# Patient Record
Sex: Female | Born: 1980 | Hispanic: Yes | Marital: Single | Smoking: Never smoker
Health system: Southern US, Community
[De-identification: ages and names within clinical notes are randomized; demographics above are authoritative.]

## PROBLEM LIST (undated history)

## (undated) ENCOUNTER — Inpatient Hospital Stay (HOSPITAL_COMMUNITY): Payer: Self-pay

## (undated) DIAGNOSIS — R079 Chest pain, unspecified: Secondary | ICD-10-CM

## (undated) DIAGNOSIS — E079 Disorder of thyroid, unspecified: Secondary | ICD-10-CM

## (undated) DIAGNOSIS — I8390 Asymptomatic varicose veins of unspecified lower extremity: Secondary | ICD-10-CM

## (undated) DIAGNOSIS — N390 Urinary tract infection, site not specified: Secondary | ICD-10-CM

## (undated) DIAGNOSIS — E039 Hypothyroidism, unspecified: Secondary | ICD-10-CM

## (undated) DIAGNOSIS — IMO0002 Reserved for concepts with insufficient information to code with codable children: Secondary | ICD-10-CM

## (undated) DIAGNOSIS — M329 Systemic lupus erythematosus, unspecified: Secondary | ICD-10-CM

## (undated) DIAGNOSIS — D649 Anemia, unspecified: Secondary | ICD-10-CM

## (undated) DIAGNOSIS — G43909 Migraine, unspecified, not intractable, without status migrainosus: Secondary | ICD-10-CM

## (undated) HISTORY — DX: Asymptomatic varicose veins of unspecified lower extremity: I83.90

## (undated) HISTORY — DX: Systemic lupus erythematosus, unspecified: M32.9

## (undated) HISTORY — PX: TONSILLECTOMY: SUR1361

## (undated) HISTORY — DX: Chest pain, unspecified: R07.9

## (undated) HISTORY — DX: Anemia, unspecified: D64.9

## (undated) HISTORY — DX: Disorder of thyroid, unspecified: E07.9

## (undated) HISTORY — PX: APPENDECTOMY: SHX54

## (undated) HISTORY — PX: TONSILLECTOMY AND ADENOIDECTOMY: SHX28

## (undated) HISTORY — DX: Urinary tract infection, site not specified: N39.0

## (undated) HISTORY — DX: Migraine, unspecified, not intractable, without status migrainosus: G43.909

---
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
## 2006-11-11 ENCOUNTER — Ambulatory Visit: Payer: Self-pay | Admitting: Family Medicine

## 2006-11-11 LAB — CONVERTED CEMR LAB
T3, Free: 3.1 pg/mL (ref 2.3–4.2)
TSH: 9.52 microintl units/mL — ABNORMAL HIGH (ref 0.35–5.50)

## 2007-01-19 ENCOUNTER — Ambulatory Visit: Payer: Self-pay | Admitting: Family Medicine

## 2007-02-17 ENCOUNTER — Ambulatory Visit: Payer: Self-pay | Admitting: Family Medicine

## 2007-02-17 LAB — CONVERTED CEMR LAB: TSH: 2.05 microintl units/mL (ref 0.35–5.50)

## 2007-03-04 ENCOUNTER — Inpatient Hospital Stay (HOSPITAL_COMMUNITY): Admission: AD | Admit: 2007-03-04 | Discharge: 2007-03-05 | Payer: Self-pay | Admitting: Obstetrics and Gynecology

## 2007-03-11 ENCOUNTER — Inpatient Hospital Stay (HOSPITAL_COMMUNITY): Admission: AD | Admit: 2007-03-11 | Discharge: 2007-03-11 | Payer: Self-pay | Admitting: Obstetrics and Gynecology

## 2011-01-02 ENCOUNTER — Emergency Department (HOSPITAL_COMMUNITY)
Admission: EM | Admit: 2011-01-02 | Discharge: 2011-01-02 | Disposition: A | Discharge status: Home / Self Care | Payer: Self-pay | Attending: Emergency Medicine | Admitting: Emergency Medicine

## 2011-01-02 DIAGNOSIS — E039 Hypothyroidism, unspecified: Secondary | ICD-10-CM | POA: Insufficient documentation

## 2011-01-02 DIAGNOSIS — G43909 Migraine, unspecified, not intractable, without status migrainosus: Secondary | ICD-10-CM | POA: Insufficient documentation

## 2011-02-15 NOTE — Assessment & Plan Note (Signed)
Debbie [name] OFFICE NOTE   NAME:Debbie [name], OTILLIA                          MRN:          [id number]  DATE:11/11/2006                            DOB:          03/09/1981    This is a 30 year old woman here to establish with our practice.  She is  also asking for me to evaluate her thyroid gland.  Over the past 6  months or so she has noticed an increasing frequency in her headaches,  some generalized fatigue, and some mild weight gain.  No other specific  symptoms have been noted.  She just moved to [place] from [place],  [place] earlier this year.  About 2 weeks ago she had some screening  laboratories done by her physician in [place] and was told that her TSH  was very high and that she would need to be evaluated by her new  doctor once she arrived here.  Apparently the remainder of her blood  work was within normal limits.   OTHER PAST MEDICAL HISTORY:  She has been treated for depression in the  past but says this is now well controlled.  She had asthma as a child  but grew out of it.  She has a history of migraine headaches and was  given Imitrex to take by her previous physician.  She has a history of  anemia which apparently is stable.  She has had an appendectomy and a  tonsillectomy.  Four years ago she fell and injured her left knee,  apparently some significant cartilage damage was done at that time.  She  has been followed by Dr. Darlina Rumpf who has actually recommended  arthroscopic surgery to the knee, however, since it does not bother her  that much she is trying to put this off as long as possible.  She is a  G3 P1, last Pap smear was about a year ago.  All of her Pap smears have  been normal.   ALLERGIES:  SULFA.   CURRENT MEDICATIONS:  Imitrex tablets as needed.   HABITS:  She drinks some alcohol, she does not use tobacco.   SOCIAL HISTORY:  She is single, she works in Audiological scientist.   FAMILY  HISTORY:  Remarkable for thyroid problems in an aunt, also  hypertension and diabetes.   OBJECTIVE:  Height 5 foot 2 inches, weight 170, blood pressure 112/68,  pulse 72 and regular.  GENERAL:  She is fairly overweight.  NECK:  Supple without masses or thyromegaly.   ASSESSMENT/PLAN:  1. Introductory visit for this patient who probably has hypothyroidism      from her description.      Will get a thyroid panel including a TSH, a Free T3, and a Free T4,      and will evaluate      further.  2. Migraine headaches, apparently stable.     Tera Mater. Clent Ridges, MD  Electronically Signed    SAF/MedQ  DD: 11/12/2006  DT: 11/12/2006  Job #: [id number]

## 2011-02-18 ENCOUNTER — Encounter: Payer: Self-pay | Admitting: Family Medicine

## 2011-02-18 ENCOUNTER — Ambulatory Visit (INDEPENDENT_AMBULATORY_CARE_PROVIDER_SITE_OTHER): Payer: BC Managed Care – PPO | Admitting: Family Medicine

## 2011-02-18 DIAGNOSIS — J309 Allergic rhinitis, unspecified: Secondary | ICD-10-CM

## 2011-02-18 DIAGNOSIS — E039 Hypothyroidism, unspecified: Secondary | ICD-10-CM

## 2011-02-18 DIAGNOSIS — G43909 Migraine, unspecified, not intractable, without status migrainosus: Secondary | ICD-10-CM | POA: Insufficient documentation

## 2011-02-18 DIAGNOSIS — J3089 Other allergic rhinitis: Secondary | ICD-10-CM | POA: Insufficient documentation

## 2011-02-18 MED ORDER — LEVOTHYROXINE SODIUM 125 MCG PO TABS: 125.0000 ug | ORAL_TABLET | Freq: Every day | ORAL | Status: DC

## 2011-02-18 MED ORDER — AZELASTINE HCL 0.1 % NA SOLN: 1.0000 | Freq: Two times a day (BID) | NASAL | Status: DC

## 2011-02-18 MED ORDER — LEVOTHYROXINE SODIUM 125 MCG PO CAPS: 1.0000 | ORAL_CAPSULE | ORAL | Status: DC

## 2011-02-18 NOTE — Patient Instructions (Signed)
Relpax one at onset of migraine and may repeat once in 2 hours but no more than 2 in 24 hours.

## 2011-02-18 NOTE — Progress Notes (Signed)
  Subjective:    Patient ID: Debbie [name], female    DOB: 1981-09-23, 30 y.o.   MRN: [id number]  HPI Patient is seen to establish care. She has remote history of anemia and depression. She has history of hypothyroidism. Had been on Synthroid 125 mcg daily and had run out of insurance and has not been taking for 3 months now. Compliant prior to that time. Thyroid reportedly controlled at that level.  Also has history of migraine headaches. No clear trigger. Usually has one or 2 per month. The pain is throbbing and sometimes associated with nausea and vomiting. Has tried Motrin without relief.  Patient has seasonal and perennial allergies. Has used Claritin, Zyrtec, and Flonase without much improvement. Frequent sneezing and clear rhinorrhea. Occasional eye symptoms. No recent fever or chills.  Some recent weight gain possibly related to hypothyroidism. No dietary changes. No consistent exercise.  Family history significant for father having prostate cancer. Mother and father with hypertension. Father type 2 diabetes.  Patient is single. 2 children ages 78 and 2. Nonsmoker. Occasional alcohol use.   Review of Systems  Constitutional: Positive for fatigue and unexpected weight change. Negative for fever, chills, activity change and appetite change.  HENT: Positive for congestion, sneezing, postnasal drip and sinus pressure. Negative for sore throat and voice change.   Respiratory: Negative for cough, shortness of breath and wheezing.   Cardiovascular: Negative for chest pain, palpitations and leg swelling.  Gastrointestinal: Negative for abdominal pain.  Genitourinary: Negative for dysuria.  Neurological: Negative for dizziness, syncope and headaches.  Hematological: Negative for adenopathy. Does not bruise/bleed easily.  Psychiatric/Behavioral: Negative for dysphoric mood.       Objective:   Physical Exam  Constitutional: She appears well-developed and well-nourished.  HENT:  Right Ear:  External ear normal.  Left Ear: External ear normal.  Mouth/Throat: Oropharynx is clear and moist. No oropharyngeal exudate.  Neck: Neck supple. No thyromegaly present.  Cardiovascular: Normal rate, regular rhythm and normal heart sounds.   No murmur heard. Pulmonary/Chest: Effort normal and breath sounds normal. No respiratory distress. She has no wheezes.  Lymphadenopathy:    She has no cervical adenopathy.  Psychiatric: She has a normal mood and affect.          Assessment & Plan:  #1 hypothyroidism. Get back on thyroid replacement and recheck levels in 3 months #2 migraine headaches. Samples of Relpax 40 mg one at onset of migraine a repeat one in 2 hours as needed maximum 2 in 24 hours. #3 seasonal and perennial allergies.  Astelin nasal one spray per nostril twice daily #4 Weight gain- schedule CPE.  Discussed diet and exercise.

## 2011-03-25 ENCOUNTER — Telehealth: Payer: Self-pay | Admitting: Family Medicine

## 2011-03-25 NOTE — Telephone Encounter (Signed)
Pt had some labs drawn at work for [employer] and some of results were abnormal. Pt req call back from pcp.

## 2011-03-25 NOTE — Telephone Encounter (Signed)
Message left on pt personally identified VM that she may fax lab results to [phone] and attn: Dr Caryl Never and he can give his opinion.

## 2011-04-10 ENCOUNTER — Emergency Department (HOSPITAL_BASED_OUTPATIENT_CLINIC_OR_DEPARTMENT_OTHER)
Admission: EM | Admit: 2011-04-10 | Discharge: 2011-04-11 | Disposition: A | Discharge status: Home / Self Care | Payer: BC Managed Care – PPO | Attending: Emergency Medicine | Admitting: Emergency Medicine

## 2011-04-10 ENCOUNTER — Encounter (HOSPITAL_BASED_OUTPATIENT_CLINIC_OR_DEPARTMENT_OTHER): Payer: Self-pay | Admitting: *Deleted

## 2011-04-10 DIAGNOSIS — R1110 Vomiting, unspecified: Secondary | ICD-10-CM | POA: Insufficient documentation

## 2011-04-10 DIAGNOSIS — R109 Unspecified abdominal pain: Secondary | ICD-10-CM | POA: Insufficient documentation

## 2011-04-10 DIAGNOSIS — K5289 Other specified noninfective gastroenteritis and colitis: Secondary | ICD-10-CM

## 2011-04-10 DIAGNOSIS — R197 Diarrhea, unspecified: Secondary | ICD-10-CM | POA: Insufficient documentation

## 2011-04-10 LAB — URINALYSIS, ROUTINE W REFLEX MICROSCOPIC
Bilirubin Urine: NEGATIVE
Glucose, UA: NEGATIVE mg/dL
Ketones, ur: NEGATIVE mg/dL
Protein, ur: NEGATIVE mg/dL
pH: 6 (ref 5.0–8.0)

## 2011-04-10 LAB — URINE MICROSCOPIC-ADD ON

## 2011-04-10 LAB — PREGNANCY, URINE: Preg Test, Ur: NEGATIVE

## 2011-04-10 MED ORDER — SODIUM CHLORIDE 0.9 % IV BOLUS (SEPSIS)
1000.0000 mL | Freq: Once | INTRAVENOUS | Status: AC
  Administered 2011-04-10: 1000 mL via INTRAVENOUS

## 2011-04-10 NOTE — ED Notes (Signed)
Pt c/o generalized abd pain with loose stools x 2 days

## 2011-04-11 LAB — COMPREHENSIVE METABOLIC PANEL
ALT: 6 U/L (ref 0–35)
BUN: 14 mg/dL (ref 6–23)
CO2: 25 mEq/L (ref 19–32)
Calcium: 9 mg/dL (ref 8.4–10.5)
Creatinine, Ser: 0.7 mg/dL (ref 0.50–1.10)
GFR calc Af Amer: >60 mL/min (ref >60)
GFR calc non Af Amer: >60 mL/min (ref >60)
Glucose, Bld: 121 mg/dL — ABNORMAL HIGH (ref 70–99)
Sodium: 137 mEq/L (ref 135–145)

## 2011-04-11 LAB — CBC
HCT: 31.1 % — ABNORMAL LOW (ref 36.0–46.0)
Hemoglobin: 10.9 g/dL — ABNORMAL LOW (ref 12.0–15.0)
MCH: 21.9 pg — ABNORMAL LOW (ref 26.0–34.0)
MCHC: 35 g/dL (ref 30.0–36.0)

## 2011-04-11 LAB — DIFFERENTIAL
Basophils Relative: 0 % (ref 0–1)
Eosinophils Absolute: 0.3 10*3/uL (ref 0.0–0.7)
Lymphs Abs: 2.7 10*3/uL (ref 0.7–4.0)
Monocytes Absolute: 1 10*3/uL (ref 0.1–1.0)
Neutro Abs: 5.1 10*3/uL (ref 1.7–7.7)

## 2011-04-11 MED ORDER — MORPHINE SULFATE 4 MG/ML IJ SOLN
4.0000 mg | Freq: Once | INTRAMUSCULAR | Status: AC
  Administered 2011-04-11: 4 mg via INTRAVENOUS

## 2011-04-11 MED ORDER — ONDANSETRON HCL 4 MG/2ML IJ SOLN
4.0000 mg | Freq: Once | INTRAMUSCULAR | Status: AC
  Administered 2011-04-11: 4 mg via INTRAVENOUS

## 2011-04-11 MED ORDER — MORPHINE SULFATE 4 MG/ML IJ SOLN
INTRAMUSCULAR | Status: AC
  Administered 2011-04-11: 4 mg via INTRAVENOUS
  Filled 2011-04-11: qty 1

## 2011-04-11 MED ORDER — ONDANSETRON HCL 4 MG PO TABS: 4.0000 mg | ORAL_TABLET | Freq: Four times a day (QID) | ORAL | Status: DC

## 2011-04-11 MED ORDER — ONDANSETRON HCL 4 MG/2ML IJ SOLN
INTRAMUSCULAR | Status: AC
  Administered 2011-04-11: 4 mg
  Filled 2011-04-11: qty 2

## 2011-04-11 MED ORDER — SODIUM CHLORIDE 0.9 % IV BOLUS (SEPSIS)
1000.0000 mL | Freq: Once | INTRAVENOUS | Status: AC
  Administered 2011-04-11: 1000 mL via INTRAVENOUS

## 2011-04-11 MED ORDER — MORPHINE SULFATE 4 MG/ML IJ SOLN
INTRAMUSCULAR | Status: AC
  Administered 2011-04-11: 4 mg
  Filled 2011-04-11: qty 1

## 2011-04-11 MED ORDER — ONDANSETRON HCL 4 MG PO TABS: 4.0000 mg | ORAL_TABLET | Freq: Four times a day (QID) | ORAL | Status: AC

## 2011-04-11 MED ORDER — MORPHINE SULFATE 4 MG/ML IJ SOLN
INTRAMUSCULAR | Status: AC
  Filled 2011-04-11: qty 1

## 2011-04-11 MED ORDER — ONDANSETRON HCL 4 MG/2ML IJ SOLN
INTRAMUSCULAR | Status: AC
  Administered 2011-04-11: 4 mg via INTRAVENOUS
  Filled 2011-04-11: qty 2

## 2011-04-11 NOTE — ED Provider Notes (Signed)
History     Chief Complaint  Patient presents with  . Abdominal Pain   HPI History of Present Illness   Patient Identification Debbie [name] is a 30 y.o. female.  Patient information was obtained from patient. History/Exam limitations: none. Patient presented to the Emergency Department ambulatory.  Chief Complaint  Abdominal Pain   Patient presents for evaluation of abdominal pain, cramping, vomiting and diarrhea. Onset of symptoms was abrupt starting several hours ago, and has been stable since that time. Other family members affected - patient and none. There is no prior history of gastrointestinal disease. No known risk factors for parasitic or bacterial infection. Pt has had multiple episodes of emesis- nonbloody/nonbilious, loose stools- no blood or mucous.  Pt has cramping abdominal pain that is temporarily relieved by vomiting or passing stool.  No fever.  Did try pepto bismol which did not help with symptoms  Past Medical History  Diagnosis Date  . Anemia   . Depression   . Migraines   . Thyroid disease   . UTI (urinary tract infection)   . Migraine    Family History  Problem Relation Age of Onset  . Arthritis Neg Hx     family  . Hypertension Mother   . Hyperlipidemia Father   . Diabetes Father   . Hypertension Father    Scheduled Meds:   Continuous Infusions:   PRN Meds:    Allergies  Allergen Reactions  . Aspirin Hives   History   Social History  . Marital Status: Single    Spouse Name: N/A    Number of Children: N/A  . Years of Education: N/A   Occupational History  . Not on file.   Social History Main Topics  . Smoking status: Never Smoker   . Smokeless tobacco: Not on file  . Alcohol Use: No  . Drug Use: No  . Sexually Active:    Other Topics Concern  . Not on file   Social History Narrative  . No narrative on file   Review of Systems Pertinent items are noted in HPI.  10 Systems reviewed and are negative for acute change  except as noted in the HPI.  Physical Exam   BP 98/54  Pulse 75  Temp(Src) 98.9 F (37.2 C) (Oral)  Resp 18  Wt 175 lb (79.379 kg)  SpO2 100%  LMP 03/19/2011 Vital signs reviewed BP 98/54  Pulse 75  Temp(Src) 98.9 F (37.2 C) (Oral)  Resp 18  Wt 175 lb (79.379 kg)  SpO2 100%  LMP 03/19/2011 General appearance: alert, appears stated age, no distress and tired appearing Eyes: negative findings: conjunctivae and sclerae normal, pupils equal, round, reactive to light and accomodation and no scleral icterus Throat: normal findings: palate normal, tongue midline and normal and soft palate, uvula, and tonsils normal, mucous membranes moist Neck: supple, from Lungs: clear to auscultation bilaterally Heart: regular rate and rhythm, S1, S2 normal, no murmur, click, rub or gallop Abdomen: soft, non-tender; bowel sounds normal; no masses,  no organomegaly Extremities: extremities normal, atraumatic, no cyanosis or edema Pulses: 2+ and symmetric Skin: Skin color, texture, turgor normal. No rashes or lesions Neurologic: Alert and oriented X 3, normal strength and tone. Normal symmetric reflexes. Normal coordination and gait  ED Course     Records Reviewed: Nursing notes.  Treatments: Antiemetics given. Intravenous 0.9NS bolus of 2000 ml given. Oral fluids given and tolerated.  Disposition: Home  Past Medical History  Diagnosis Date  . Anemia   .  Depression   . Migraines   . Thyroid disease   . UTI (urinary tract infection)   . Migraine     Past Surgical History  Procedure Date  . Appendectomy   . Tonsillectomy and adenoidectomy   . Tonsillectomy 2001  . Tonsillectomy     Family History  Problem Relation Age of Onset  . Arthritis Neg Hx     family  . Hypertension Mother   . Hyperlipidemia Father   . Diabetes Father   . Hypertension Father     History  Substance Use Topics  . Smoking status: Never Smoker   . Smokeless tobacco: Not on file  . Alcohol Use: No     OB History    Grav Para Term Preterm Abortions TAB SAB Ect Mult Living                  Review of Systems  Physical Exam  BP 117/69  Pulse 80  Temp(Src) 98.9 F (37.2 C) (Oral)  Resp 16  Wt 175 lb (79.379 kg)  SpO2 100%  LMP 03/19/2011  Physical Exam  ED Course  Procedures Urine sample appears contaminated, mild hyperglycemia, mild anemia- no prior values to compare, otherwise labs reassuring Pt has received 2 L IV hydration.  Will attempt po challenge Pt had vomiting after drinking small amount of sprite- will continue hydration and another dose of antiemetics Pt now feeling improved, has tolerated fluid trial.  MDM Acute onset of nausea/vomiting/diarrhea, cramping abdominal pain.  Abdomen nontender to palpation.  Labs reassuring, suspect gastroenteritis, doubt SBO or other acute/serious process. Pt discharged with strict return precautions.  Pt agreeable with plan.

## 2011-05-02 ENCOUNTER — Encounter (HOSPITAL_BASED_OUTPATIENT_CLINIC_OR_DEPARTMENT_OTHER): Payer: Self-pay | Admitting: *Deleted

## 2011-05-02 ENCOUNTER — Emergency Department (HOSPITAL_BASED_OUTPATIENT_CLINIC_OR_DEPARTMENT_OTHER)
Admission: EM | Admit: 2011-05-02 | Discharge: 2011-05-02 | Disposition: A | Discharge status: Home / Self Care | Payer: BC Managed Care – PPO | Attending: Emergency Medicine | Admitting: Emergency Medicine

## 2011-05-02 DIAGNOSIS — N390 Urinary tract infection, site not specified: Secondary | ICD-10-CM

## 2011-05-02 DIAGNOSIS — R300 Dysuria: Secondary | ICD-10-CM | POA: Insufficient documentation

## 2011-05-02 LAB — URINE MICROSCOPIC-ADD ON

## 2011-05-02 LAB — URINALYSIS, ROUTINE W REFLEX MICROSCOPIC
Glucose, UA: NEGATIVE mg/dL
Protein, ur: NEGATIVE mg/dL

## 2011-05-02 LAB — PREGNANCY, URINE: Preg Test, Ur: NEGATIVE

## 2011-05-02 MED ORDER — IBUPROFEN 800 MG PO TABS
800.0000 mg | ORAL_TABLET | Freq: Once | ORAL | Status: AC
  Administered 2011-05-02: 800 mg via ORAL
  Filled 2011-05-02: qty 1

## 2011-05-02 MED ORDER — NITROFURANTOIN MONOHYD MACRO 100 MG PO CAPS: 100.0000 mg | ORAL_CAPSULE | Freq: Two times a day (BID) | ORAL | Status: DC

## 2011-05-02 MED ORDER — NITROFURANTOIN MONOHYD MACRO 100 MG PO CAPS: 100.0000 mg | ORAL_CAPSULE | Freq: Two times a day (BID) | ORAL | Status: AC

## 2011-05-02 NOTE — ED Provider Notes (Signed)
Medical screening examination/treatment/procedure(s) were performed by non-physician practitioner and as supervising physician I was immediately available for consultation/collaboration.   Charles B. Sheldon, MD 05/02/11 [id number] 

## 2011-05-02 NOTE — ED Notes (Signed)
Dysuria x 3 days 

## 2011-05-02 NOTE — ED Provider Notes (Signed)
History     CSN: [id number] Arrival date & time: 05/02/2011  6:17 PM  Chief Complaint  Patient presents with  . Dysuria   Patient is a 30 y.o. female presenting with dysuria. The history is provided by the patient.  Dysuria  This is a new problem. The current episode started yesterday. The problem occurs every urination. The problem has been gradually worsening. The quality of the pain is described as burning and stabbing. The pain is at a severity of 5/10. The pain is moderate. There has been no fever. She is sexually active. There is no history of pyelonephritis. Associated symptoms include nausea, frequency and urgency. Pertinent negatives include no chills, no vomiting, no hematuria and no possible pregnancy. She has tried nothing for the symptoms. Her past medical history is significant for recurrent UTIs.    Past Medical History  Diagnosis Date  . Anemia   . Depression   . Migraines   . Thyroid disease   . UTI (urinary tract infection)   . Migraine     Past Surgical History  Procedure Date  . Appendectomy   . Tonsillectomy and adenoidectomy   . Tonsillectomy 2001  . Tonsillectomy     Family History  Problem Relation Age of Onset  . Arthritis Neg Hx     family  . Hypertension Mother   . Hyperlipidemia Father   . Diabetes Father   . Hypertension Father     History  Substance Use Topics  . Smoking status: Never Smoker   . Smokeless tobacco: Not on file  . Alcohol Use: No    OB History    Grav Para Term Preterm Abortions TAB SAB Ect Mult Living                  Review of Systems  Constitutional: Negative for chills.  Gastrointestinal: Positive for nausea. Negative for vomiting.  Genitourinary: Positive for dysuria, urgency and frequency. Negative for hematuria.  All other systems reviewed and are negative.    Physical Exam  BP 114/65  Pulse 76  Temp(Src) 98.1 F (36.7 C) (Oral)  Resp 20  SpO2 100%  LMP 03/19/2011  Physical Exam  Constitutional:  She is oriented to person, place, and time. She appears well-developed and well-nourished.  HENT:  Head: Normocephalic and atraumatic.  Neck: Normal range of motion. Neck supple.  Cardiovascular: Normal rate.   Pulmonary/Chest: Effort normal.  Abdominal: Soft. Bowel sounds are normal. There is no tenderness.  Musculoskeletal: Normal range of motion.  Neurological: She is alert and oriented to person, place, and time.  Skin: Skin is warm.    ED Course  Procedures  MDM  Pt has a uti,  I will treat, i advised recheck with primary to make sure infection resolves   Results for orders placed during the hospital encounter of 05/02/11  URINALYSIS, ROUTINE W REFLEX MICROSCOPIC      Component Value Range   Color, Urine YELLOW  YELLOW    Appearance CLOUDY (*) CLEAR    Specific Gravity, Urine 1.021  1.005 - 1.030    pH 5.5  5.0 - 8.0    Glucose, UA NEGATIVE  NEGATIVE (mg/dL)   Hgb urine dipstick TRACE (*) NEGATIVE    Bilirubin Urine NEGATIVE  NEGATIVE    Ketones, ur NEGATIVE  NEGATIVE (mg/dL)   Protein, ur NEGATIVE  NEGATIVE (mg/dL)   Urobilinogen, UA 0.2  0.0 - 1.0 (mg/dL)   Nitrite NEGATIVE  NEGATIVE    Leukocytes, UA MODERATE (*)  NEGATIVE   PREGNANCY, URINE      Component Value Range   Preg Test, Ur NEGATIVE    URINE MICROSCOPIC-ADD ON      Component Value Range   Squamous Epithelial / LPF FEW (*) RARE    WBC, UA 7-10  <3 (WBC/hpf)   RBC / HPF 0-2  <3 (RBC/hpf)   Bacteria, UA FEW (*) RARE    No results found.    Langston Masker, [place] 05/02/11 [phone]

## 2011-05-21 ENCOUNTER — Encounter: Payer: Self-pay | Admitting: Family Medicine

## 2011-05-21 ENCOUNTER — Ambulatory Visit (INDEPENDENT_AMBULATORY_CARE_PROVIDER_SITE_OTHER): Payer: BC Managed Care – PPO | Admitting: Family Medicine

## 2011-05-21 VITALS — BP 102/70 | Temp 98.0°F | Wt 172.0 lb

## 2011-05-21 DIAGNOSIS — E039 Hypothyroidism, unspecified: Secondary | ICD-10-CM

## 2011-05-21 DIAGNOSIS — R300 Dysuria: Secondary | ICD-10-CM

## 2011-05-21 LAB — POCT URINALYSIS DIPSTICK
Blood, UA: NEGATIVE
Glucose, UA: NEGATIVE
Nitrite, UA: NEGATIVE
Protein, UA: NEGATIVE
Urobilinogen, UA: 0.2
pH, UA: 7

## 2011-05-21 MED ORDER — LEVOTHYROXINE SODIUM 137 MCG PO CAPS: 1.0000 | ORAL_CAPSULE | ORAL | Status: DC

## 2011-05-21 NOTE — Progress Notes (Signed)
  Subjective:    Patient ID: Debbie [name], female    DOB: June 07, 1981, 30 y.o.   MRN: [id number]  HPI Patient here for the following issues. Recently developed urine frequency and burning with urination. Went to urgent care about one week ago. Prescribed Macrobid and symptoms not improved. Apparently no culture done.  Is sexually active. Denies vaginal discharge. Some vague low back pain. No flank pain. No fever or chills. No history of STD.  Hypothyroidism. Recent screening labs through work with TSH 15. Patient had some increased fatigue and dry skin. Occasional constipation. Dysphoric mood at times. Receiving counseling through work which is helping. Recently starting exercise program.  The following is reviewed:  Past Medical History  Diagnosis Date  . Anemia   . Depression   . Migraines   . Thyroid disease   . UTI (urinary tract infection)   . Migraine    Past Surgical History  Procedure Date  . Appendectomy   . Tonsillectomy and adenoidectomy   . Tonsillectomy 2001  . Tonsillectomy     reports that she has never smoked. She does not have any smokeless tobacco history on file. She reports that she does not drink alcohol or use illicit drugs. family history includes Diabetes in her father; Hyperlipidemia in her father; and Hypertension in her father and mother.  There is no history of Arthritis. Allergies  Allergen Reactions  . Aspirin Hives      Review of Systems  Constitutional: Positive for fatigue. Negative for fever and chills.  Respiratory: Negative for cough.   Gastrointestinal: Negative for abdominal pain.  Genitourinary: Positive for dysuria and frequency. Negative for vaginal discharge and genital sores.  Hematological: Negative for adenopathy.       Objective:   Physical Exam  Constitutional: She is oriented to person, place, and time. She appears well-developed and well-nourished. No distress.  HENT:  Right Ear: External ear normal.  Left Ear: External  ear normal.  Mouth/Throat: Oropharynx is clear and moist. No oropharyngeal exudate.  Neck: Neck supple. No thyromegaly present.  Cardiovascular: Normal rate, regular rhythm and normal heart sounds.   No murmur heard. Pulmonary/Chest: Effort normal and breath sounds normal. No respiratory distress. She has no wheezes. She has no rales.  Musculoskeletal: She exhibits no edema.  Lymphadenopathy:    She has no cervical adenopathy.  Neurological: She is alert and oriented to person, place, and time.          Assessment & Plan:  #1 dysuria. Repeat urine assessment. Screen for chlamydia and GC.  #2 hypothyroidism under treated. Titrate levothyroxin 137 micrograms daily and reassess thyroid 3 months #3 health maintenance. 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 No Pap smear over 1 year. Schedule Pap smear in 3 months

## 2011-05-23 LAB — URINE CULTURE: Colony Count: 75000

## 2011-05-24 NOTE — Progress Notes (Signed)
Quick Note:  "My wife is at work and cannot answer her cell phone, husband informed all urine testing came back negative" ______

## 2011-05-24 NOTE — Progress Notes (Signed)
Quick Note:  Pt husband informed "all urine testing came back negative", pt is at work and cannot answer her cell phone. ______

## 2011-05-28 ENCOUNTER — Telehealth: Payer: Self-pay | Admitting: Family Medicine

## 2011-05-28 NOTE — Telephone Encounter (Signed)
Opened in error

## 2011-05-30 ENCOUNTER — Ambulatory Visit: Payer: BC Managed Care – PPO | Admitting: Family Medicine

## 2011-05-30 DIAGNOSIS — Z029 Encounter for administrative examinations, unspecified: Secondary | ICD-10-CM

## 2011-06-20 ENCOUNTER — Encounter (HOSPITAL_BASED_OUTPATIENT_CLINIC_OR_DEPARTMENT_OTHER): Payer: Self-pay | Admitting: *Deleted

## 2011-06-20 ENCOUNTER — Emergency Department (HOSPITAL_BASED_OUTPATIENT_CLINIC_OR_DEPARTMENT_OTHER)
Admission: EM | Admit: 2011-06-20 | Discharge: 2011-06-21 | Disposition: A | Discharge status: Home / Self Care | Payer: BC Managed Care – PPO | Attending: Emergency Medicine | Admitting: Emergency Medicine

## 2011-06-20 DIAGNOSIS — H9201 Otalgia, right ear: Secondary | ICD-10-CM

## 2011-06-20 DIAGNOSIS — E079 Disorder of thyroid, unspecified: Secondary | ICD-10-CM | POA: Insufficient documentation

## 2011-06-20 DIAGNOSIS — H9209 Otalgia, unspecified ear: Secondary | ICD-10-CM | POA: Insufficient documentation

## 2011-06-20 DIAGNOSIS — J069 Acute upper respiratory infection, unspecified: Secondary | ICD-10-CM | POA: Insufficient documentation

## 2011-06-20 NOTE — ED Notes (Signed)
Pt c/o right ear pain and dizzy spells x3 days.

## 2011-06-21 ENCOUNTER — Ambulatory Visit (INDEPENDENT_AMBULATORY_CARE_PROVIDER_SITE_OTHER): Payer: BC Managed Care – PPO | Admitting: Family Medicine

## 2011-06-21 ENCOUNTER — Encounter: Payer: Self-pay | Admitting: Family Medicine

## 2011-06-21 DIAGNOSIS — J309 Allergic rhinitis, unspecified: Secondary | ICD-10-CM

## 2011-06-21 MED ORDER — IBUPROFEN 800 MG PO TABS: 800.0000 mg | ORAL_TABLET | Freq: Three times a day (TID) | ORAL | Status: AC | PRN

## 2011-06-21 MED ORDER — IBUPROFEN 800 MG PO TABS
800.0000 mg | ORAL_TABLET | Freq: Once | ORAL | Status: AC
  Administered 2011-06-21: 800 mg via ORAL
  Filled 2011-06-21: qty 1

## 2011-06-21 MED ORDER — AZELASTINE HCL 0.1 % NA SOLN: 1.0000 | Freq: Two times a day (BID) | NASAL | Status: DC

## 2011-06-21 MED ORDER — LEVOTHYROXINE SODIUM 137 MCG PO TABS: 137.0000 ug | ORAL_TABLET | Freq: Every day | ORAL | Status: DC

## 2011-06-21 MED ORDER — ANTIPYRINE-BENZOCAINE 5.4-1.4 % OT SOLN
3.0000 [drp] | Freq: Once | OTIC | Status: AC
  Administered 2011-06-21: 4 [drp] via OTIC
  Filled 2011-06-21: qty 10

## 2011-06-21 MED ORDER — AMOXICILLIN 875 MG PO TABS: 875.0000 mg | ORAL_TABLET | Freq: Two times a day (BID) | ORAL | Status: AC

## 2011-06-21 NOTE — Progress Notes (Signed)
  Subjective:    Patient ID: Debbie [name], female    DOB: 08-15-1981, 30 y.o.   MRN: [id number]  HPI Patient seen for upper respiratory infection. Onset about 4-5 days ago. Mostly has facial pain and ethmoid and maxillary sinus region. Nasal congestion. Has tried Sudafed and Tylenol without much relief. Went to the emergency room. Was given anesthetic ear drops which helps with her right ear pain. No purulent discharge.  No documented fever. Denies cough. No nausea, vomiting, or diarrhea.  Patient also requesting refills of her levothyroxin. She is due for repeat lab work in November.   Review of Systems  Constitutional: Negative for fever and chills.  HENT: Positive for congestion and sinus pressure. Negative for sore throat, trouble swallowing and voice change.   Respiratory: Negative for cough and shortness of breath.   Neurological: Positive for headaches. Negative for dizziness.       Objective:   Physical Exam  Constitutional: She appears well-developed and well-nourished. No distress.  HENT:  Right Ear: External ear normal.  Left Ear: External ear normal.  Mouth/Throat: Oropharynx is clear and moist.  Neck: Neck supple. No thyromegaly present.  Cardiovascular: Normal rate and regular rhythm.   Pulmonary/Chest: Effort normal and breath sounds normal. No respiratory distress. She has no wheezes. She has no rales.  Lymphadenopathy:    She has no cervical adenopathy.          Assessment & Plan:  URI. Get back on Astelin. Continue Sudafed. Motrin 800 mg one every 8 hours as needed. At this point no antibiotics with prescription for amoxicillin 875 mg twice a day if she develops any purulent secretions or worsening symptoms

## 2011-06-21 NOTE — ED Provider Notes (Signed)
History     CSN: [id number] Arrival date & time: 06/20/2011 11:39 PM  Chief Complaint  Patient presents with  . Otalgia    HPI  (Consider location/radiation/quality/duration/timing/severity/associated sxs/prior treatment)  HPI Pt presents with c/o right ear pain.  Has had nasal congestion and URI symptoms over the past 3 days with right ear pain.  No fever, no drainage or redness of ear.  Tried ibuprofen approx 11am this morning with transeint relief.  No difficulty breathing or swallowing.  Had sore throat at the outset of symptoms but none now.   Past Medical History  Diagnosis Date  . Anemia   . Depression   . Migraines   . Thyroid disease   . UTI (urinary tract infection)   . Migraine     Past Surgical History  Procedure Date  . Appendectomy   . Tonsillectomy and adenoidectomy   . Tonsillectomy 2001  . Tonsillectomy     Family History  Problem Relation Age of Onset  . Arthritis Neg Hx     family  . Hypertension Mother   . Hyperlipidemia Father   . Diabetes Father   . Hypertension Father     History  Substance Use Topics  . Smoking status: Never Smoker   . Smokeless tobacco: Not on file  . Alcohol Use: No    OB History    Grav Para Term Preterm Abortions TAB SAB Ect Mult Living                  Review of Systems  Review of Systems ROS reviewed and otherwise negative except for mentioned in HPI  Allergies  Aspirin  Home Medications   Current Outpatient Rx  Name Route Sig Dispense Refill  . AZELASTINE HCL 137 MCG/SPRAY NA SOLN Nasal 1 spray by Nasal route 2 (two) times daily. Use in each nostril as directed 30 mL 12  . LEVONORGESTREL 20 MCG/24HR IU IUD Intrauterine 1 each by Intrauterine route once.      Marland Kitchen LEVOTHYROXINE SODIUM 137 MCG PO TABS Oral Take 137 mcg by mouth daily.      Marland Kitchen LEVOTHYROXINE SODIUM 137 MCG PO CAPS Oral Take 1 capsule (137 mcg total) by mouth 1 day or 1 dose. 30 capsule 5  . NITROFURANTOIN MONOHYD MACRO 100 MG PO CAPS          Physical Exam    BP 113/61  Pulse 82  Temp(Src) 98.3 F (36.8 C) (Oral)  Resp 18  SpO2 100%  LMP 05/23/2011 Vital signs reviewed by me Physical Exam Physical Examination: General appearance - alert, well appearing, and in no distress Ears - bilateral TM's and external ear canals normal, although right TM retracted appearing Mouth - mucous membranes moist, pharynx normal without lesions Neck - supple, no significant adenopathy Chest - clear to auscultation, no wheezes, rales or rhonchi, symmetric air entry Heart - normal rate, regular rhythm, normal S1, S2, no murmurs, rubs, clicks or gallops Abdomen - soft, nontender, nondistended, no masses or organomegaly Musculoskeletal - no joint tenderness, deformity or swelling Skin - normal coloration and turgor, no rashes, no suspicious skin lesions noted ED Course  Procedures (including critical care time)  Labs Reviewed - No data to display No results found.   No diagnosis found.   MDM Pt with no findings of OM or OE, Tm on right does appear retracted.  Recommend ibuprofen, given auralgan for comfort.  Also recommended OTC decongestant for URI symptoms.  Discharged with strict return precautions. Pt agreeable  with plan.         Ethelda Chick, MD 06/21/11 [phone]

## 2011-07-18 LAB — URINALYSIS, ROUTINE W REFLEX MICROSCOPIC
Glucose, UA: NEGATIVE
Hgb urine dipstick: NEGATIVE
Hgb urine dipstick: NEGATIVE
Nitrite: NEGATIVE
Nitrite: NEGATIVE
Protein, ur: NEGATIVE
Specific Gravity, Urine: >1.03 — ABNORMAL HIGH
Urobilinogen, UA: 0.2
pH: 6
pH: 6

## 2011-07-18 LAB — WET PREP, GENITAL
Clue Cells Wet Prep HPF POC: NONE SEEN
Trich, Wet Prep: NONE SEEN

## 2011-07-18 LAB — URINE MICROSCOPIC-ADD ON

## 2011-07-18 LAB — CBC
MCHC: 32.5
MCV: 69.8 — ABNORMAL LOW
Platelets: 226
WBC: 7.9

## 2011-07-18 LAB — GC/CHLAMYDIA PROBE AMP, GENITAL: GC Probe Amp, Genital: NEGATIVE

## 2011-07-18 LAB — POCT PREGNANCY, URINE: Operator id: 202651

## 2011-07-27 ENCOUNTER — Encounter (HOSPITAL_BASED_OUTPATIENT_CLINIC_OR_DEPARTMENT_OTHER): Payer: Self-pay | Admitting: *Deleted

## 2011-07-27 ENCOUNTER — Emergency Department (HOSPITAL_BASED_OUTPATIENT_CLINIC_OR_DEPARTMENT_OTHER)
Admission: EM | Admit: 2011-07-27 | Discharge: 2011-07-27 | Disposition: A | Discharge status: Home / Self Care | Payer: BC Managed Care – PPO | Attending: Emergency Medicine | Admitting: Emergency Medicine

## 2011-07-27 DIAGNOSIS — R51 Headache: Secondary | ICD-10-CM

## 2011-07-27 DIAGNOSIS — Z79899 Other long term (current) drug therapy: Secondary | ICD-10-CM | POA: Insufficient documentation

## 2011-07-27 DIAGNOSIS — R112 Nausea with vomiting, unspecified: Secondary | ICD-10-CM | POA: Insufficient documentation

## 2011-07-27 DIAGNOSIS — H53149 Visual discomfort, unspecified: Secondary | ICD-10-CM | POA: Insufficient documentation

## 2011-07-27 MED ORDER — DIPHENHYDRAMINE HCL 12.5 MG/5ML PO ELIX: 25.0000 mg | ORAL_SOLUTION | Freq: Once | ORAL | Status: DC

## 2011-07-27 MED ORDER — KETOROLAC TROMETHAMINE 15 MG/ML IJ SOLN
15.0000 mg | Freq: Once | INTRAMUSCULAR | Status: AC
  Administered 2011-07-27: 15 mg via INTRAVENOUS
  Filled 2011-07-27: qty 1

## 2011-07-27 MED ORDER — DIPHENHYDRAMINE HCL 50 MG/ML IJ SOLN
25.0000 mg | Freq: Once | INTRAMUSCULAR | Status: AC
  Administered 2011-07-27: 25 mg via INTRAVENOUS
  Filled 2011-07-27: qty 1

## 2011-07-27 MED ORDER — SODIUM CHLORIDE 0.9 % IV BOLUS (SEPSIS)
1000.0000 mL | Freq: Once | INTRAVENOUS | Status: AC
  Administered 2011-07-27: 1000 mL via INTRAVENOUS

## 2011-07-27 MED ORDER — METOCLOPRAMIDE HCL 5 MG/ML IJ SOLN
10.0000 mg | Freq: Once | INTRAMUSCULAR | Status: AC
  Administered 2011-07-27: 10 mg via INTRAVENOUS
  Filled 2011-07-27: qty 2

## 2011-07-27 NOTE — ED Notes (Signed)
Pt c/o lightheadedness, BP 100/46 Dr Juleen China notified, Dr Juleen China to reassess pt.

## 2011-07-27 NOTE — ED Notes (Signed)
Pt reports headache (has hx of mha) x 5 ; vomited last night

## 2011-07-27 NOTE — ED Provider Notes (Signed)
History  Scribed for Raeford Razor, MD, the patient was seen in [facility]. The chart was scribed by Gilman Schmidt. The patients care was started at 9:51 PM. CSN: [id number] Arrival date & time: 07/27/2011  8:04 PM   First MD Initiated Contact with Patient 07/27/11 2041      Chief Complaint  Patient presents with  . Migraine  . Nausea    HPI Debbie [name] is a 30 y.o. female  who presents to the Emergency Department complaining of migraine onset 5 days. Describes symptoms as throbbing ache in frontal and radiating to right side. Pt has history of migraines but symptoms have never persisted this long. Symptoms have woken her up at night and states that this is atypical. Associated symptoms of phtophobia. Notes that she had stomach virus one week ago. Tried Ibuprofen, ice wraps, and cold showers with no relief. Denies any numbness, weakness, tingling, neck pain, neck stiffness. There are no other associated symptoms and no other alleviating or aggravating factors.     Past Medical History  Diagnosis Date  . Anemia   . Depression   . Migraines   . UTI (urinary tract infection)   . Migraine   . Thyroid disease     Past Surgical History  Procedure Date  . Appendectomy   . Tonsillectomy and adenoidectomy   . Tonsillectomy 2001  . Tonsillectomy     Family History  Problem Relation Age of Onset  . Arthritis Neg Hx     family  . Hypertension Mother   . Hyperlipidemia Father   . Diabetes Father   . Hypertension Father     History  Substance Use Topics  . Smoking status: Never Smoker   . Smokeless tobacco: Not on file  . Alcohol Use: No    OB History    Grav Para Term Preterm Abortions TAB SAB Ect Mult Living                  Review of Systems  Constitutional: Negative for fever.  HENT: Negative for neck pain and neck stiffness.   Eyes: Positive for photophobia.  Gastrointestinal: Positive for nausea and vomiting.  Neurological: Positive for headaches. Negative for  weakness and numbness.    Allergies  Aspirin  Home Medications   Current Outpatient Rx  Name Route Sig Dispense Refill  . IBUPROFEN 800 MG PO TABS Oral Take 800 mg by mouth every 8 (eight) hours as needed. For pain     . LEVOTHYROXINE SODIUM 137 MCG PO TABS Oral Take 1 tablet (137 mcg total) by mouth daily. 90 tablet 3  . AZELASTINE HCL 137 MCG/SPRAY NA SOLN Nasal Place 1 spray into the nose 2 (two) times daily. Use in each nostril as directed 30 mL 12  . LEVONORGESTREL 20 MCG/24HR IU IUD Intrauterine 1 each by Intrauterine route once.      Marland Kitchen LEVOTHYROXINE SODIUM 137 MCG PO CAPS Oral Take 1 capsule (137 mcg total) by mouth 1 day or 1 dose. 30 capsule 5  . NITROFURANTOIN MONOHYD MACRO 100 MG PO CAPS        BP 113/68  Pulse 72  Temp(Src) 98.5 F (36.9 C) (Oral)  Resp 20  Ht 5\' 2"  (1.575 m)  Wt 168 lb (76.204 kg)  BMI 30.73 kg/m2  SpO2 99%  LMP 07/25/2011  Physical Exam  Nursing note and vitals reviewed. Constitutional: She is oriented to person, place, and time. She appears well-developed and well-nourished.  Non-toxic appearance. She does not have a  sickly appearance.  HENT:  Head: Normocephalic and atraumatic.       No facial tenderness  Eyes: Conjunctivae, EOM and lids are normal. Pupils are equal, round, and reactive to light. No scleral icterus.  Neck: Trachea normal and normal range of motion. Neck supple.  Cardiovascular: Regular rhythm and normal heart sounds.   Pulmonary/Chest: Effort normal and breath sounds normal.  Abdominal: Soft. Normal appearance. There is no tenderness. There is no rebound, no guarding and no CVA tenderness.  Musculoskeletal: Normal range of motion.       Strength 5/5  Neurological: She is alert and oriented to person, place, and time. She has normal strength and normal reflexes. She displays normal reflexes. No cranial nerve deficit. She exhibits normal muscle tone. Coordination normal.  Skin: Skin is warm, dry and intact. No rash noted.     ED Course  Procedures  DIAGNOSTIC STUDIES: Oxygen Saturation is 99% on room air, normal by my interpretation.    COORDINATION OF CARE: 9:51PM:  - Patient evaluated by ED physician, Benadryl, Reglan ordered   The encounter diagnosis was Headache.  MDM  30yf with HA. No trauma and nonfocal neuro exam. Hx of similar HAs. Improved after meds with no new complaints. Very low clinical suspicion for secondary cause of HA.    I personally preformed the services scribed in my presence. The recorded information has been reviewed and considered. Raeford Razor, MD.      Raeford Razor, MD 07/30/11 [phone]

## 2011-08-20 ENCOUNTER — Ambulatory Visit: Payer: BC Managed Care – PPO | Admitting: Family Medicine

## 2011-08-20 ENCOUNTER — Telehealth: Payer: Self-pay | Admitting: *Deleted

## 2011-08-20 NOTE — Telephone Encounter (Signed)
Message left for pt on home phone, missed 3 month follow-up visit.  Please call to explain and reschedule

## 2011-09-02 ENCOUNTER — Ambulatory Visit: Payer: BC Managed Care – PPO | Admitting: Family Medicine

## 2011-09-06 ENCOUNTER — Ambulatory Visit (INDEPENDENT_AMBULATORY_CARE_PROVIDER_SITE_OTHER): Payer: BC Managed Care – PPO | Admitting: Family Medicine

## 2011-09-06 ENCOUNTER — Encounter: Payer: Self-pay | Admitting: Family Medicine

## 2011-09-06 VITALS — BP 100/70 | Temp 98.0°F | Wt 172.0 lb

## 2011-09-06 DIAGNOSIS — J329 Chronic sinusitis, unspecified: Secondary | ICD-10-CM

## 2011-09-06 MED ORDER — AMOXICILLIN-POT CLAVULANATE 875-125 MG PO TABS: 1.0000 | ORAL_TABLET | Freq: Two times a day (BID) | ORAL | Status: AC

## 2011-09-06 NOTE — Progress Notes (Signed)
  Subjective:    Patient ID: Debbie [name], female    DOB: 21-Jun-1981, 30 y.o.   MRN: [id number]  HPI  Acute visit. One-week history of illness. She describes frontal sinus pressure, generalized body aches, malaise, chills but no fever. Occasional dry cough. She tried Zyrtec without relief. Has not tried any Sudafed. Denies any nausea or vomiting. No purulent nasal discharge. Patient is nonsmoker. No flu vaccine yet.   Review of Systems  Constitutional: Positive for chills and fatigue. Negative for fever.  HENT: Positive for congestion and sinus pressure. Negative for sore throat and voice change.   Respiratory: Positive for cough. Negative for shortness of breath and wheezing.   Neurological: Positive for headaches. Negative for dizziness.       Objective:   Physical Exam  Constitutional: She appears well-developed and well-nourished. No distress.  HENT:  Right Ear: External ear normal.  Left Ear: External ear normal.  Mouth/Throat: Oropharynx is clear and moist.  Neck: Neck supple.  Cardiovascular: Normal rate and regular rhythm.   Pulmonary/Chest: Effort normal and breath sounds normal. No respiratory distress. She has no wheezes. She has no rales.  Musculoskeletal: She exhibits no edema.  Lymphadenopathy:    She has no cervical adenopathy.          Assessment & Plan:  URI. Suspect viral. Try over-the-counter Sudafed. Prescription printed for Augmentin to start only if she has worsening symptoms over the next several days

## 2011-09-06 NOTE — Patient Instructions (Signed)
Viral Syndrome You or your child has Viral Syndrome. It is the most common infection causing "colds" and infections in the nose, throat, sinuses, and breathing tubes. Sometimes the infection causes nausea, vomiting, or diarrhea. The germ that causes the infection is a virus. No antibiotic or other medicine will kill it. There are medicines that you can take to make you or your child more comfortable.  HOME CARE INSTRUCTIONS   Rest in bed until you start to feel better.   If you have diarrhea or vomiting, eat small amounts of crackers and toast. Soup is helpful.   Do not give aspirin or medicine that contains aspirin to children.   Only take over-the-counter or prescription medicines for pain, discomfort, or fever as directed by your caregiver.  SEEK IMMEDIATE MEDICAL CARE IF:   You or your child has not improved within one week.   You or your child has pain that is not at least partially relieved by over-the-counter medicine.   Thick, colored mucus or blood is coughed up.   Discharge from the nose becomes thick yellow or green.   Diarrhea or vomiting gets worse.   There is any major change in your or your child's condition.   You or your child develops a skin rash, stiff neck, severe headache, or are unable to hold down food or fluid.   You or your child has an oral temperature above 102 F (38.9 C), not controlled by medicine.   Your baby is older than 3 months with a rectal temperature of 102 F (38.9 C) or higher.   Your baby is 61 months old or younger with a rectal temperature of 100.4 F (38 C) or higher.  Document Released: 09/01/2006 Document Revised: 05/29/2011 Document Reviewed: 09/02/2007 [facility] Patient Information 2012 [place], [place].  Try over the counter sudafed as needed for sinus congestion.

## 2011-09-10 ENCOUNTER — Ambulatory Visit (INDEPENDENT_AMBULATORY_CARE_PROVIDER_SITE_OTHER): Payer: BC Managed Care – PPO | Admitting: Family Medicine

## 2011-09-10 ENCOUNTER — Encounter: Payer: Self-pay | Admitting: Family Medicine

## 2011-09-10 ENCOUNTER — Other Ambulatory Visit (HOSPITAL_COMMUNITY)
Admission: RE | Admit: 2011-09-10 | Discharge: 2011-09-10 | Disposition: A | Discharge status: Home / Self Care | Payer: BC Managed Care – PPO | Source: Ambulatory Visit | Attending: Family Medicine | Admitting: Family Medicine

## 2011-09-10 VITALS — BP 110/82 | HR 72 | Temp 98.2°F | Resp 12 | Ht 62.0 in | Wt 172.0 lb

## 2011-09-10 DIAGNOSIS — G43909 Migraine, unspecified, not intractable, without status migrainosus: Secondary | ICD-10-CM

## 2011-09-10 DIAGNOSIS — Z23 Encounter for immunization: Secondary | ICD-10-CM

## 2011-09-10 DIAGNOSIS — Z01419 Encounter for gynecological examination (general) (routine) without abnormal findings: Secondary | ICD-10-CM | POA: Insufficient documentation

## 2011-09-10 DIAGNOSIS — Z0000 Encounter for general adult medical examination without abnormal findings: Secondary | ICD-10-CM

## 2011-09-10 DIAGNOSIS — E039 Hypothyroidism, unspecified: Secondary | ICD-10-CM

## 2011-09-10 LAB — BASIC METABOLIC PANEL
CO2: 28 mEq/L (ref 19–32)
Calcium: 9 mg/dL (ref 8.4–10.5)
GFR: 81.01 mL/min (ref >60.00)
Sodium: 139 mEq/L (ref 135–145)

## 2011-09-10 LAB — CBC WITH DIFFERENTIAL/PLATELET
Basophils Absolute: 0 10*3/uL (ref 0.0–0.1)
HCT: 32.9 % — ABNORMAL LOW (ref 36.0–46.0)
Lymphs Abs: 1.9 10*3/uL (ref 0.7–4.0)
MCV: 69.4 fl — ABNORMAL LOW (ref 78.0–100.0)
Monocytes Absolute: 0.5 10*3/uL (ref 0.1–1.0)
Platelets: 337 10*3/uL (ref 150.0–400.0)
RDW: 18.3 % — ABNORMAL HIGH (ref 11.5–14.6)

## 2011-09-10 LAB — LIPID PANEL
HDL: 42.9 mg/dL (ref >39.00)
Triglycerides: 51 mg/dL (ref 0.0–149.0)

## 2011-09-10 LAB — HEPATIC FUNCTION PANEL: Albumin: 4.3 g/dL (ref 3.5–5.2)

## 2011-09-10 LAB — TSH: TSH: 15.91 u[IU]/mL — ABNORMAL HIGH (ref 0.35–5.50)

## 2011-09-10 MED ORDER — TETANUS-DIPHTH-ACELL PERTUSSIS 5-2.5-18.5 LF-MCG/0.5 IM SUSP: 0.5000 mL | Freq: Once | INTRAMUSCULAR | Status: DC

## 2011-09-10 NOTE — Progress Notes (Signed)
Subjective:    Patient ID: Debbie [name], female    DOB: 30-Apr-1981, 30 y.o.   MRN: [id number]  HPI  Patient here for complete physical and followup regarding medical problems. She has not had Pap smear in a couple years. Questionable history of previous HPV but denies any history of significant abnormality on Pap smears. Last tetanus unknown. No flu vaccine yet. No recent lab work.  History of probable migraine headaches.  This is a new evaluation.  Intermittent headaches which are frequently retro-orbital and throbbing quality associated with nausea but no vomiting. Symptoms last several hours to most of the day. Previously has taken Motrin with occasional relief but not recently. Headaches triggered by stress. No progressive pattern. She has IUD. No clear correlation previously with menses. No clear food allergies.  No focal neurologic symptoms.  Hypothyroidism treated with Levothroid 137 mcg. Compliant with therapy  Past Medical History  Diagnosis Date  . Anemia   . Depression   . Migraines   . UTI (urinary tract infection)   . Migraine   . Thyroid disease    Past Surgical History  Procedure Date  . Appendectomy   . Tonsillectomy and adenoidectomy   . Tonsillectomy 2001  . Tonsillectomy     reports that she has never smoked. She does not have any smokeless tobacco history on file. She reports that she does not drink alcohol or use illicit drugs. family history includes Diabetes in her father; Hyperlipidemia in her father; and Hypertension in her father and mother.  There is no history of Arthritis. Allergies  Allergen Reactions  . Aspirin Hives     Review of Systems  Constitutional: Negative for fever, activity change, appetite change, fatigue and unexpected weight change.  HENT: Negative for hearing loss, ear pain, sore throat and trouble swallowing.   Eyes: Negative for visual disturbance.  Respiratory: Negative for cough and shortness of breath.   Cardiovascular:  Negative for chest pain and palpitations.  Gastrointestinal: Negative for abdominal pain, diarrhea, constipation and blood in stool.  Genitourinary: Negative for dysuria and hematuria.  Musculoskeletal: Negative for myalgias, back pain and arthralgias.  Skin: Negative for rash.  Neurological: Positive for headaches. Negative for dizziness, syncope and weakness.  Hematological: Negative for adenopathy.  Psychiatric/Behavioral: Negative for confusion and dysphoric mood.       Objective:   Physical Exam  Constitutional: She is oriented to person, place, and time. She appears well-developed and well-nourished. No distress.  HENT:  Right Ear: External ear normal.  Left Ear: External ear normal.  Mouth/Throat: Oropharynx is clear and moist.  Eyes: Pupils are equal, round, and reactive to light.  Neck: Neck supple. No thyromegaly present.  Cardiovascular: Normal rate, regular rhythm and normal heart sounds.   No murmur heard. Pulmonary/Chest: Effort normal and breath sounds normal. No respiratory distress. She has no wheezes. She has no rales.  Abdominal: Soft. There is no tenderness.  Genitourinary: Uterus normal. Vaginal discharge found.       Breasts without mass.  White vaginal discharge which is mucoid and does not appear to be infectious (eg candida).  Musculoskeletal: She exhibits no edema.  Lymphadenopathy:    She has no cervical adenopathy.  Neurological: She is alert and oriented to person, place, and time.  Psychiatric: She has a normal mood and affect. Her behavior is normal.          Assessment & Plan:  #1  Health Maintenance.  Pap smear sent . Labs obtained.  Discussed exercise. #2  Hypothyroid.  TSH.  Refilled medication. #3 Migraines.  Discussed possible triggers.  Trial of Maxalt or Relpax with samples of both given and instructions to see which works best.

## 2011-09-10 NOTE — Patient Instructions (Addendum)
Take Relpax or Maxalt as follows: 1 at earliest onset of migraine headache. May repeat one in 2 hours as needed and never more than 2 in 24 hours. Give me feedback if one is working better than the other so we can refill. We will call you regarding labs and pap results.

## 2011-09-13 ENCOUNTER — Other Ambulatory Visit: Payer: Self-pay | Admitting: Family Medicine

## 2011-09-13 ENCOUNTER — Telehealth: Payer: Self-pay | Admitting: Family Medicine

## 2011-09-13 MED ORDER — LEVOTHYROXINE SODIUM 150 MCG PO TABS: 150.0000 ug | ORAL_TABLET | Freq: Every day | ORAL | Status: DC

## 2011-09-13 NOTE — Progress Notes (Signed)
Quick Note:  Pt informed, and yes she is taking her thyroid regularly. Will call in 150 dose. Pt was informed she needs return OV in 3 months, will check labs at OV ______

## 2011-09-13 NOTE — Telephone Encounter (Signed)
Given to her moments ago.

## 2011-09-13 NOTE — Telephone Encounter (Signed)
Requesting her lab results. Thanks. °

## 2011-09-16 NOTE — Progress Notes (Signed)
Quick Note:  Pt informed on VM ______ 

## 2011-09-27 ENCOUNTER — Telehealth: Payer: Self-pay | Admitting: Family Medicine

## 2011-09-27 NOTE — Telephone Encounter (Signed)
I spoke with pt, she will be seen at the [facility] clinic tomorrow 12:30 pm.  Pt informed of appt time on work VM

## 2011-09-27 NOTE — Telephone Encounter (Signed)
Pt has uti and is req a med to be called in to [facility] [address], [place], [place] [phone]. Pt req call back from nurse.

## 2011-09-28 ENCOUNTER — Ambulatory Visit (INDEPENDENT_AMBULATORY_CARE_PROVIDER_SITE_OTHER): Payer: BC Managed Care – PPO | Admitting: Family Medicine

## 2011-09-28 ENCOUNTER — Encounter: Payer: Self-pay | Admitting: Family Medicine

## 2011-09-28 VITALS — BP 102/68 | Temp 99.0°F | Wt 173.0 lb

## 2011-09-28 DIAGNOSIS — K5289 Other specified noninfective gastroenteritis and colitis: Secondary | ICD-10-CM

## 2011-09-28 DIAGNOSIS — N390 Urinary tract infection, site not specified: Secondary | ICD-10-CM

## 2011-09-28 DIAGNOSIS — K529 Noninfective gastroenteritis and colitis, unspecified: Secondary | ICD-10-CM

## 2011-09-28 LAB — POCT URINALYSIS DIPSTICK
Glucose, UA: NEGATIVE
pH, UA: 6

## 2011-09-28 MED ORDER — PROMETHAZINE HCL 25 MG PO TABS: 25.0000 mg | ORAL_TABLET | Freq: Three times a day (TID) | ORAL | Status: DC | PRN

## 2011-09-28 MED ORDER — SULFAMETHOXAZOLE-TRIMETHOPRIM 800-160 MG PO TABS: 1.0000 | ORAL_TABLET | Freq: Two times a day (BID) | ORAL | Status: AC

## 2011-09-28 MED ORDER — PROMETHAZINE HCL 25 MG RE SUPP: 25.0000 mg | Freq: Four times a day (QID) | RECTAL | Status: DC | PRN

## 2011-09-28 MED ORDER — CEFTRIAXONE SODIUM 1 G IJ SOLR
1.0000 g | Freq: Once | INTRAMUSCULAR | Status: AC
  Administered 2011-09-28: 1 g via INTRAMUSCULAR

## 2011-09-28 MED ORDER — PROMETHAZINE HCL 50 MG/ML IJ SOLN
25.0000 mg | INTRAMUSCULAR | Status: AC
  Administered 2011-09-28: 25 mg via INTRAMUSCULAR

## 2011-09-28 NOTE — Patient Instructions (Signed)
Urinary Tract Infection Infections of the urinary tract can start in several places. A bladder infection (cystitis), a kidney infection (pyelonephritis), and a prostate infection (prostatitis) are different types of urinary tract infections (UTIs). They usually get better if treated with medicines (antibiotics) that kill germs. Take all the medicine until it is gone. You or your child may feel better in a few days, but TAKE ALL MEDICINE or the infection may not respond and may become more difficult to treat. HOME CARE INSTRUCTIONS    Drink enough water and fluids to keep the urine clear or pale yellow. Cranberry juice is especially recommended, in addition to large amounts of water.     Avoid caffeine, tea, and carbonated beverages. They tend to irritate the bladder.     Alcohol may irritate the prostate.     Only take over-the-counter or prescription medicines for pain, discomfort, or fever as directed by your caregiver.  To prevent further infections:  Empty the bladder often. Avoid holding urine for long periods of time.     After a bowel movement, women should cleanse from front to back. Use each tissue only once.     Empty the bladder before and after sexual intercourse.  FINDING OUT THE RESULTS OF YOUR TEST Not all test results are available during your visit. If your or your child's test results are not back during the visit, make an appointment with your caregiver to find out the results. Do not assume everything is normal if you have not heard from your caregiver or the medical facility. It is important for you to follow up on all test results. SEEK MEDICAL CARE IF:    There is back pain.     Your baby is older than 3 months with a rectal temperature of 100.5 F (38.1 C) or higher for more than 1 day.     Your or your child's problems (symptoms) are no better in 3 days. Return sooner if you or your child is getting worse.  SEEK IMMEDIATE MEDICAL CARE IF:    There is severe back  pain or lower abdominal pain.     You or your child develops chills.     You have a fever.     Your baby is older than 3 months with a rectal temperature of 102 F (38.9 C) or higher.     Your baby is 52 months old or younger with a rectal temperature of 100.4 F (38 C) or higher.     There is nausea or vomiting.     There is continued burning or discomfort with urination.  MAKE SURE YOU:    Understand these instructions.     Will watch your condition.     Will get help right away if you are not doing well or get worse.  Document Released: 06/26/2005 Document Revised: 05/29/2011 Document Reviewed: 01/29/2007 [facility] Patient Information 2012 [place], [place].Viral Gastroenteritis Viral gastroenteritis is also known as stomach flu. This condition affects the stomach and intestinal tract. The illness typically lasts 3 to 8 days. Most people develop an immune response. This eventually gets rid of the virus. While this natural response develops, the virus can make you quite ill.   CAUSES   Diarrhea and vomiting are often caused by a virus. Medicines (antibiotics) that kill germs will not help unless there is also a germ (bacterial) infection. SYMPTOMS   The most common symptom is diarrhea. This can cause severe loss of fluids (dehydration) and body salt (electrolyte) imbalance.  TREATMENT   Treatments for this illness are aimed at rehydration. Antidiarrheal medicines are not recommended. They do not decrease diarrhea volume and may be harmful. Usually, home treatment is all that is needed. The most serious cases involve vomiting so severely that you are not able to keep down fluids taken by mouth (orally). In these cases, intravenous (IV) fluids are needed. Vomiting with viral gastroenteritis is common, but it will usually go away with treatment. HOME CARE INSTRUCTIONS   Small amounts of fluids should be taken frequently. Large amounts at one time may not be tolerated. Plain water may be  harmful in infants and the elderly. Oral rehydration solutions (ORS) are available at pharmacies and grocery stores. ORS replace water and important electrolytes in proper proportions. Sports drinks are not as effective as ORS and may be harmful due to sugars worsening diarrhea.  As a general guideline for children, replace any new fluid losses from diarrhea or vomiting with ORS as follows:     If your child weighs 22 pounds or under (10 kg or less), give 60-120 mL (1/4 - 1/2 cup or 2 - 4 ounces) of ORS for each diarrheal stool or vomiting episode.     If your child weighs more than 22 pounds (more than 10 kgs), give 120-240 mL (1/2 - 1 cup or 4 - 8 ounces) of ORS for each diarrheal stool or vomiting episode.     In a child with vomiting, it may be helpful to give the above ORS replacement in 5 mL (1 teaspoon) amounts every 5 minutes, then increase as tolerated.     While correcting for dehydration, children should eat normally. However, foods high in sugar should be avoided because this may worsen diarrhea. Large amounts of carbonated soft drinks, juice, gelatin desserts, and other highly sugared drinks should be avoided.     After correction of dehydration, other liquids that are appealing to the child may be added. Children should drink small amounts of fluids frequently and fluids should be increased as tolerated.     Adults should eat normally while drinking more fluids than usual. Drink small amounts of fluids frequently and increase as tolerated. Drink enough water and fluids to keep your urine clear or pale yellow. Broths, weak decaffeinated tea, lemon-lime soft drinks (allowed to go flat), and ORS replace fluids and electrolytes.     Avoid:    Carbonated drinks.     Juice.    Extremely hot or cold fluids.     Caffeine drinks.     Fatty, greasy foods.     Alcohol.    Tobacco.    Too much intake of anything at one time.     Gelatin desserts.     Probiotics are active  cultures of beneficial bacteria. They may lessen the amount and number of diarrheal stools in adults. Probiotics can be found in yogurt with active cultures and in supplements.     Wash your hands well to avoid spreading bacteria and viruses.     Antidiarrheal medicines are not recommended for infants and children.     Only take over-the-counter or prescription medicines for pain, discomfort, or fever as directed by your caregiver. Do not give aspirin to children.     For adults with dehydration, ask your caregiver if you should continue all prescribed and over-the-counter medicines.     If your caregiver has given you a follow-up appointment, it is very important to keep that appointment. Not keeping the appointment could  result in a lasting (chronic) or permanent injury and disability. If there is any problem keeping the appointment, you must call to reschedule.  SEEK IMMEDIATE MEDICAL CARE IF:    You are unable to keep fluids down.     There is no urine output in 6 to 8 hours or there is only a small amount of very dark urine.     You develop shortness of breath.     There is blood in the vomit (may look like coffee grounds) or stool.     Belly (abdominal) pain develops, increases, or localizes.     There is persistent vomiting or diarrhea.     You have a fever.     Your baby is older than 3 months with a rectal temperature of 102 F (38.9 C) or higher.     Your baby is 65 months old or younger with a rectal temperature of 100.4 F (38 C) or higher.  MAKE SURE YOU:    Understand these instructions.     Will watch your condition.     Will get help right away if you are not doing well or get worse.  Document Released: 09/16/2005 Document Revised: 05/29/2011 Document Reviewed: 01/28/2007 [facility] Patient Information 2012 [place], [place].

## 2011-09-28 NOTE — Progress Notes (Signed)
Addended by: Azucena Freed on: 09/28/2011 01:58 PM   Modules accepted: Orders

## 2011-09-28 NOTE — Progress Notes (Signed)
Subjective:    Patient ID: Debbie [name], female    DOB: [birth year], 30 y.o.   MRN: [id number]  HPI Vomiting, diarrhea, chills, uti- painful urination pt has sensation and urgency since Wednesday.  Says her kids had gastroenteritis but theres only last 24 hours. She has now had sxs for almost 4 days.  Last night noticed some dysuria.  Has vomited today already.  No fever.  No prior hx of UTI.  Says was tx fro sinusitis recently with augmentin for 10 days. Completed tx  About 1.5 weeks ago.      Review of Systems BP 102/68  Temp(Src) 99 F (37.2 C) (Oral)  Wt 173 lb (78.472 kg)  LMP 08/22/2011    Allergies  Allergen Reactions  . Aspirin Hives    Past Medical History  Diagnosis Date  . Anemia   . Depression   . Migraines   . UTI (urinary tract infection)   . Migraine   . Thyroid disease     Past Surgical History  Procedure Date  . Appendectomy   . Tonsillectomy and adenoidectomy   . Tonsillectomy 2001  . Tonsillectomy     History   Social History  . Marital Status: Single    Spouse Name: N/A    Number of Children: N/A  . Years of Education: N/A   Occupational History  . Not on file.   Social History Main Topics  . Smoking status: Never Smoker   . Smokeless tobacco: Not on file  . Alcohol Use: No  . Drug Use: No  . Sexually Active: Yes    Birth Control/ Protection: IUD   Other Topics Concern  . Not on file   Social History Narrative  . No narrative on file    Family History  Problem Relation Age of Onset  . Arthritis Neg Hx     family  . Hypertension Mother   . Hyperlipidemia Father   . Diabetes Father   . Hypertension Father         Objective:   Physical Exam  Constitutional: She is oriented to person, place, and time. She appears well-developed and well-nourished.  HENT:  Head: Normocephalic and atraumatic.  Right Ear: External ear normal.  Left Ear: External ear normal.  Nose: Nose normal.  Mouth/Throat: Oropharynx is clear and moist.        TMs and canals are clear.   Eyes: Conjunctivae and EOM are normal. Pupils are equal, round, and reactive to light.  Neck: Neck supple. No thyromegaly present.  Cardiovascular: Normal rate, regular rhythm and normal heart sounds.   Pulmonary/Chest: Effort normal and breath sounds normal. She has no wheezes.  Abdominal: Soft. Bowel sounds are normal. She exhibits no distension and no mass. There is tenderness. There is no rebound and no guarding.       + suprabubic tenderness.   Musculoskeletal:       Mild right CVA tenderness.   Lymphadenopathy:    She has no cervical adenopathy.  Neurological: She is alert and oriented to person, place, and time.  Skin: Skin is warm and dry.  Psychiatric: She has a normal mood and affect.          Assessment & Plan:  Gastroenteritis  With secondary UTI. Maybe early pyelonephritis with persistant vomiting. No fever.  Given IM rocephin and IM phenergan today.  If unable to keep abx down will need to go to ED for IV fluids and ABX. She doesn not appear dehydrated on exam  today. Membranes are still moist. If unable to keep the antibiotic down in the next 12-24 hours then needs to go to the ED . Pt says she understands instruction. She is to go sooner if gets worse with fever or pain.

## 2011-10-02 ENCOUNTER — Telehealth: Payer: Self-pay | Admitting: Family Medicine

## 2011-10-02 NOTE — Telephone Encounter (Signed)
Pt aware.

## 2011-10-02 NOTE — Telephone Encounter (Signed)
Please call patient with her and her urine culture was positive for Escherichia coli which is a very common urinary tract infection. The Bactrim that I placed on should completely take care of the infection. Just make sure to complete all of the antibiotic. If she's having any problems please let me know.

## 2011-10-14 ENCOUNTER — Telehealth: Payer: Self-pay | Admitting: Family Medicine

## 2011-10-14 MED ORDER — RIZATRIPTAN BENZOATE 10 MG PO TBDP: 10.0000 mg | ORAL_TABLET | ORAL | Status: DC | PRN

## 2011-10-14 NOTE — Telephone Encounter (Signed)
Migraines are out of control. Would like a Rx for Maxalt now, instead of samples. Please send Rx for [facility] to [facility] on [address] [place], [place]. PH# [phone].

## 2011-10-14 NOTE — Telephone Encounter (Signed)
rx sent to pharmacy

## 2011-10-14 NOTE — Telephone Encounter (Signed)
May refill Maxalt MLT 10 mg #6 use as directed with 2 refills.

## 2011-10-24 ENCOUNTER — Encounter: Payer: Self-pay | Admitting: Family Medicine

## 2011-10-24 ENCOUNTER — Ambulatory Visit (INDEPENDENT_AMBULATORY_CARE_PROVIDER_SITE_OTHER): Payer: BC Managed Care – PPO | Admitting: Family Medicine

## 2011-10-24 VITALS — BP 120/80 | Temp 98.5°F | Wt 174.0 lb

## 2011-10-24 DIAGNOSIS — L42 Pityriasis rosea: Secondary | ICD-10-CM

## 2011-10-24 DIAGNOSIS — J310 Chronic rhinitis: Secondary | ICD-10-CM

## 2011-10-24 DIAGNOSIS — J309 Allergic rhinitis, unspecified: Secondary | ICD-10-CM

## 2011-10-24 DIAGNOSIS — R1013 Epigastric pain: Secondary | ICD-10-CM

## 2011-10-24 DIAGNOSIS — E039 Hypothyroidism, unspecified: Secondary | ICD-10-CM

## 2011-10-24 DIAGNOSIS — D649 Anemia, unspecified: Secondary | ICD-10-CM

## 2011-10-24 MED ORDER — FLUTICASONE PROPIONATE 50 MCG/ACT NA SUSP: 2.0000 | Freq: Every day | NASAL | Status: DC

## 2011-10-24 NOTE — Progress Notes (Signed)
  Subjective:    Patient ID: Debbie [name], female    DOB: 04-25-1981, 31 y.o.   MRN: [id number]  HPI  Patient seen for several issues as follows  Trunk rash which started a few weeks ago. Minimally pruritic. Initially started with one oval shaped patch right lateral chest wall and subsequent spread. Minimally scaly. Has used hydrocortisone cream with minimal relief. No fever.  Recurrent rhinitis symptoms. Rhinitis also noted for several weeks if not months. Frequent sneezing. Occasional chills. No definite fever. Has taken Zyrtec without much. Frequent nasal congestion symptoms. Intermittent headaches.  Intermittent epigastric discomfort past several days. Frequent sour taste in mouth. No regular nonsteroidal use. No melena. No hematemesis. No alleviating factors. No history of peptic ulcer disease.  History of recent fatigue. She has low-grade anemia and taking iron and consistently. Under replaced thyroid with recent TSH of 15. We modified her dosage and will be due for repeat labs soon.   Review of Systems  Constitutional: Positive for chills and fatigue. Negative for fever, appetite change and unexpected weight change.  HENT: Positive for congestion. Negative for ear pain and sore throat.   Respiratory: Negative for cough, shortness of breath and wheezing.   Cardiovascular: Negative for chest pain.  Skin: Positive for rash.  Neurological: Negative for dizziness and syncope.  Hematological: Negative for adenopathy. Does not bruise/bleed easily.       Objective:   Physical Exam  Constitutional: She is oriented to person, place, and time. She appears well-developed and well-nourished.  HENT:  Right Ear: External ear normal.  Left Ear: External ear normal.  Nose: Nose normal.  Mouth/Throat: Oropharynx is clear and moist.  Neck: Neck supple. No thyromegaly present.  Cardiovascular: Normal rate and regular rhythm.   No murmur heard. Pulmonary/Chest: Effort normal and breath sounds  normal. No respiratory distress. She has no wheezes. She has no rales.  Abdominal: Soft. Bowel sounds are normal. She exhibits no distension and no mass. There is no rebound and no guarding.       Minimally tender epigastric region. No mass. No hepatomegaly or splenomegaly.  Musculoskeletal: She exhibits no edema.  Lymphadenopathy:    She has no cervical adenopathy.  Neurological: She is alert and oriented to person, place, and time.  Skin: Rash noted.       Patient has a slightly hyperpigmented rash in dermatomal distribution bilaterally confined to trunk region          Assessment & Plan:  #1 skin rash. Suspect pityriasis rosea. She is aware this may last for several months. Continue hydrocortisone cream as needed for symptom relief #2 recurrent rhinitis differential is viral versus allergic. Try regular Allegra or Zyrtec and add Flonase. #3 hypothyroidism. Labs ordered for her future TSH #4 anemia. Labs ordered for future CBC. Continue iron replacement. #5 recent intermittent epigastric pain. Suspect GERD. Bland diet. Avoid nonsteroidals. Short-term use Nexium 40 mg daily with samples provided. Touch base 2-3 weeks if symptoms not resolving

## 2011-10-24 NOTE — Patient Instructions (Signed)
Pityriasis Rosea Pityriasis rosea is a rash which is probably caused by a virus. It generally starts as a scaly, red patch on the trunk (the area of the body that a t-shirt would cover) but does not appear on sun exposed areas. The rash is usually preceded by an initial larger spot called the "herald patch" a week or more before the rest of the rash appears. Generally within one to two days the rash appears rapidly on the trunk, upper arms, and sometimes the upper legs. The rash usually appears as flat, oval patches of scaly pink color. The rash can also be raised and one is able to feel it with a finger. The rash can also be finely crinkled and may slough off leaving a ring of scale around the spot. Sometimes a mild sore throat is present with the rash. It usually affects children and young adults in the spring and autumn. Women are more frequently affected than men. TREATMENT  Pityriasis rosea is a self-limited condition. This means it goes away within 4 to 8 weeks without treatment. The spots may persist for several months, especially in darker-colored skin after the rash has resolved and healed. Benadryl and steroid creams may be used if itching is a problem. SEEK MEDICAL CARE IF:   Your rash does not go away or persists longer than three months.   You develop fever and joint pain.   You develop severe headache and confusion.   You develop breathing difficulty, vomiting and/or extreme weakness.  Document Released: 10/23/2001 Document Revised: 05/29/2011 Document Reviewed: 11/11/2008 [facility] Patient Information 2012 [place], [place].  Nexium 40 mg one daily for acid suppression. Take Zyrtec or Allegra one daily Start Flonase 2 sprays per nostril once daily

## 2011-11-04 ENCOUNTER — Telehealth: Payer: Self-pay | Admitting: Family Medicine

## 2011-11-04 MED ORDER — LEVOTHYROXINE SODIUM 150 MCG PO TABS: 150.0000 ug | ORAL_TABLET | Freq: Every day | ORAL | Status: DC

## 2011-11-04 NOTE — Telephone Encounter (Signed)
Pt req refill of levothyroxine (SYNTHROID) 150 MCG tablet to be [facility] [place], [place] [phone].  Pt said that shes been out of her meds for wks and is req that this be called in today.

## 2011-12-18 ENCOUNTER — Ambulatory Visit: Payer: BC Managed Care – PPO | Admitting: Family Medicine

## 2011-12-20 ENCOUNTER — Encounter (HOSPITAL_BASED_OUTPATIENT_CLINIC_OR_DEPARTMENT_OTHER): Payer: Self-pay

## 2011-12-20 ENCOUNTER — Emergency Department (HOSPITAL_BASED_OUTPATIENT_CLINIC_OR_DEPARTMENT_OTHER)
Admission: EM | Admit: 2011-12-20 | Discharge: 2011-12-20 | Disposition: A | Discharge status: Home / Self Care | Payer: BC Managed Care – PPO | Attending: Emergency Medicine | Admitting: Emergency Medicine

## 2011-12-20 DIAGNOSIS — R509 Fever, unspecified: Secondary | ICD-10-CM | POA: Insufficient documentation

## 2011-12-20 DIAGNOSIS — F329 Major depressive disorder, single episode, unspecified: Secondary | ICD-10-CM | POA: Insufficient documentation

## 2011-12-20 DIAGNOSIS — R220 Localized swelling, mass and lump, head: Secondary | ICD-10-CM | POA: Insufficient documentation

## 2011-12-20 DIAGNOSIS — R51 Headache: Secondary | ICD-10-CM

## 2011-12-20 DIAGNOSIS — R05 Cough: Secondary | ICD-10-CM | POA: Insufficient documentation

## 2011-12-20 DIAGNOSIS — E079 Disorder of thyroid, unspecified: Secondary | ICD-10-CM | POA: Insufficient documentation

## 2011-12-20 DIAGNOSIS — Z79899 Other long term (current) drug therapy: Secondary | ICD-10-CM | POA: Insufficient documentation

## 2011-12-20 DIAGNOSIS — R059 Cough, unspecified: Secondary | ICD-10-CM | POA: Insufficient documentation

## 2011-12-20 DIAGNOSIS — F3289 Other specified depressive episodes: Secondary | ICD-10-CM | POA: Insufficient documentation

## 2011-12-20 DIAGNOSIS — J329 Chronic sinusitis, unspecified: Secondary | ICD-10-CM | POA: Insufficient documentation

## 2011-12-20 MED ORDER — AMOXICILLIN 500 MG PO CAPS: 500.0000 mg | ORAL_CAPSULE | Freq: Three times a day (TID) | ORAL | Status: AC

## 2011-12-20 MED ORDER — KETOROLAC TROMETHAMINE 60 MG/2ML IM SOLN
60.0000 mg | Freq: Once | INTRAMUSCULAR | Status: AC
  Administered 2011-12-20: 60 mg via INTRAMUSCULAR

## 2011-12-20 MED ORDER — DIPHENHYDRAMINE HCL 50 MG/ML IJ SOLN
25.0000 mg | Freq: Once | INTRAMUSCULAR | Status: DC
  Filled 2011-12-20: qty 1

## 2011-12-20 MED ORDER — AMOXICILLIN 500 MG PO CAPS
500.0000 mg | ORAL_CAPSULE | Freq: Once | ORAL | Status: AC
  Administered 2011-12-20: 500 mg via ORAL

## 2011-12-20 MED ORDER — KETOROLAC TROMETHAMINE 60 MG/2ML IM SOLN
INTRAMUSCULAR | Status: AC
  Filled 2011-12-20: qty 2

## 2011-12-20 MED ORDER — AMOXICILLIN 500 MG PO CAPS
ORAL_CAPSULE | ORAL | Status: AC
  Administered 2011-12-20: 500 mg via ORAL
  Filled 2011-12-20: qty 1

## 2011-12-20 NOTE — ED Provider Notes (Signed)
History     CSN: [id number]  Arrival date & time 12/20/11  2011   First MD Initiated Contact with Patient 12/20/11 2034      Chief Complaint  Patient presents with  . URI    (Consider location/radiation/quality/duration/timing/severity/associated sxs/prior treatment) HPI Comments: Pt states that she has been using otc medications without relief:pts states that her headache is bad tonight and feels like seh may have a migraine on top of the sinus pressure  Patient is a 31 y.o. female presenting with URI. The history is provided by the patient. No language interpreter was used.  URI The primary symptoms include fever, headaches and cough. Primary symptoms do not include sore throat, nausea, vomiting or rash. The current episode started more than 1 week ago. This is a new problem. The problem has not changed since onset. Symptoms associated with the illness include facial pain, sinus pressure and congestion.    Past Medical History  Diagnosis Date  . Anemia   . Depression   . Migraines   . UTI (urinary tract infection)   . Migraine   . Thyroid disease     Past Surgical History  Procedure Date  . Appendectomy   . Tonsillectomy and adenoidectomy   . Tonsillectomy 2001  . Tonsillectomy     Family History  Problem Relation Age of Onset  . Arthritis Neg Hx     family  . Hypertension Mother   . Hyperlipidemia Father   . Diabetes Father   . Hypertension Father     History  Substance Use Topics  . Smoking status: Never Smoker   . Smokeless tobacco: Not on file  . Alcohol Use: No    OB History    Grav Para Term Preterm Abortions TAB SAB Ect Mult Living                  Review of Systems  Constitutional: Positive for fever.  HENT: Positive for congestion and sinus pressure. Negative for sore throat.   Eyes: Negative.   Respiratory: Positive for cough.   Cardiovascular: Negative.   Gastrointestinal: Negative for nausea and vomiting.  Musculoskeletal: Negative.    Skin: Negative for rash.  Neurological: Positive for headaches.    Allergies  Aspirin; Bactrim; and Sulfa antibiotics  Home Medications   Current Outpatient Rx  Name Route Sig Dispense Refill  . FLUTICASONE PROPIONATE 50 MCG/ACT NA SUSP Nasal Place 2 sprays into the nose daily. 16 g 6  . IBUPROFEN 800 MG PO TABS Oral Take 800 mg by mouth every 8 (eight) hours as needed. For pain     . LEVONORGESTREL 20 MCG/24HR IU IUD Intrauterine 1 each by Intrauterine route once.      Marland Kitchen LEVOTHYROXINE SODIUM 150 MCG PO TABS Oral Take 1 tablet (150 mcg total) by mouth daily. 90 tablet 3  . PSEUDOEPHEDRINE HCL 30 MG/5ML PO LIQD Oral Take 30 mg by mouth every 6 (six) hours. Patient used this medication for sinuses.    Marland Kitchen RIZATRIPTAN BENZOATE 10 MG PO TBDP Oral Take 1 tablet (10 mg total) by mouth as needed for migraine. May repeat in 2 hours if needed 6 tablet 2    BP 105/78  Pulse 64  Temp(Src) 97.9 F (36.6 C) (Oral)  Resp 16  Ht 5\' 2"  (1.575 m)  Wt 172 lb (78.019 kg)  BMI 31.46 kg/m2  SpO2 100%  LMP 12/15/2011  Physical Exam  Nursing note and vitals reviewed. Constitutional: She is oriented to person, place, and  time. She appears well-developed and well-nourished.  HENT:  Head: Normocephalic and atraumatic.  Right Ear: External ear normal.  Left Ear: External ear normal.  Nose: Mucosal edema present.  Eyes: Conjunctivae and EOM are normal. Pupils are equal, round, and reactive to light.  Neck: Neck supple.  Pulmonary/Chest: Effort normal and breath sounds normal.  Neurological: She is alert and oriented to person, place, and time.  Skin: Skin is warm and dry.  Psychiatric: She has a normal mood and affect.    ED Course  Procedures (including critical care time)  Labs Reviewed - No data to display No results found.   1. Headache   2. Sinusitis       MDM  Pt feeling slightly better at this time after toradol and benadryl:will treat for sinusitis at  home        Teressa Lower, NP 12/20/11 2138

## 2011-12-20 NOTE — ED Notes (Signed)
C/o sinus congestion, prod cough x 1.5 weeks

## 2011-12-20 NOTE — ED Provider Notes (Signed)
Medical screening examination/treatment/procedure(s) were performed by non-physician practitioner and as supervising physician I was immediately available for consultation/collaboration.   Kyan Yurkovich, MD 12/20/11 2343 

## 2011-12-20 NOTE — Discharge Instructions (Signed)
Cefaleas en general, sin causa  (Headache, General, Unknown Cause)  Puede ser que en el da de hoy no se haya determinado la causa especfica de su dolor de cabeza. Hay muchas causas y tipos de cefalea. Las ms frecuentes son:   Cefalea tensional   Migraa   Infecciones (por ejemplo: infecciones dentarias y en los senos)   Problemas en el hueso o en la articulacin del cuello o la mandbula.   Depresin   Problemas visuales  Estos tipos de cefalea no ponen en peligro su vida.   Este trastorno puede diagnosticarse a partir de la historia clnica y un examen fsico. En algunos casos se realizan estudios de laboratorio y por imgenes (como radiografas o tomografas computadas) para diagnosticar problemas ms graves. En algunos casos se indicar una puncin lumbar. En muchos casos los anlisis y las pruebas pueden ser normales en la primera visita y la causa de sus dolores de cabeza es un problema grave. Por eso es muy importante que concurra a los controles con su mdico o la clnica de su localidad para realizar nuevas evaluaciones.  AVERIGUE LOS RESULTADOS DE LAS PRUEBAS   Si le han tomado radiografas, un radilogo leer los resultados.   Ser contactado por el servicio de urgencias o por su mdico si los resultados de alguna prueba indican que debe realizar algn cambio en el plan de tratamiento.   Durante su visita puede ser que no cuente con todos los resultados de los anlisis. En este caso, tenga otra entrevista con su mdico para conocerlos. No piense que el resultado es normal si no tiene noticias de su mdico o de la institucin mdica. Es importante el seguimiento de todos los resultados de los anlisis.  INSTRUCCIONES PARA EL CUIDADO DOMICILIARIO   Concurra a los controles con su mdico o especialista.   Slo tome medicamentos de venta libre o prescriptos para calmar el dolor, las molestias, o bajar la fiebre segn las indicaciones de su mdico.   Las tcnicas de bioretroalimentacin, los  masajes y otras tcnicas de relajacin pueden ser de utilidad.   Pueden utilizarse bolsas de hielo o calor aplicadas a la cabeza o al cuello. Repita tres o cuatro veces por da, o cuando lo necesite.   Comunquese con su mdico si tiene preguntas o preocupaciones.   Si fuma, debe abandonar el hbito.  SOLICITE ATENCIN MDICA SI:   Presenta algn problema con el medicamento que le han prescrito.   No responde o no obtiene alivio de los medicamentos.   El dolor de cabeza que senta habitualmente es diferente.   Presenta nuseas o vmitos.  SOLICITE ATENCIN MDICA DE INMEDIATO SI:    El dolor de cabeza es cada vez ms intenso.   La temperatura oral se eleva sin motivo por encima de 102 F (38.9 C) o segn le indique el profesional que lo asiste.   Presenta rigidez en el cuello.   Sufre prdida de la visin.   Siente debilidad muscular.   Sufre prdida del control muscular.   Desarrolla sntomas graves diferentes de los primeros.   Comienza a perder el equilibrio o tiene problemas para caminar.   Se desmaya o pierde el conocimiento.  EST SEGURO QUE:    Comprende las instrucciones para el alta mdica.   Controlar su enfermedad.   Solicitar atencin mdica de inmediato segn las indicaciones.  Document Released: 06/26/2005 Document Revised: 09/05/2011  [employer] Patient Information 2012 [employer].

## 2012-01-24 ENCOUNTER — Telehealth: Payer: Self-pay | Admitting: *Deleted

## 2012-01-24 NOTE — Telephone Encounter (Signed)
LM for pt to call us back. Pt called back and was sent to scheduling to make and appt with Dr. Caryl Never.

## 2012-01-24 NOTE — Telephone Encounter (Signed)
If having this frequent we need to discuss prophylaxis.  Needs to come in so we can evaluate possible prophylactic meds.

## 2012-01-24 NOTE — Telephone Encounter (Signed)
LMTCB

## 2012-01-24 NOTE — Telephone Encounter (Signed)
Pt is having migraines 2x weekly, and asking for a new RX stronger than Ibuprofen or Maxalt.

## 2012-01-27 ENCOUNTER — Ambulatory Visit: Payer: BC Managed Care – PPO

## 2012-01-27 ENCOUNTER — Ambulatory Visit (INDEPENDENT_AMBULATORY_CARE_PROVIDER_SITE_OTHER): Payer: BC Managed Care – PPO | Admitting: Family Medicine

## 2012-01-27 ENCOUNTER — Encounter: Payer: Self-pay | Admitting: Family Medicine

## 2012-01-27 DIAGNOSIS — G43909 Migraine, unspecified, not intractable, without status migrainosus: Secondary | ICD-10-CM

## 2012-01-27 DIAGNOSIS — D649 Anemia, unspecified: Secondary | ICD-10-CM

## 2012-01-27 DIAGNOSIS — E039 Hypothyroidism, unspecified: Secondary | ICD-10-CM

## 2012-01-27 LAB — CBC WITH DIFFERENTIAL/PLATELET
HCT: 36.4 % (ref 36.0–46.0)
Hemoglobin: 11.4 g/dL — ABNORMAL LOW (ref 12.0–15.0)
MCHC: 31.2 g/dL (ref 30.0–36.0)
RDW: 18.7 % — ABNORMAL HIGH (ref 11.5–14.6)

## 2012-01-27 MED ORDER — TOPIRAMATE 25 MG PO TABS: ORAL_TABLET | ORAL | Status: DC

## 2012-01-27 MED ORDER — ELETRIPTAN HYDROBROMIDE 40 MG PO TABS: 40.0000 mg | ORAL_TABLET | ORAL | Status: DC | PRN

## 2012-01-27 NOTE — Progress Notes (Signed)
  Subjective:    Patient ID: Debbie [name], female    DOB: 1980/11/16, 31 y.o.   MRN: [id number]  HPI  Followup for several items  Migraine headaches. Generally having about 2 per week. Migraines are usually right retro-orbital. Sharp throbbing quality and severe at times. She has associated photophobia and nausea. Worse with activity. Has tried Maxalt without much relief. Has used ibuprofen without Aleve. No clear triggers. She is never identified any food triggers. Possible perfume triggers. She has eliminated use of perfumes. Not related to menses. She uses IUD.  Hypothyroidism. Recent titration of dosage several months ago needs repeat level. Also history of anemia. Family history of thalassemia minor. Patient not tested. Taking iron supplements since last visit. Denies any dizziness or weakness.  Past Medical History  Diagnosis Date  . Anemia   . Depression   . Migraines   . UTI (urinary tract infection)   . Migraine   . Thyroid disease    Past Surgical History  Procedure Date  . Appendectomy   . Tonsillectomy and adenoidectomy   . Tonsillectomy 2001  . Tonsillectomy     reports that she has never smoked. She does not have any smokeless tobacco history on file. She reports that she does not drink alcohol or use illicit drugs. family history includes Diabetes in her father; Hyperlipidemia in her father; and Hypertension in her father and mother.  There is no history of Arthritis. Allergies  Allergen Reactions  . Aspirin Hives  . Bactrim Swelling  . Sulfa Antibiotics Swelling      Review of Systems  Constitutional: Positive for fatigue. Negative for appetite change and unexpected weight change.  Eyes: Negative for visual disturbance.  Respiratory: Negative for cough and shortness of breath.   Cardiovascular: Negative for chest pain.  Gastrointestinal: Negative for abdominal pain.  Genitourinary: Negative for dysuria.  Neurological: Positive for headaches. Negative for  dizziness, seizures and syncope.  Psychiatric/Behavioral: Negative for dysphoric mood.       Objective:   Physical Exam  Constitutional: She is oriented to person, place, and time. She appears well-developed and well-nourished. No distress.  HENT:  Mouth/Throat: Oropharynx is clear and moist.  Neck: Neck supple. No thyromegaly present.  Cardiovascular: Normal rate and regular rhythm.   Pulmonary/Chest: Effort normal and breath sounds normal. No respiratory distress. She has no wheezes. She has no rales.  Musculoskeletal: She exhibits no edema.  Lymphadenopathy:    She has no cervical adenopathy.  Neurological: She is alert and oriented to person, place, and time. No cranial nerve deficit.          Assessment & Plan:  #1 migraine headaches. Continue to look for triggers. Consider Topamax 25 mg nightly for one week then titrate 50 mg nightly for prophylaxis. We discussed the fact this is not a medication to use with pregnancy but she has a 10 year IUD in place and has no plans to discontinue. She is aware that if she discontinues IUD would be to make other alternative plans. Reassess one month. Also try Relpax 40 mg for acute Aleve for migraine #2 hypothyroidism recheck TSH  #3 history of anemia. Recheck CBC.  ?thalassemia minor with Positive FH and chronic microcytic anemia.

## 2012-01-27 NOTE — Patient Instructions (Signed)

## 2012-01-29 ENCOUNTER — Other Ambulatory Visit: Payer: Self-pay | Admitting: *Deleted

## 2012-01-29 MED ORDER — LEVOTHYROXINE SODIUM 175 MCG PO TABS: 175.0000 ug | ORAL_TABLET | Freq: Every day | ORAL | Status: DC

## 2012-02-27 ENCOUNTER — Encounter: Payer: Self-pay | Admitting: Family Medicine

## 2012-02-27 ENCOUNTER — Ambulatory Visit (INDEPENDENT_AMBULATORY_CARE_PROVIDER_SITE_OTHER): Payer: BC Managed Care – PPO | Admitting: Family Medicine

## 2012-02-27 VITALS — BP 100/80 | Temp 97.9°F | Wt 169.0 lb

## 2012-02-27 DIAGNOSIS — Z0000 Encounter for general adult medical examination without abnormal findings: Secondary | ICD-10-CM

## 2012-02-27 DIAGNOSIS — G43909 Migraine, unspecified, not intractable, without status migrainosus: Secondary | ICD-10-CM

## 2012-02-27 DIAGNOSIS — Z7189 Other specified counseling: Secondary | ICD-10-CM

## 2012-02-27 DIAGNOSIS — Z7184 Encounter for health counseling related to travel: Secondary | ICD-10-CM

## 2012-02-27 DIAGNOSIS — Z23 Encounter for immunization: Secondary | ICD-10-CM

## 2012-02-27 MED ORDER — ATOVAQUONE-PROGUANIL HCL 250-100 MG PO TABS: 1.0000 | ORAL_TABLET | Freq: Every day | ORAL | Status: DC

## 2012-02-27 NOTE — Progress Notes (Signed)
  Subjective:    Patient ID: Debbie [name], female    DOB: 1980-12-03, 31 y.o.   MRN: [id number]  HPI  Patient seen for followup headaches and for travel medicine consultation. Migraines greatly improved almost totally abolished after starting Topamax. No side effects. She's had 5 pounds weight loss which she is excited about.  Upcoming travel to [place].  Patient did not have any known history of hepatitis A vaccine nor history of typhoid vaccine. She'll need malaria prevention.  Review of Systems  Constitutional: Negative for fever, activity change, appetite change and fatigue.  HENT: Negative for hearing loss, ear pain, sore throat and trouble swallowing.   Eyes: Negative for visual disturbance.  Respiratory: Negative for cough and shortness of breath.   Cardiovascular: Negative for chest pain and palpitations.  Gastrointestinal: Negative for abdominal pain, diarrhea, constipation and blood in stool.  Genitourinary: Negative for dysuria and hematuria.  Musculoskeletal: Negative for myalgias, back pain and arthralgias.  Skin: Negative for rash.  Neurological: Negative for dizziness, syncope and headaches.  Hematological: Negative for adenopathy.  Psychiatric/Behavioral: Negative for confusion and dysphoric mood.       Objective:   Physical Exam  Constitutional: She appears well-developed and well-nourished.  Neck: Neck supple. No thyromegaly present.  Cardiovascular: Normal rate and regular rhythm.   Pulmonary/Chest: Effort normal and breath sounds normal. No respiratory distress. She has no wheezes. She has no rales.  Musculoskeletal: She exhibits no edema.          Assessment & Plan:  #1 migraine headaches. Improved. Continue Topamax #2 travel medicine. Hepatitis A Tetanus up-to-date. Typhoid and malaria prevention given.

## 2012-04-26 ENCOUNTER — Emergency Department (HOSPITAL_BASED_OUTPATIENT_CLINIC_OR_DEPARTMENT_OTHER)
Admission: EM | Admit: 2012-04-26 | Discharge: 2012-04-26 | Disposition: A | Discharge status: Home / Self Care | Payer: BC Managed Care – PPO | Attending: Emergency Medicine | Admitting: Emergency Medicine

## 2012-04-26 ENCOUNTER — Encounter (HOSPITAL_BASED_OUTPATIENT_CLINIC_OR_DEPARTMENT_OTHER): Payer: Self-pay | Admitting: *Deleted

## 2012-04-26 DIAGNOSIS — D649 Anemia, unspecified: Secondary | ICD-10-CM | POA: Insufficient documentation

## 2012-04-26 DIAGNOSIS — E079 Disorder of thyroid, unspecified: Secondary | ICD-10-CM | POA: Insufficient documentation

## 2012-04-26 DIAGNOSIS — J029 Acute pharyngitis, unspecified: Secondary | ICD-10-CM | POA: Insufficient documentation

## 2012-04-26 MED ORDER — IBUPROFEN 800 MG PO TABS: 800.0000 mg | ORAL_TABLET | Freq: Three times a day (TID) | ORAL | Status: AC

## 2012-04-26 MED ORDER — IBUPROFEN 800 MG PO TABS
800.0000 mg | ORAL_TABLET | Freq: Once | ORAL | Status: AC
  Administered 2012-04-26: 800 mg via ORAL
  Filled 2012-04-26: qty 1

## 2012-04-26 MED ORDER — DIPHENHYDRAMINE HCL 25 MG PO CAPS
25.0000 mg | ORAL_CAPSULE | Freq: Once | ORAL | Status: AC
  Administered 2012-04-26: 25 mg via ORAL
  Filled 2012-04-26: qty 1

## 2012-04-26 NOTE — ED Provider Notes (Signed)
History     CSN: [id number]  Arrival date & time 04/26/12  0865   First MD Initiated Contact with Patient 04/26/12 1847      Chief Complaint  Patient presents with  . Sore Throat    (Consider location/radiation/quality/duration/timing/severity/associated sxs/prior treatment) HPI  Patient presents to ER complaining of a five-day history of gradual onset sore throat. Patient states that the sore throat tends to be more severe in the mornings and will gradually improve throughout the day. Patient states she has been taking some Tylenol and ibuprofen over last few days but took nothing for pain today. Patient states pain is aggravated by swallowing and eating but denies difficulty in swallowing or eating. She denies fevers, chills, headache, stiff neck, runny nose, earache, cough, chest pain, shortness of breath, abdominal pain, nausea, vomiting.  Past Medical History  Diagnosis Date  . Anemia   . Depression   . Migraines   . UTI (urinary tract infection)   . Migraine   . Thyroid disease     Past Surgical History  Procedure Date  . Appendectomy   . Tonsillectomy and adenoidectomy   . Tonsillectomy 2001  . Tonsillectomy     Family History  Problem Relation Age of Onset  . Arthritis Neg Hx     family  . Hypertension Mother   . Hyperlipidemia Father   . Diabetes Father   . Hypertension Father     History  Substance Use Topics  . Smoking status: Never Smoker   . Smokeless tobacco: Never Used  . Alcohol Use: 0.6 oz/week    1 Glasses of wine per week    OB History    Grav Para Term Preterm Abortions TAB SAB Ect Mult Living                  Review of Systems  Allergies  Aspirin; Bactrim; and Sulfa antibiotics  Home Medications   Current Outpatient Rx  Name Route Sig Dispense Refill  . ELETRIPTAN HYDROBROMIDE 40 MG PO TABS Oral One tablet by mouth at onset of headache. May repeat in 2 hours if headache persists or recurs. may repeat in 2 hours if necessary 10  tablet 3  . LEVOTHYROXINE SODIUM 175 MCG PO TABS Oral Take 1 tablet (175 mcg total) by mouth daily. 90 tablet 3  . TOPIRAMATE 25 MG PO TABS Oral Take 50 mg by mouth at bedtime.    . IBUPROFEN 800 MG PO TABS Oral Take 1 tablet (800 mg total) by mouth 3 (three) times daily. Take 800mg  by mouth at breakfast, lunch and dinner for the next 5 days 21 tablet 0  . LEVONORGESTREL 20 MCG/24HR IU IUD Intrauterine 1 each by Intrauterine route once. Inserted in 2009      BP 144/69  Pulse 79  Temp 98.3 F (36.8 C) (Oral)  Resp 18  SpO2 100%  LMP 04/23/2012  Physical Exam  Vitals reviewed. Constitutional: She is oriented to person, place, and time. She appears well-developed and well-nourished. No distress.  HENT:  Head: Normocephalic and atraumatic.  Right Ear: External ear normal.  Left Ear: External ear normal.  Nose: Nose normal.  Mouth/Throat: No oropharyngeal exudate.       Mild erythema of posterior pharynx and tonsils absent. Patent airway. Swallowing secretions well  Post nasal drainage into pharynx with cobbling.   Eyes: Conjunctivae and EOM are normal. Pupils are equal, round, and reactive to light.  Neck: Normal range of motion. Neck supple.  Cardiovascular: Normal rate,  regular rhythm and normal heart sounds.  Exam reveals no gallop and no friction rub.   No murmur heard. Pulmonary/Chest: Effort normal and breath sounds normal. No respiratory distress. She has no wheezes. She has no rales. She exhibits no tenderness.  Abdominal: Soft. She exhibits no distension and no mass. There is no tenderness. There is no rebound and no guarding.  Lymphadenopathy:    She has no cervical adenopathy.  Neurological: She is alert and oriented to person, place, and time. She has normal reflexes.  Skin: Skin is warm and dry. No rash noted. She is not diaphoretic.  Psychiatric: She has a normal mood and affect.    ED Course  Procedures (including critical care time)  PO ibuprofen and benadryl.     Labs Reviewed - No data to display No results found.   1. Pharyngitis       MDM  Patient is afebrile with patent airway with faint pharyngeal erythema and post nasal drip with no concern strep. Surgically absent tonsils. Likely viral pharyngitis. Will give symptomatic treatment, pcp follow up and reasons to return to ER. Patient voices understanding and is agreeable to plan.         [place], [place] 04/26/12 1951

## 2012-04-26 NOTE — ED Notes (Signed)
Sore throat x 5 days

## 2012-04-28 ENCOUNTER — Ambulatory Visit (INDEPENDENT_AMBULATORY_CARE_PROVIDER_SITE_OTHER)
Admission: RE | Admit: 2012-04-28 | Discharge: 2012-04-28 | Disposition: A | Discharge status: Home / Self Care | Payer: BC Managed Care – PPO | Source: Ambulatory Visit | Attending: Internal Medicine | Admitting: Internal Medicine

## 2012-04-28 ENCOUNTER — Encounter: Payer: Self-pay | Admitting: Family Medicine

## 2012-04-28 ENCOUNTER — Ambulatory Visit (INDEPENDENT_AMBULATORY_CARE_PROVIDER_SITE_OTHER): Payer: BC Managed Care – PPO | Admitting: Internal Medicine

## 2012-04-28 ENCOUNTER — Encounter: Payer: Self-pay | Admitting: Internal Medicine

## 2012-04-28 ENCOUNTER — Telehealth: Payer: Self-pay | Admitting: Family Medicine

## 2012-04-28 VITALS — BP 130/90 | HR 96 | Temp 98.4°F | Wt 170.0 lb

## 2012-04-28 DIAGNOSIS — H1130 Conjunctival hemorrhage, unspecified eye: Secondary | ICD-10-CM

## 2012-04-28 DIAGNOSIS — R509 Fever, unspecified: Secondary | ICD-10-CM | POA: Insufficient documentation

## 2012-04-28 DIAGNOSIS — J040 Acute laryngitis: Secondary | ICD-10-CM

## 2012-04-28 DIAGNOSIS — R1110 Vomiting, unspecified: Secondary | ICD-10-CM

## 2012-04-28 HISTORY — DX: Conjunctival hemorrhage, unspecified eye: H11.30

## 2012-04-28 LAB — CBC WITH DIFFERENTIAL/PLATELET
MCV: 70.4 fl — ABNORMAL LOW (ref 78.0–100.0)
Platelets: 316 10*3/uL (ref 150.0–400.0)
RBC: 4.95 Mil/uL (ref 3.87–5.11)
WBC: 5.8 10*3/uL (ref 4.5–10.5)

## 2012-04-28 LAB — HEPATIC FUNCTION PANEL
ALT: 10 U/L (ref 0–35)
AST: 19 U/L (ref 0–37)
Albumin: 4.1 g/dL (ref 3.5–5.2)
Alkaline Phosphatase: 44 U/L (ref 39–117)
Bilirubin, Direct: 0 mg/dL (ref 0.0–0.3)
Total Protein: 6.7 g/dL (ref 6.0–8.3)

## 2012-04-28 LAB — BASIC METABOLIC PANEL
CO2: 26 mEq/L (ref 19–32)
Calcium: 9 mg/dL (ref 8.4–10.5)
Chloride: 107 mEq/L (ref 96–112)
Glucose, Bld: 88 mg/dL (ref 70–99)
Potassium: 4.3 mEq/L (ref 3.5–5.1)
Sodium: 139 mEq/L (ref 135–145)

## 2012-04-28 MED ORDER — ONDANSETRON 4 MG PO TBDP: 4.0000 mg | ORAL_TABLET | Freq: Three times a day (TID) | ORAL | Status: AC | PRN

## 2012-04-28 NOTE — Patient Instructions (Addendum)
Your exam is normal except for some mildly swollen glands in her neck and the fact that you are sick  Will notify you of laboratory studies when they're available  This still acts like a viral infection but more severe than typical and you need to avoid getting dehydrated Take the antinausea medicine as directed and work on getting small amounts of fluids frequently into your body.  If you're fever is not gone in another 36 hours to 48 hours would have you call for reevaluation.  A small hemorrhage under left eye was probably from vomiting. It is clinically insignificant.  If you cough becomes more prominent we should get a chest x ray.

## 2012-04-28 NOTE — Progress Notes (Signed)
Subjective:    Patient ID: Debbie [name], female    DOB: 11/30/1980, 31 y.o.   MRN: [id number]  HPI Patient comes in today for SDA for  new problem evaluation. pcp  NA this pm.  Actually patient was seen in the emergency room 3 days ago near the onset of this illness. At that time she had a sore throat and hoarseness with no associated symptoms she was told that it was a throat infection nonviral no blood work was done. Since that time she's had a fever 203 104 last night was 103 she's taking antipyretics for it Tylenol and Motrin. There is no associated severe headache she is very hoarse but no nose congestion; but has developed nausea and vomiting over the last day or 2. Today she developed a small hemorrhage on the left lateral eyeball. No other rashes bruising or bleeding. She has some high epigastric pain that she thinks is secondary to vomiting. She does not think she has a urinary tract infection. She has a minor dry cough.    denies exposures sick children or recent travel.  Works in a [employer] is originally from the [place] area She is a migraine IUD she does not feel any of her symptoms her migraine headache.  Review of Systems Outpatient Encounter Prescriptions as of 04/28/2012  Medication Sig Dispense Refill  . eletriptan (RELPAX) 40 MG tablet One tablet by mouth at onset of headache. May repeat in 2 hours if headache persists or recurs. may repeat in 2 hours if necessary  10 tablet  3  . ibuprofen (ADVIL,MOTRIN) 800 MG tablet Take 1 tablet (800 mg total) by mouth 3 (three) times daily. Take 800mg  by mouth at breakfast, lunch and dinner for the next 5 days  21 tablet  0  . levonorgestrel (MIRENA) 20 MCG/24HR IUD 1 each by Intrauterine route once. Inserted in 2009      . levothyroxine (SYNTHROID, LEVOTHROID) 175 MCG tablet Take 1 tablet (175 mcg total) by mouth daily.  90 tablet  3  . topiramate (TOPAMAX) 25 MG tablet Take 50 mg by mouth at bedtime.      Past history family history  social history reviewed in the electronic medical record. Has an IUD     Objective:   Physical Exam BP 130/90  Pulse 96  Temp 98.4 F (36.9 C) (Oral)  Wt 170 lb (77.111 kg)  SpO2 98%  LMP 04/23/2012 Well-developed well-nourished mildly ill nontoxic female in no acute distress looks a bit washed out. HEENT normocephalic TMs clear eyes PERRLA conjunctiva clear except for a small left some conjunctival hemorrhage. No icterus. Nares patent she is moderately hoarse but no stridor oropharynx shows minimal erythema no lesions or edema Neck supple without significant masses she has shoddy a.c. and PC nodes. Chest clear to auscultation and percussion cardiac S1-S2 no gallops murmurs peripheral pulses full without delay. Abdomen soft bowel sounds present liver is at the right costal margin spleen at the left costal margin no tenderness. No clubbing cyanosis or edema Skin: normal capillary refill ,turgor , color: No acute rashes ,petechiae or bruising  Emergency Department chart reviewed diagnosis pharyngitis    Assessment & Plan:   Febrile illness with hoarseness/nausea and vomiting/ today 3-4  Days of high fever. Her cough is minor at this point in her exam is unrevealing. This is probably viral consider adenovirus /mono. Nontoxic appearing at this time. On review the chart after she left it was noted that she did travel to  [place]; was on malaria prophylaxis a few months ago. At this point treatment should be supportive increasing fluids discussed add Zofran as needed relative rest note for work that she can go back in a few days when her fevers cough for 24 hours. Further evaluation followup with her PCP

## 2012-04-28 NOTE — Telephone Encounter (Signed)
Caller: Debbie [name]/Patient; PCP: Evelena Peat; CB#: [phone]; ; ; Call regarding Fever;  Onset- 04/21/12 Temp-103 Pt has fever, sore throat, nasal congestion, nausea, red eye, and  feels dizzy. Emergent s/s of Flu Like s/s protocol r/o. Pt to see provider within 24 hrs. Appt scheduled today with Dr. Fabian Sharp at 2:15pm.

## 2012-04-28 NOTE — ED Provider Notes (Signed)
Medical screening examination/treatment/procedure(s) were performed by non-physician practitioner and as supervising physician I was immediately available for consultation/collaboration.  Raeford Razor, MD 04/28/12 1037

## 2012-04-29 ENCOUNTER — Telehealth: Payer: Self-pay | Admitting: Family Medicine

## 2012-04-29 NOTE — Telephone Encounter (Signed)
Patient is aware of lab results.

## 2012-04-29 NOTE — Telephone Encounter (Signed)
Patient called stating that she would like a call back today with lab results. Labs done by Dr. Fabian Sharp. Please assist.

## 2012-07-03 ENCOUNTER — Telehealth: Payer: Self-pay | Admitting: Family Medicine

## 2012-07-03 NOTE — Telephone Encounter (Signed)
Caller: Klare/Patient; Patient Name: Debbie [name]; PCP: Evelena Peat [facility]); Best Callback Phone Number: [phone]; Call regarding: Migraines; onset 06/29/12, along with nausea and vomiting; also c/o left flank pain and burning; denies blood in urine; using UTI OTC cranberry meds; went to the UC 07/02/12 and was given a shot for the pain; using Topamax with little relief; hasn't checked temp, but has had some chills at night; c/o dizziness; All emergent sxs of Headache protocol r/o except "new onset of vomiting associated with severe HA"; disp see within 4 hrs; instructed to use [facility]; informed her to also have urine checked while she was there

## 2012-07-24 ENCOUNTER — Other Ambulatory Visit (INDEPENDENT_AMBULATORY_CARE_PROVIDER_SITE_OTHER): Payer: BC Managed Care – PPO

## 2012-07-24 DIAGNOSIS — D649 Anemia, unspecified: Secondary | ICD-10-CM

## 2012-07-24 DIAGNOSIS — Z0000 Encounter for general adult medical examination without abnormal findings: Secondary | ICD-10-CM

## 2012-07-24 DIAGNOSIS — E039 Hypothyroidism, unspecified: Secondary | ICD-10-CM

## 2012-07-24 LAB — BASIC METABOLIC PANEL
Chloride: 109 mEq/L (ref 96–112)
GFR: 80.55 mL/min (ref >60.00)
Glucose, Bld: 85 mg/dL (ref 70–99)
Potassium: 3.9 mEq/L (ref 3.5–5.1)
Sodium: 139 mEq/L (ref 135–145)

## 2012-07-24 LAB — LIPID PANEL
Cholesterol: 148 mg/dL (ref 0–200)
LDL Cholesterol: 96 mg/dL (ref 0–99)
VLDL: 11.6 mg/dL (ref 0.0–40.0)

## 2012-07-24 LAB — TSH: TSH: 0.12 u[IU]/mL — ABNORMAL LOW (ref 0.35–5.50)

## 2012-07-24 LAB — POCT URINALYSIS DIPSTICK
Bilirubin, UA: NEGATIVE
Glucose, UA: NEGATIVE
Ketones, UA: NEGATIVE
Leukocytes, UA: NEGATIVE
Nitrite, UA: NEGATIVE

## 2012-07-24 LAB — CBC WITH DIFFERENTIAL/PLATELET
Basophils Absolute: 0 10*3/uL (ref 0.0–0.1)
HCT: 37.2 % (ref 36.0–46.0)
Hemoglobin: 11.3 g/dL — ABNORMAL LOW (ref 12.0–15.0)
Lymphs Abs: 1.1 10*3/uL (ref 0.7–4.0)
MCHC: 30.5 g/dL (ref 30.0–36.0)
MCV: 71.6 fl — ABNORMAL LOW (ref 78.0–100.0)
Monocytes Absolute: 0.5 10*3/uL (ref 0.1–1.0)
Monocytes Relative: 11.8 % (ref 3.0–12.0)
Neutro Abs: 2.7 10*3/uL (ref 1.4–7.7)
Platelets: 359 10*3/uL (ref 150.0–400.0)
RDW: 18.2 % — ABNORMAL HIGH (ref 11.5–14.6)

## 2012-07-24 LAB — HEPATIC FUNCTION PANEL
AST: 17 U/L (ref 0–37)
Alkaline Phosphatase: 41 U/L (ref 39–117)
Bilirubin, Direct: 0.1 mg/dL (ref 0.0–0.3)

## 2012-07-31 ENCOUNTER — Ambulatory Visit (INDEPENDENT_AMBULATORY_CARE_PROVIDER_SITE_OTHER): Payer: BC Managed Care – PPO | Admitting: Family Medicine

## 2012-07-31 ENCOUNTER — Encounter: Payer: Self-pay | Admitting: Family Medicine

## 2012-07-31 ENCOUNTER — Other Ambulatory Visit (HOSPITAL_COMMUNITY)
Admission: RE | Admit: 2012-07-31 | Discharge: 2012-07-31 | Disposition: A | Discharge status: Home / Self Care | Payer: BC Managed Care – PPO | Source: Ambulatory Visit | Attending: Family Medicine | Admitting: Family Medicine

## 2012-07-31 VITALS — BP 120/80 | HR 72 | Temp 99.7°F | Resp 12 | Ht 62.25 in | Wt 174.0 lb

## 2012-07-31 DIAGNOSIS — Z0000 Encounter for general adult medical examination without abnormal findings: Secondary | ICD-10-CM

## 2012-07-31 DIAGNOSIS — Z23 Encounter for immunization: Secondary | ICD-10-CM

## 2012-07-31 DIAGNOSIS — Z01419 Encounter for gynecological examination (general) (routine) without abnormal findings: Secondary | ICD-10-CM | POA: Insufficient documentation

## 2012-07-31 DIAGNOSIS — E039 Hypothyroidism, unspecified: Secondary | ICD-10-CM

## 2012-07-31 MED ORDER — SYNTHROID 150 MCG PO TABS: 150.0000 ug | ORAL_TABLET | Freq: Every day | ORAL | Status: DC

## 2012-07-31 MED ORDER — IBUPROFEN 800 MG PO TABS: 800.0000 mg | ORAL_TABLET | ORAL | Status: DC | PRN

## 2012-07-31 MED ORDER — PANTOPRAZOLE SODIUM 40 MG PO TBEC: 40.0000 mg | DELAYED_RELEASE_TABLET | Freq: Every day | ORAL | Status: DC

## 2012-07-31 NOTE — Progress Notes (Signed)
Subjective:    Patient ID: Debbie [name], female    DOB: Jan 11, 1981, 31 y.o.   MRN: [id number]  HPI  Patient seen for complete physical. She has history of hypothyroidism and migraine headaches. Migraines have been prophylaxed very well with Topamax. She uses Mirena IUD. She is considered trying to get pregnant within the next year, but not sure yet. She takes Relpax as needed acutely for migraines.  She's had some recent breakthrough GERD symptoms. Previously relieved with Nexium. Currently not on PPI.  She takes levothyroxin 175 mcgs daily. Recent thyroid functions slightly over replaced. Generally not feeling well. Some fatigue issues. No diarrhea. No weight loss issues. Very busy with working full-time and raising 2 children. She has history of mild anemia. Pap smear last year normal. Remote history of HPV.  Past Medical History  Diagnosis Date  . Anemia   . Depression   . Migraines   . UTI (urinary tract infection)   . Migraine   . Thyroid disease    Past Surgical History  Procedure Date  . Appendectomy   . Tonsillectomy and adenoidectomy   . Tonsillectomy 2001  . Tonsillectomy     reports that she has never smoked. She has never used smokeless tobacco. She reports that she drinks about .6 ounces of alcohol per week. She reports that she does not use illicit drugs. family history includes Diabetes in her father; Hyperlipidemia in her father; and Hypertension in her father and mother.  There is no history of Arthritis. Allergies  Allergen Reactions  . Aspirin Hives  . Bactrim Swelling  . Sulfa Antibiotics Swelling      Review of Systems  Constitutional: Positive for fatigue. Negative for fever, activity change, appetite change and unexpected weight change.  HENT: Negative for hearing loss, ear pain, sore throat and trouble swallowing.   Eyes: Negative for visual disturbance.  Respiratory: Negative for cough and shortness of breath.   Cardiovascular: Negative for chest pain  and palpitations.  Gastrointestinal: Negative for abdominal pain, diarrhea, constipation and blood in stool.  Genitourinary: Negative for dysuria and hematuria.  Musculoskeletal: Negative for myalgias, back pain and arthralgias.  Skin: Negative for rash.  Neurological: Negative for dizziness, syncope and headaches.  Hematological: Negative for adenopathy.  Psychiatric/Behavioral: Negative for confusion and dysphoric mood.       Objective:   Physical Exam  Constitutional: She is oriented to person, place, and time. She appears well-developed and well-nourished.  HENT:  Head: Normocephalic and atraumatic.  Eyes: EOM are normal. Pupils are equal, round, and reactive to light.  Neck: Normal range of motion. Neck supple. No thyromegaly present.  Cardiovascular: Normal rate, regular rhythm and normal heart sounds.   No murmur heard. Pulmonary/Chest: Breath sounds normal. No respiratory distress. She has no wheezes. She has no rales.  Abdominal: Soft. Bowel sounds are normal. She exhibits no distension and no mass. There is no tenderness. There is no rebound and no guarding.  Genitourinary: Vagina normal and uterus normal. No vaginal discharge found.       Breast exam normal.  No mass and no skin dimpling or nipple inversion.  Pap obtained.  Normal exam .  No adnexal or uterine mass and no tenderness.  Musculoskeletal: Normal range of motion. She exhibits no edema.  Lymphadenopathy:    She has no cervical adenopathy.  Neurological: She is alert and oriented to person, place, and time. She displays normal reflexes. No cranial nerve deficit.  Skin: No rash noted.  Psychiatric: She has  a normal mood and affect. Her behavior is normal. Judgment and thought content normal.          Assessment & Plan:  Complete physical. Labs reviewed.  Adjusted thyroid dose (over replaced).  Pap sent.  Try to lose some weight.  Start OTC Fe replacement with mild anemia.

## 2012-07-31 NOTE — Patient Instructions (Addendum)
Consider over the counter iron supplement Repeat TSH in 3 months to reassess thyroid

## 2012-08-05 NOTE — Progress Notes (Signed)
Quick Note:  Pt informed ______ 

## 2012-08-12 ENCOUNTER — Encounter (HOSPITAL_BASED_OUTPATIENT_CLINIC_OR_DEPARTMENT_OTHER): Payer: Self-pay | Admitting: Family Medicine

## 2012-08-12 ENCOUNTER — Emergency Department (HOSPITAL_BASED_OUTPATIENT_CLINIC_OR_DEPARTMENT_OTHER)
Admission: EM | Admit: 2012-08-12 | Discharge: 2012-08-12 | Disposition: A | Discharge status: Home / Self Care | Payer: BC Managed Care – PPO | Attending: Emergency Medicine | Admitting: Emergency Medicine

## 2012-08-12 DIAGNOSIS — K5289 Other specified noninfective gastroenteritis and colitis: Secondary | ICD-10-CM | POA: Insufficient documentation

## 2012-08-12 DIAGNOSIS — Z87440 Personal history of urinary (tract) infections: Secondary | ICD-10-CM | POA: Insufficient documentation

## 2012-08-12 DIAGNOSIS — Z8669 Personal history of other diseases of the nervous system and sense organs: Secondary | ICD-10-CM | POA: Insufficient documentation

## 2012-08-12 DIAGNOSIS — K529 Noninfective gastroenteritis and colitis, unspecified: Secondary | ICD-10-CM

## 2012-08-12 DIAGNOSIS — Z8659 Personal history of other mental and behavioral disorders: Secondary | ICD-10-CM | POA: Insufficient documentation

## 2012-08-12 DIAGNOSIS — R109 Unspecified abdominal pain: Secondary | ICD-10-CM | POA: Insufficient documentation

## 2012-08-12 DIAGNOSIS — E079 Disorder of thyroid, unspecified: Secondary | ICD-10-CM | POA: Insufficient documentation

## 2012-08-12 DIAGNOSIS — R197 Diarrhea, unspecified: Secondary | ICD-10-CM | POA: Insufficient documentation

## 2012-08-12 DIAGNOSIS — Z862 Personal history of diseases of the blood and blood-forming organs and certain disorders involving the immune mechanism: Secondary | ICD-10-CM | POA: Insufficient documentation

## 2012-08-12 DIAGNOSIS — Z79899 Other long term (current) drug therapy: Secondary | ICD-10-CM | POA: Insufficient documentation

## 2012-08-12 LAB — URINALYSIS, ROUTINE W REFLEX MICROSCOPIC
Glucose, UA: NEGATIVE mg/dL
Specific Gravity, Urine: 1.028 (ref 1.005–1.030)
pH: 6 (ref 5.0–8.0)

## 2012-08-12 LAB — COMPREHENSIVE METABOLIC PANEL
AST: 17 U/L (ref 0–37)
BUN: 11 mg/dL (ref 6–23)
CO2: 24 mEq/L (ref 19–32)
Chloride: 105 mEq/L (ref 96–112)
Creatinine, Ser: 0.9 mg/dL (ref 0.50–1.10)
GFR calc non Af Amer: 84 mL/min — ABNORMAL LOW (ref >90)
Glucose, Bld: 98 mg/dL (ref 70–99)
Total Bilirubin: 0.9 mg/dL (ref 0.3–1.2)

## 2012-08-12 LAB — CBC
HCT: 33.3 % — ABNORMAL LOW (ref 36.0–46.0)
Hemoglobin: 11.5 g/dL — ABNORMAL LOW (ref 12.0–15.0)
MCV: 62.9 fL — ABNORMAL LOW (ref 78.0–100.0)
Platelets: 333 10*3/uL (ref 150–400)
RBC: 5.29 MIL/uL — ABNORMAL HIGH (ref 3.87–5.11)
WBC: 5.2 10*3/uL (ref 4.0–10.5)

## 2012-08-12 LAB — URINE MICROSCOPIC-ADD ON

## 2012-08-12 MED ORDER — SODIUM CHLORIDE 0.9 % IV BOLUS (SEPSIS)
1000.0000 mL | Freq: Once | INTRAVENOUS | Status: AC
  Administered 2012-08-12: 1000 mL via INTRAVENOUS

## 2012-08-12 MED ORDER — KETOROLAC TROMETHAMINE 30 MG/ML IJ SOLN
30.0000 mg | Freq: Once | INTRAMUSCULAR | Status: AC
  Administered 2012-08-12: 30 mg via INTRAVENOUS
  Filled 2012-08-12: qty 1

## 2012-08-12 MED ORDER — IBUPROFEN 400 MG PO TABS
600.0000 mg | ORAL_TABLET | Freq: Once | ORAL | Status: AC
  Administered 2012-08-12: 600 mg via ORAL
  Filled 2012-08-12: qty 1

## 2012-08-12 MED ORDER — PROMETHAZINE HCL 25 MG PO TABS: 25.0000 mg | ORAL_TABLET | Freq: Four times a day (QID) | ORAL | Status: DC | PRN

## 2012-08-12 MED ORDER — ONDANSETRON HCL 4 MG/2ML IJ SOLN
4.0000 mg | Freq: Once | INTRAMUSCULAR | Status: AC
  Administered 2012-08-12: 4 mg via INTRAVENOUS
  Filled 2012-08-12: qty 2

## 2012-08-12 NOTE — ED Provider Notes (Signed)
History     CSN: [id number]  Arrival date & time 08/12/12  0830   First MD Initiated Contact with Patient 08/12/12 [phone]      Chief Complaint  Patient presents with  . Emesis    (Consider location/radiation/quality/duration/timing/severity/associated sxs/prior treatment) Patient is a 31 y.o. female presenting with vomiting. The history is provided by the patient.  Emesis  This is a new problem. The current episode started yesterday. The problem occurs continuously. The problem has not changed since onset.The emesis has an appearance of stomach contents. There has been no fever. Associated symptoms include abdominal pain and diarrhea. Pertinent negatives include no fever.    Past Medical History  Diagnosis Date  . Anemia   . Depression   . Migraines   . UTI (urinary tract infection)   . Migraine   . Thyroid disease     Past Surgical History  Procedure Date  . Appendectomy   . Tonsillectomy and adenoidectomy   . Tonsillectomy 2001  . Tonsillectomy     Family History  Problem Relation Age of Onset  . Arthritis Neg Hx     family  . Hypertension Mother   . Hyperlipidemia Father   . Diabetes Father   . Hypertension Father     History  Substance Use Topics  . Smoking status: Never Smoker   . Smokeless tobacco: Never Used  . Alcohol Use: 0.6 oz/week    1 Glasses of wine per week    OB History    Grav Para Term Preterm Abortions TAB SAB Ect Mult Living                  Review of Systems  Constitutional: Negative for fever.  Gastrointestinal: Positive for vomiting, abdominal pain and diarrhea.  All other systems reviewed and are negative.    Allergies  Aspirin; Bactrim; and Sulfa antibiotics  Home Medications   Current Outpatient Rx  Name  Route  Sig  Dispense  Refill  . ELETRIPTAN HYDROBROMIDE 40 MG PO TABS   Oral   One tablet by mouth at onset of headache. May repeat in 2 hours if headache persists or recurs. may repeat in 2 hours if necessary  10 tablet   3   . IBUPROFEN 800 MG PO TABS   Oral   Take 1 tablet (800 mg total) by mouth as needed. For headache during the day   30 tablet   2   . LEVONORGESTREL 20 MCG/24HR IU IUD   Intrauterine   1 each by Intrauterine route once. Inserted in 2009         . PANTOPRAZOLE SODIUM 40 MG PO TBEC   Oral   Take 1 tablet (40 mg total) by mouth daily.   30 tablet   6   . SYNTHROID 150 MCG PO TABS   Oral   Take 1 tablet (150 mcg total) by mouth daily.   30 tablet   11     Dispense as written.    Brand name only   . TOPIRAMATE 25 MG PO TABS   Oral   Take 50 mg by mouth at bedtime.           BP 116/87  Pulse 108  Temp 97.9 F (36.6 C) (Oral)  Resp 20  SpO2 100%  LMP 08/09/2012  Physical Exam  Nursing note and vitals reviewed. Constitutional: She is oriented to person, place, and time. She appears well-developed and well-nourished. No distress.  HENT:  Head: Normocephalic and  atraumatic.  Mouth/Throat: Oropharynx is clear and moist.  Neck: Normal range of motion. Neck supple.  Cardiovascular: Normal rate and regular rhythm.  Exam reveals no gallop and no friction rub.   No murmur heard. Pulmonary/Chest: Effort normal and breath sounds normal. No respiratory distress. She has no wheezes.  Abdominal: Soft. Bowel sounds are normal. She exhibits no distension. There is no tenderness.  Musculoskeletal: Normal range of motion.  Neurological: She is alert and oriented to person, place, and time.  Skin: Skin is warm and dry. She is not diaphoretic.    ED Course  Procedures (including critical care time)   Labs Reviewed  PREGNANCY, URINE  URINALYSIS, ROUTINE W REFLEX MICROSCOPIC   No results found.   No diagnosis found.    MDM  The presentation, exam, and labs are all consistent with a viral etiology.  She was given fluids and meds and is feeling better.  She is to return if she worsens or develops bloody stool.        Geoffery Lyons, MD 08/12/12  1016

## 2012-08-12 NOTE — ED Notes (Signed)
Chart reviewed.

## 2012-08-12 NOTE — ED Notes (Signed)
Pt c/o n/v x 3 days and diarrhea x 1 day.

## 2012-08-14 LAB — URINE CULTURE: Colony Count: 8000

## 2012-10-08 ENCOUNTER — Encounter (HOSPITAL_BASED_OUTPATIENT_CLINIC_OR_DEPARTMENT_OTHER): Payer: Self-pay

## 2012-10-08 ENCOUNTER — Emergency Department (HOSPITAL_BASED_OUTPATIENT_CLINIC_OR_DEPARTMENT_OTHER): Payer: BC Managed Care – PPO

## 2012-10-08 ENCOUNTER — Emergency Department (HOSPITAL_BASED_OUTPATIENT_CLINIC_OR_DEPARTMENT_OTHER)
Admission: EM | Admit: 2012-10-08 | Discharge: 2012-10-08 | Disposition: A | Discharge status: Home / Self Care | Payer: BC Managed Care – PPO | Attending: Emergency Medicine | Admitting: Emergency Medicine

## 2012-10-08 DIAGNOSIS — Y92410 Unspecified street and highway as the place of occurrence of the external cause: Secondary | ICD-10-CM | POA: Insufficient documentation

## 2012-10-08 DIAGNOSIS — Z79899 Other long term (current) drug therapy: Secondary | ICD-10-CM | POA: Insufficient documentation

## 2012-10-08 DIAGNOSIS — S0990XA Unspecified injury of head, initial encounter: Secondary | ICD-10-CM | POA: Insufficient documentation

## 2012-10-08 DIAGNOSIS — Z8679 Personal history of other diseases of the circulatory system: Secondary | ICD-10-CM | POA: Insufficient documentation

## 2012-10-08 DIAGNOSIS — Z862 Personal history of diseases of the blood and blood-forming organs and certain disorders involving the immune mechanism: Secondary | ICD-10-CM | POA: Insufficient documentation

## 2012-10-08 DIAGNOSIS — E079 Disorder of thyroid, unspecified: Secondary | ICD-10-CM | POA: Insufficient documentation

## 2012-10-08 DIAGNOSIS — Y9389 Activity, other specified: Secondary | ICD-10-CM | POA: Insufficient documentation

## 2012-10-08 DIAGNOSIS — Z87440 Personal history of urinary (tract) infections: Secondary | ICD-10-CM | POA: Insufficient documentation

## 2012-10-08 DIAGNOSIS — Z8659 Personal history of other mental and behavioral disorders: Secondary | ICD-10-CM | POA: Insufficient documentation

## 2012-10-08 MED ORDER — HYDROCODONE-ACETAMINOPHEN 5-325 MG PO TABS: 2.0000 | ORAL_TABLET | Freq: Four times a day (QID) | ORAL | Status: DC | PRN

## 2012-10-08 MED ORDER — FENTANYL CITRATE 0.05 MG/ML IJ SOLN
50.0000 ug | Freq: Once | INTRAMUSCULAR | Status: AC
  Administered 2012-10-08: 50 ug via INTRAVENOUS
  Filled 2012-10-08: qty 2

## 2012-10-08 MED ORDER — SODIUM CHLORIDE 0.9 % IV SOLN
Freq: Once | INTRAVENOUS | Status: AC
  Administered 2012-10-08: 10:00:00 via INTRAVENOUS

## 2012-10-08 MED ORDER — CYCLOBENZAPRINE HCL 10 MG PO TABS: 10.0000 mg | ORAL_TABLET | Freq: Three times a day (TID) | ORAL | Status: DC | PRN

## 2012-10-08 NOTE — ED Notes (Signed)
Returned from CT.

## 2012-10-08 NOTE — ED Notes (Addendum)
MD at bedside. 

## 2012-10-08 NOTE — ED Notes (Signed)
Patient transported to CT 
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
## 2013-01-14 ENCOUNTER — Telehealth: Payer: Self-pay | Admitting: Family Medicine

## 2013-01-14 NOTE — Telephone Encounter (Signed)
Patient Information:  Caller Name: Carlette  Phone: [phone]  Patient: Debbie [name], Debbie [name]  Gender: Female  DOB: [birth year]  Age: 32 Years  PCP: Evelena Peat [facility])  Pregnant: No  Office Follow Up:  Does the office need to follow up with this patient?: Yes  Instructions For The Office: Please review information and follow up with patient per Headache protocol. Thank you.   Symptoms  Reason For Call & Symptoms: Migraine headaches, recurrent.  Pain rated at 9 of 10 with history of migraines.  She states she is not taking Topimax as she is trying to conceive and was instructed to stop medication by her OB/GYN.  Reviewed Health History In EMR: Yes  Reviewed Medications In EMR: Yes  Reviewed Allergies In EMR: Yes  Reviewed Surgeries / Procedures: Yes  Date of Onset of Symptoms: 01/13/2013  Treatments Tried: Motrin 800 mg, Hydrocodone  Treatments Tried Worked: No OB / GYN:  LMP: 12/23/2012  Guideline(s) Used:  Headache  Disposition Per Guideline:   Callback by PCP or Subspecialist within 1 Hour  Reason For Disposition Reached:   Severe headache and not relieved by pain meds  Advice Given:  Reassurance - Migraine Headache:  You have told me that this headache is similar to previous migraine headaches that you have had. If the pattern or severity of your headache changes, you will need to see your physician.  Migraine headaches are also called vascular headaches. A migraine can be anywhere from mild to severely painful. Sufferers often describe it as throbbing or pulsing. It is often just on one side. Associated symptoms include nausea and vomiting. Some individuals will have visual or other neurological warning symptoms (aura) that a migraine is coming.  Migraine Medication:   If your doctor has prescribed specific medication for your migraine, take it as directed as soon as the migraine starts.  Rest:   Lie down in a dark, quiet place and try to relax. Close your eyes  and imagine your entire body relaxing.  Apply Cold to the Area:   Apply a cold wet washcloth or cold pack to the forehead for 20 minutes.  Stretching:   Stretch and massage any tight neck muscles.  Call Back If:  Headache lasts longer than 24 hours  You become worse.  Patient Will Follow Care Advice:  YES

## 2013-01-18 ENCOUNTER — Telehealth: Payer: Self-pay | Admitting: Family Medicine

## 2013-01-18 NOTE — Telephone Encounter (Signed)
[facility] Triage Call Report Triage Record Num: [id number] Operator: Kelle Darting Patient Name: Debbie [name] Call Date & Time: 01/14/2013 5:29:07PM Patient Phone: [phone] PCP: Evelena Peat Patient Gender: Female PCP Fax : [phone] Patient DOB: 10-Jan-1981 [facility] Reason for Call: Caller: [name]/Patient; PCP: Evelena Peat (Family Practice); CB#: [phone]; Call regarding Migraine; afebrile; LMP: 12/23/12; Onset: 01/13/13; Sx notes: Patient had called earlier, was triaged and was to get a call back but states she is home now and she had given her work number; Headache since yesterday, takes with Ibuprofen with help but then it comes back, headache is frontal at this time, earlier in the day it was on the left side; states it does feel like her normal migraines, has been off of Topamax since December 2013 due to her trying to get pregnant; gets dizzy at times but not severe, still able to drive, also getting nauseous lately, no vomiting, has not had anything for pain since last night, states she was afraid to take anything this morning due to waking up with the side effects of the Hydrocodone; Guideline used: Headache; Disposition; See provider within 24 hours due to typical headache AND usual therapy is not available or is not working; Doctor, hospital with Dr. Cato Mulligan, informed of same, states that she can take Ibuprofen every 6 hours and Tylenol every 6 hours, call office in morning for an appt.; Patient informed of same, voice understanding of all; Standing order for Zofran called into [facility] in [place], spoke with Emily/Pharmacist, order given for Zofran 4-8mg  PO every 8 hours PRN for nausea; Quantity of 6 with no refills; Patient informed of same; Appt. made: No, will call office in morning to make an appt. at the [facility] office due to [facility] being closed on 01/15/13. Protocol(s) Used: Headache Recommended Outcome per Protocol: See  Provider within 24 hours Reason for Outcome: Typical headache AND usual therapy is not available or is not working Care Advice: ~ 04/17/

## 2013-02-11 ENCOUNTER — Other Ambulatory Visit (INDEPENDENT_AMBULATORY_CARE_PROVIDER_SITE_OTHER): Payer: BC Managed Care – PPO

## 2013-02-11 DIAGNOSIS — Z0000 Encounter for general adult medical examination without abnormal findings: Secondary | ICD-10-CM

## 2013-02-11 LAB — CBC WITH DIFFERENTIAL/PLATELET
Platelets: 265 10*3/uL (ref 150.0–400.0)
WBC: 5.4 10*3/uL (ref 4.5–10.5)

## 2013-02-11 LAB — POCT URINALYSIS DIPSTICK
Bilirubin, UA: NEGATIVE
Blood, UA: NEGATIVE
Ketones, UA: NEGATIVE
Leukocytes, UA: NEGATIVE
Protein, UA: NEGATIVE
Spec Grav, UA: 1.02
pH, UA: 7

## 2013-02-11 LAB — BASIC METABOLIC PANEL
BUN: 14 mg/dL (ref 6–23)
Creatinine, Ser: 0.8 mg/dL (ref 0.4–1.2)
GFR: 88.42 mL/min (ref >60.00)
Potassium: 3.9 mEq/L (ref 3.5–5.1)

## 2013-02-11 LAB — HEPATIC FUNCTION PANEL
AST: 16 U/L (ref 0–37)
Alkaline Phosphatase: 37 U/L — ABNORMAL LOW (ref 39–117)
Bilirubin, Direct: 0 mg/dL (ref 0.0–0.3)
Total Bilirubin: 0.7 mg/dL (ref 0.3–1.2)

## 2013-02-11 LAB — LIPID PANEL
HDL: 40.6 mg/dL (ref >39.00)
LDL Cholesterol: 111 mg/dL — ABNORMAL HIGH (ref 0–99)
Total CHOL/HDL Ratio: 4
VLDL: 14.2 mg/dL (ref 0.0–40.0)

## 2013-02-11 LAB — HCG, QUANTITATIVE, PREGNANCY: hCG, Beta Chain, Quant, S: 0.37 m[IU]/mL

## 2013-02-11 NOTE — Addendum Note (Signed)
Addended by: Rossie Muskrat K on: 02/11/2013 10:30 AM   Modules accepted: Orders

## 2013-02-16 ENCOUNTER — Ambulatory Visit (INDEPENDENT_AMBULATORY_CARE_PROVIDER_SITE_OTHER): Payer: BC Managed Care – PPO | Admitting: Family Medicine

## 2013-02-16 ENCOUNTER — Encounter: Payer: Self-pay | Admitting: Family Medicine

## 2013-02-16 VITALS — BP 100/70 | HR 72 | Temp 98.6°F | Resp 12 | Ht 63.0 in | Wt 166.0 lb

## 2013-02-16 DIAGNOSIS — E039 Hypothyroidism, unspecified: Secondary | ICD-10-CM

## 2013-02-16 DIAGNOSIS — I83813 Varicose veins of bilateral lower extremities with pain: Secondary | ICD-10-CM

## 2013-02-16 DIAGNOSIS — L989 Disorder of the skin and subcutaneous tissue, unspecified: Secondary | ICD-10-CM

## 2013-02-16 DIAGNOSIS — G43909 Migraine, unspecified, not intractable, without status migrainosus: Secondary | ICD-10-CM

## 2013-02-16 DIAGNOSIS — I83893 Varicose veins of bilateral lower extremities with other complications: Secondary | ICD-10-CM

## 2013-02-16 DIAGNOSIS — I83819 Varicose veins of unspecified lower extremities with pain: Secondary | ICD-10-CM | POA: Insufficient documentation

## 2013-02-16 MED ORDER — PROPRANOLOL HCL ER 60 MG PO CP24: 60.0000 mg | ORAL_CAPSULE | Freq: Every day | ORAL | Status: DC

## 2013-02-16 NOTE — Progress Notes (Signed)
Subjective:    Patient ID: Debbie [name], female    DOB: 1980-12-16, 32 y.o.   MRN: [id number]  HPI Pt here for several items: Hx of hypothyroidism and over replaced last fall. We adjusted dose of medication and recent TSH now normal range. Pap last November was normal. Tetanus is up to date.  She recently saw a gynecologist. She had IUD removed. Because of this, her Topamax for migraine prevention was discontinued. She been doing very well Topamax for prevention. She is now having several migraine headaches per month generally over 6.  She's also having increasingly painful varicose veins. Especially involvement left calf. She sometimes elevates legs. No consistent use of compression garments. She expresses interest in having these further evaluated by specialist.  Warty lesion right shoulder.  Not growing or symptomatic other than irritated because of location with bra strap.  Past Medical History  Diagnosis Date  . Anemia   . Depression   . Migraines   . UTI (urinary tract infection)   . Migraine   . Thyroid disease    Past Surgical History  Procedure Laterality Date  . Appendectomy    . Tonsillectomy and adenoidectomy    . Tonsillectomy  2001  . Tonsillectomy      reports that she has never smoked. She has never used smokeless tobacco. She reports that she drinks about 0.6 ounces of alcohol per week. She reports that she does not use illicit drugs. family history includes Diabetes in her father; Hyperlipidemia in her father; and Hypertension in her father and mother.  There is no history of Arthritis. Allergies  Allergen Reactions  . Aspirin Hives  . Bactrim Swelling  . Sulfa Antibiotics Swelling       Review of Systems  Constitutional: Negative for fever, activity change, appetite change, fatigue and unexpected weight change.  HENT: Negative for hearing loss, ear pain, sore throat and trouble swallowing.   Eyes: Negative for visual disturbance.  Respiratory:  Negative for cough and shortness of breath.   Cardiovascular: Positive for leg swelling (varicosities as per history of present illness). Negative for chest pain and palpitations.  Gastrointestinal: Negative for abdominal pain, diarrhea, constipation and blood in stool.  Genitourinary: Negative for dysuria and hematuria.  Musculoskeletal: Negative for myalgias, back pain and arthralgias.  Skin: Negative for rash.  Neurological: Positive for headaches. Negative for dizziness and syncope.  Hematological: Negative for adenopathy.  Psychiatric/Behavioral: Negative for confusion and dysphoric mood.       Objective:   Physical Exam  Constitutional: She is oriented to person, place, and time. She appears well-developed and well-nourished.  HENT:  Right Ear: External ear normal.  Left Ear: External ear normal.  Mouth/Throat: Oropharynx is clear and moist.  Neck: Neck supple. No thyromegaly present.  Cardiovascular: Normal rate and regular rhythm.   Pulmonary/Chest: Effort normal and breath sounds normal. No respiratory distress. She has no wheezes. She has no rales.  Abdominal: Soft. Bowel sounds are normal. She exhibits no distension and no mass. There is no tenderness. There is no rebound and no guarding.  Musculoskeletal: She exhibits no edema.  Patient has some fairly prominent varicosities especially left calf. These are minimally tender to palpation.  Lymphadenopathy:    She has no cervical adenopathy.  Neurological: She is alert and oriented to person, place, and time. No cranial nerve deficit.          Assessment & Plan:  #1 painful varicose veins. Referral to vein and vascular surgery for further evaluation.  We discussed the importance of compression garments though she is reluctant to wear. #2 migraine headaches. Increased after discontinuation of Topamax. Try Inderal LA 60 mg 1 daily #3 benign appearing verrucous lesion right upper shoulder. Schedule for excision #4  hypothyroidism. Labs reviewed. TSH at goal

## 2013-03-08 ENCOUNTER — Telehealth: Payer: Self-pay | Admitting: Family Medicine

## 2013-03-08 NOTE — Telephone Encounter (Signed)
Pt is just found out she is [redacted] weeks pregnant, and wants to know if its ok for her to come and get the mole removed she is scheduled for on Wed, 6/11?  Pls advise.

## 2013-03-08 NOTE — Telephone Encounter (Signed)
No problem.

## 2013-03-08 NOTE — Telephone Encounter (Signed)
I don't see that being a problem, do you?

## 2013-03-09 ENCOUNTER — Encounter: Payer: Self-pay | Admitting: Vascular Surgery

## 2013-03-09 ENCOUNTER — Other Ambulatory Visit: Payer: Self-pay

## 2013-03-09 DIAGNOSIS — I83893 Varicose veins of bilateral lower extremities with other complications: Secondary | ICD-10-CM

## 2013-03-09 NOTE — Telephone Encounter (Signed)
Pt informed OK

## 2013-03-10 ENCOUNTER — Ambulatory Visit (INDEPENDENT_AMBULATORY_CARE_PROVIDER_SITE_OTHER): Payer: BC Managed Care – PPO | Admitting: Family Medicine

## 2013-03-10 ENCOUNTER — Encounter: Payer: Self-pay | Admitting: Family Medicine

## 2013-03-10 VITALS — BP 130/80 | Temp 98.8°F

## 2013-03-10 DIAGNOSIS — D235 Other benign neoplasm of skin of trunk: Secondary | ICD-10-CM

## 2013-03-10 NOTE — Patient Instructions (Signed)
Keep wound dry for the first 24 hours then clean daily with soap and water for one week. Apply topical antibiotic daily for 3-4 days. Keep covered with clean dressing for 4-5 days. Follow up promptly for any signs of infection such as redness, warmth, pain, or drainage.  

## 2013-03-10 NOTE — Progress Notes (Signed)
  Subjective:    Patient ID: Debbie [name], female    DOB: 30-Apr-1981, 32 y.o.   MRN: [id number]  HPI Patient has large dermal nevus right upper back She is requesting excision. This is bothersome because of location Frequently rubs against her bra strap. No atypical features. No recent changes. No prior history of skin cancer.  Patient has just discovered that she is pregnant. She takes propranolol for headache (migraine) prophylaxis Also remains on Synthroid for hypothyroidism. Recent TSH normal.  she has pending appointment with obstetrician   Review of Systems  Respiratory: Negative for shortness of breath.   Cardiovascular: Negative for chest pain.  Neurological: Negative for dizziness.       Objective:   Physical Exam  Constitutional: She appears well-developed and well-nourished.  Cardiovascular: Normal rate and regular rhythm.   Pulmonary/Chest: Effort normal and breath sounds normal. No respiratory distress. She has no wheezes. She has no rales.  Skin:  Patient has fairly large dermal type nevus right upper back region. This is approximately 9 mm diameter. Well-demarcated borders. Homogenous brown color and somewhat verrucous surface          Assessment & Plan:  Benign-appearing dermal nevus right upper back. Discussed risks and benefits of shave excision including risks of bleeding, bruising, scar formation, and infection and patient consented to proceed.  Skin prepped with betadine and alcohol and shave excision of lesion with #15 blade with minimal bleeding.  Patient tolerated well.  Antibiotic and dressing  applied. Specimen sent to pathology for further evaluation

## 2013-03-15 NOTE — Progress Notes (Signed)
Quick Note:  Pt informed ______ 

## 2013-03-23 ENCOUNTER — Other Ambulatory Visit: Payer: Self-pay | Admitting: Obstetrics and Gynecology

## 2013-04-27 ENCOUNTER — Other Ambulatory Visit: Payer: Self-pay | Admitting: Obstetrics and Gynecology

## 2013-04-27 ENCOUNTER — Ambulatory Visit (HOSPITAL_COMMUNITY)
Admission: AD | Admit: 2013-04-27 | Discharge: 2013-04-27 | Disposition: A | Discharge status: Home / Self Care | Payer: BC Managed Care – PPO | Source: Ambulatory Visit | Attending: Obstetrics and Gynecology | Admitting: Obstetrics and Gynecology

## 2013-04-27 ENCOUNTER — Ambulatory Visit (HOSPITAL_COMMUNITY): Payer: BC Managed Care – PPO | Admitting: Anesthesiology

## 2013-04-27 ENCOUNTER — Encounter (HOSPITAL_COMMUNITY)
Admission: AD | Disposition: A | Discharge status: Home / Self Care | Payer: Self-pay | Source: Ambulatory Visit | Attending: Obstetrics and Gynecology

## 2013-04-27 ENCOUNTER — Encounter (HOSPITAL_COMMUNITY): Payer: Self-pay | Admitting: *Deleted

## 2013-04-27 ENCOUNTER — Encounter (HOSPITAL_COMMUNITY): Payer: Self-pay | Admitting: Anesthesiology

## 2013-04-27 DIAGNOSIS — O021 Missed abortion: Secondary | ICD-10-CM

## 2013-04-27 DIAGNOSIS — G43909 Migraine, unspecified, not intractable, without status migrainosus: Secondary | ICD-10-CM | POA: Insufficient documentation

## 2013-04-27 DIAGNOSIS — Z882 Allergy status to sulfonamides status: Secondary | ICD-10-CM | POA: Insufficient documentation

## 2013-04-27 DIAGNOSIS — D649 Anemia, unspecified: Secondary | ICD-10-CM | POA: Insufficient documentation

## 2013-04-27 DIAGNOSIS — Z79899 Other long term (current) drug therapy: Secondary | ICD-10-CM | POA: Insufficient documentation

## 2013-04-27 DIAGNOSIS — Z886 Allergy status to analgesic agent status: Secondary | ICD-10-CM | POA: Insufficient documentation

## 2013-04-27 DIAGNOSIS — F3289 Other specified depressive episodes: Secondary | ICD-10-CM | POA: Insufficient documentation

## 2013-04-27 DIAGNOSIS — I8390 Asymptomatic varicose veins of unspecified lower extremity: Secondary | ICD-10-CM | POA: Insufficient documentation

## 2013-04-27 DIAGNOSIS — F329 Major depressive disorder, single episode, unspecified: Secondary | ICD-10-CM | POA: Insufficient documentation

## 2013-04-27 DIAGNOSIS — I739 Peripheral vascular disease, unspecified: Secondary | ICD-10-CM | POA: Insufficient documentation

## 2013-04-27 DIAGNOSIS — E039 Hypothyroidism, unspecified: Secondary | ICD-10-CM | POA: Insufficient documentation

## 2013-04-27 HISTORY — PX: DILATION AND EVACUATION: SHX1459

## 2013-04-27 LAB — CBC
Hemoglobin: 11.3 g/dL — ABNORMAL LOW (ref 12.0–15.0)
MCH: 22.4 pg — ABNORMAL LOW (ref 26.0–34.0)
MCHC: 33.4 g/dL (ref 30.0–36.0)
MCV: 67.1 fL — ABNORMAL LOW (ref 78.0–100.0)
RBC: 5.04 MIL/uL (ref 3.87–5.11)

## 2013-04-27 LAB — ABO/RH: ABO/RH(D): A NEG

## 2013-04-27 SURGERY — DILATION AND EVACUATION, UTERUS
Anesthesia: General | Site: Uterus | Wound class: Clean Contaminated

## 2013-04-27 MED ORDER — ONDANSETRON HCL 4 MG/2ML IJ SOLN
INTRAMUSCULAR | Status: AC
  Filled 2013-04-27: qty 2

## 2013-04-27 MED ORDER — PROPOFOL 10 MG/ML IV BOLUS
INTRAVENOUS | Status: DC | PRN
  Administered 2013-04-27: 200 mg via INTRAVENOUS

## 2013-04-27 MED ORDER — METHYLERGONOVINE MALEATE 0.2 MG/ML IJ SOLN
INTRAMUSCULAR | Status: DC | PRN
  Administered 2013-04-27: 0.2 mg via INTRAMUSCULAR

## 2013-04-27 MED ORDER — RHO D IMMUNE GLOBULIN 1500 UNIT/2ML IJ SOLN
300.0000 ug | Freq: Once | INTRAMUSCULAR | Status: AC
  Administered 2013-04-27: 300 ug via INTRAVENOUS
  Filled 2013-04-27: qty 2

## 2013-04-27 MED ORDER — LIDOCAINE HCL (CARDIAC) 20 MG/ML IV SOLN
INTRAVENOUS | Status: AC
  Filled 2013-04-27: qty 5

## 2013-04-27 MED ORDER — PROPOFOL 10 MG/ML IV EMUL
INTRAVENOUS | Status: AC
  Filled 2013-04-27: qty 20

## 2013-04-27 MED ORDER — OXYCODONE-ACETAMINOPHEN 5-325 MG PO TABS
ORAL_TABLET | ORAL | Status: AC
  Administered 2013-04-27: 1 via ORAL
  Filled 2013-04-27: qty 1

## 2013-04-27 MED ORDER — LIDOCAINE HCL (CARDIAC) 20 MG/ML IV SOLN
INTRAVENOUS | Status: DC | PRN
  Administered 2013-04-27: 50 mg via INTRAVENOUS
  Administered 2013-04-27: 30 mg via INTRAVENOUS

## 2013-04-27 MED ORDER — FENTANYL CITRATE 0.05 MG/ML IJ SOLN
INTRAMUSCULAR | Status: DC | PRN
  Administered 2013-04-27: 100 ug via INTRAVENOUS

## 2013-04-27 MED ORDER — DIPHENHYDRAMINE HCL 50 MG/ML IJ SOLN: 12.5000 mg | Freq: Once | INTRAMUSCULAR | Status: AC

## 2013-04-27 MED ORDER — METOCLOPRAMIDE HCL 5 MG/ML IJ SOLN: 10.0000 mg | Freq: Once | INTRAMUSCULAR | Status: DC | PRN

## 2013-04-27 MED ORDER — ONDANSETRON HCL 4 MG/2ML IJ SOLN
INTRAMUSCULAR | Status: DC | PRN
  Administered 2013-04-27: 4 mg via INTRAVENOUS

## 2013-04-27 MED ORDER — MEPERIDINE HCL 25 MG/ML IJ SOLN: 6.2500 mg | INTRAMUSCULAR | Status: DC | PRN

## 2013-04-27 MED ORDER — FENTANYL CITRATE 0.05 MG/ML IJ SOLN
25.0000 ug | INTRAMUSCULAR | Status: DC | PRN
  Administered 2013-04-27: 50 ug via INTRAVENOUS
  Administered 2013-04-27: 25 ug via INTRAVENOUS

## 2013-04-27 MED ORDER — FENTANYL CITRATE 0.05 MG/ML IJ SOLN
INTRAMUSCULAR | Status: AC
  Filled 2013-04-27: qty 2

## 2013-04-27 MED ORDER — MIDAZOLAM HCL 2 MG/2ML IJ SOLN
INTRAMUSCULAR | Status: AC
  Filled 2013-04-27: qty 2

## 2013-04-27 MED ORDER — LACTATED RINGERS IV SOLN
INTRAVENOUS | Status: DC
  Administered 2013-04-27 (×2): via INTRAVENOUS

## 2013-04-27 MED ORDER — OXYCODONE-ACETAMINOPHEN 5-325 MG PO TABS: 1.0000 | ORAL_TABLET | ORAL | Status: DC | PRN

## 2013-04-27 MED ORDER — DIPHENHYDRAMINE HCL 50 MG/ML IJ SOLN
INTRAMUSCULAR | Status: AC
  Administered 2013-04-27: 12.5 mg via INTRAVENOUS
  Filled 2013-04-27: qty 1

## 2013-04-27 MED ORDER — DEXAMETHASONE SODIUM PHOSPHATE 4 MG/ML IJ SOLN
INTRAMUSCULAR | Status: DC | PRN
  Administered 2013-04-27: 10 mg via INTRAVENOUS

## 2013-04-27 MED ORDER — MIDAZOLAM HCL 5 MG/5ML IJ SOLN
INTRAMUSCULAR | Status: DC | PRN
Start: 2013-04-27 — End: 2013-04-27
  Administered 2013-04-27: 2 mg via INTRAVENOUS

## 2013-04-27 MED ORDER — DEXAMETHASONE SODIUM PHOSPHATE 10 MG/ML IJ SOLN
INTRAMUSCULAR | Status: AC
  Filled 2013-04-27: qty 1

## 2013-04-27 MED ORDER — FENTANYL CITRATE 0.05 MG/ML IJ SOLN
INTRAMUSCULAR | Status: AC
  Administered 2013-04-27: 50 ug via INTRAVENOUS
  Filled 2013-04-27: qty 2

## 2013-04-27 SURGICAL SUPPLY — 17 items
CATH ROBINSON RED A/P 16FR (CATHETERS) ×2 IMPLANT
CLOTH BEACON ORANGE TIMEOUT ST (SAFETY) ×2 IMPLANT
DECANTER SPIKE VIAL GLASS SM (MISCELLANEOUS) ×2 IMPLANT
GLOVE BIO SURGEON STRL SZ7 (GLOVE) ×2 IMPLANT
GOWN STRL REIN XL XLG (GOWN DISPOSABLE) ×4 IMPLANT
KIT BERKELEY 1ST TRIMESTER 3/8 (MISCELLANEOUS) ×2 IMPLANT
NS IRRIG 1000ML POUR BTL (IV SOLUTION) ×2 IMPLANT
PACK VAGINAL MINOR WOMEN LF (CUSTOM PROCEDURE TRAY) ×2 IMPLANT
PAD OB MATERNITY 4.3X12.25 (PERSONAL CARE ITEMS) ×2 IMPLANT
PAD PREP 24X48 CUFFED NSTRL (MISCELLANEOUS) ×2 IMPLANT
SET BERKELEY SUCTION TUBING (SUCTIONS) ×2 IMPLANT
TOWEL OR 17X24 6PK STRL BLUE (TOWEL DISPOSABLE) ×4 IMPLANT
VACURETTE 10 RIGID CVD (CANNULA) IMPLANT
VACURETTE 12 RIGID CVD (CANNULA) ×1 IMPLANT
VACURETTE 7MM CVD STRL WRAP (CANNULA) IMPLANT
VACURETTE 8 RIGID CVD (CANNULA) IMPLANT
VACURETTE 9 RIGID CVD (CANNULA) IMPLANT

## 2013-04-27 NOTE — H&P (Signed)
32 y.o. yo G3P1 with MAB at 10 weeks- no FHTs.  No bleeding or cramping.    Past Medical History  Diagnosis Date  . Anemia   . Depression   . Migraines   . UTI (urinary tract infection)   . Migraine   . Thyroid disease   . Varicose veins     Left > than right   Past Surgical History  Procedure Laterality Date  . Appendectomy    . Tonsillectomy and adenoidectomy    . Tonsillectomy  2001  . Tonsillectomy      History   Social History  . Marital Status: Single    Spouse Name: N/A    Number of Children: N/A  . Years of Education: N/A   Occupational History  . Not on file.   Social History Main Topics  . Smoking status: Never Smoker   . Smokeless tobacco: Never Used  . Alcohol Use: 0.6 oz/week    1 Glasses of wine per week  . Drug Use: No  . Sexually Active: Yes    Birth Control/ Protection: IUD   Other Topics Concern  . Not on file   Social History Narrative  . No narrative on file    No current facility-administered medications on file prior to encounter.   Current Outpatient Prescriptions on File Prior to Encounter  Medication Sig Dispense Refill  . propranolol ER (INDERAL LA) 60 MG 24 hr capsule Take 1 capsule (60 mg total) by mouth daily.  30 capsule  5  . SYNTHROID 150 MCG tablet Take 1 tablet (150 mcg total) by mouth daily.  30 tablet  11    Allergies  Allergen Reactions  . Aspirin Hives  . Bactrim Swelling  . Sulfa Antibiotics Swelling    Filed Vitals:   04/26/13 0821 04/27/13 1023  BP:  118/59  Pulse:  91  Temp:  98.8 F (37.1 C)  TempSrc:  Oral  Resp:  20  Height: 5\' 2"  (1.575 m)   Weight: 76.204 kg (168 lb)   SpO2:  100%     Lungs: clear to ascultation Cor:  RRR Abdomen:  soft, nontender, nondistended. Ex:  no cords, erythema Pelvic:  10 weeks size, cervix closed.  No bleeding.  A:  MAB at 10 weeks.   P:  For D&E. Pt is Rh neg and will need Rhogam.   All risks, benefits and alternatives d/w patient and she desires to proceed.   Had an episode of tingling in arms- none last two days.   Kesha Hurrell A

## 2013-04-27 NOTE — Anesthesia Postprocedure Evaluation (Signed)
  Anesthesia Post-op Note  Patient: Debbie [name]  Procedure(s) Performed: Procedure(s): DILATATION AND EVACUATION (N/A)  Patient Location: PACU  Anesthesia Type:General  Level of Consciousness: awake, alert  and oriented  Airway and Oxygen Therapy: Patient Spontanous Breathing  Post-op Pain: none  Post-op Assessment: Post-op Vital signs reviewed, Patient's Cardiovascular Status Stable, Respiratory Function Stable, Patent Airway, No signs of Nausea or vomiting and Pain level controlled  Post-op Vital Signs: Reviewed and stable  Complications: No apparent anesthesia complications

## 2013-04-27 NOTE — Brief Op Note (Signed)
04/27/2013  12:07 PM  PATIENT:  Debbie [name]  32 y.o. female  PRE-OPERATIVE DIAGNOSIS:  missed ab  POST-OPERATIVE DIAGNOSIS:  missed ab  PROCEDURE:  Procedure(s): DILATATION AND EVACUATION (N/A)  SURGEON:  Surgeon(s) and Role:    * Shadiyah Wernli A Shaheem Pichon, MD - Primary   ANESTHESIA:   general  EBL:  Total I/O In: 600 [I.V.:600] Out: -   SPECIMEN:  Source of Specimen:  uterine currettings  DISPOSITION OF SPECIMEN:  PATHOLOGY  COUNTS:  YES  TOURNIQUET:  * No tourniquets in log *  DICTATION: .Note written in EPIC  PLAN OF CARE: Discharge to home after PACU  PATIENT DISPOSITION:  PACU - hemodynamically stable.   Delay start of Pharmacological VTE agent (>24hrs) due to surgical blood loss or risk of bleeding: not applicable  Medications: Methergine  Complications: None  Findings:  10 week size uterus to 8 size post procedure.  Good crie was achieved.  After adequate anesthesia was achieved, the patient was prepped and draped in the usual sterile fashion.  The speculum was placed in the vagina and the cervix stabilized with a single-tooth tenaculum.  The cervix was dilated with Pratt dilators and the 12 mm curette was used to remove contents of the uterus.  Alternating sharp curettage with a curette and suction curettage was performed until all contents were removed and good crie was achieved.  All instruments were removed from the vagina.  The patient tolerated the procedure well.    Leea Rambeau A    

## 2013-04-27 NOTE — Anesthesia Preprocedure Evaluation (Signed)
Anesthesia Evaluation  Patient identified by MRN, date of birth, ID band Patient awake    Reviewed: Allergy & Precautions, H&P , NPO status , Patient's Chart, lab work & pertinent test results, reviewed documented beta blocker date and time   Airway Mallampati: III TM Distance: >3 FB Neck ROM: Full    Dental no notable dental hx. (+) Teeth Intact   Pulmonary neg pulmonary ROS,  breath sounds clear to auscultation  Pulmonary exam normal       Cardiovascular + Peripheral Vascular Disease negative cardio ROS  Rhythm:Regular Rate:Normal     Neuro/Psych  Headaches, PSYCHIATRIC DISORDERS Depression    GI/Hepatic negative GI ROS, Neg liver ROS,   Endo/Other  Hypothyroidism   Renal/GU negative Renal ROS  negative genitourinary   Musculoskeletal negative musculoskeletal ROS (+)   Abdominal Normal abdominal exam  (+)   Peds  Hematology negative hematology ROS (+)   Anesthesia Other Findings   Reproductive/Obstetrics (+) Pregnancy Missed Ab                           Anesthesia Physical Anesthesia Plan  ASA: II  Anesthesia Plan: General   Post-op Pain Management:    Induction: Intravenous  Airway Management Planned: LMA  Additional Equipment:   Intra-op Plan:   Post-operative Plan:   Informed Consent: I have reviewed the patients History and Physical, chart, labs and discussed the procedure including the risks, benefits and alternatives for the proposed anesthesia with the patient or authorized representative who has indicated his/her understanding and acceptance.   Dental advisory given  Plan Discussed with: Anesthesiologist, CRNA and Surgeon  Anesthesia Plan Comments:         Anesthesia Quick Evaluation

## 2013-04-27 NOTE — Transfer of Care (Signed)
Immediate Anesthesia Transfer of Care Note  Patient: Debbie [name]  Procedure(s) Performed: Procedure(s): DILATATION AND EVACUATION (N/A)  Patient Location: PACU  Anesthesia Type:General  Level of Consciousness: awake and oriented  Airway & Oxygen Therapy: Patient Spontanous Breathing and Patient connected to nasal cannula oxygen  Post-op Assessment: Report given to PACU RN and Post -op Vital signs reviewed and stable  Post vital signs: Reviewed and stable  Complications: No apparent anesthesia complications

## 2013-04-27 NOTE — Op Note (Signed)
04/27/2013  12:07 PM  PATIENT:  Debbie [name]  32 y.o. female  PRE-OPERATIVE DIAGNOSIS:  missed ab  POST-OPERATIVE DIAGNOSIS:  missed ab  PROCEDURE:  Procedure(s): DILATATION AND EVACUATION (N/A)  SURGEON:  Surgeon(s) and Role:    * Loney Laurence, MD - Primary   ANESTHESIA:   general  EBL:  Total I/O In: 600 [I.V.:600] Out: -   SPECIMEN:  Source of Specimen:  uterine currettings  DISPOSITION OF SPECIMEN:  PATHOLOGY  COUNTS:  YES  TOURNIQUET:  * No tourniquets in log *  DICTATION: .Note written in EPIC  PLAN OF CARE: Discharge to home after PACU  PATIENT DISPOSITION:  PACU - hemodynamically stable.   Delay start of Pharmacological VTE agent (>24hrs) due to surgical blood loss or risk of bleeding: not applicable  Medications: Methergine  Complications: None  Findings:  10 week size uterus to 8 size post procedure.  Good crie was achieved.  After adequate anesthesia was achieved, the patient was prepped and draped in the usual sterile fashion.  The speculum was placed in the vagina and the cervix stabilized with a single-tooth tenaculum.  The cervix was dilated with Shawnie Pons dilators and the 12 mm curette was used to remove contents of the uterus.  Alternating sharp curettage with a curette and suction curettage was performed until all contents were removed and good crie was achieved.  All instruments were removed from the vagina.  The patient tolerated the procedure well.    Omri Bertran A

## 2013-04-28 LAB — RH IG WORKUP (INCLUDES ABO/RH)
ABO/RH(D): A NEG
Gestational Age(Wks): 10

## 2013-05-07 ENCOUNTER — Encounter: Payer: Self-pay | Admitting: Vascular Surgery

## 2013-05-10 ENCOUNTER — Encounter: Payer: BC Managed Care – PPO | Admitting: Vascular Surgery

## 2013-07-02 ENCOUNTER — Ambulatory Visit (INDEPENDENT_AMBULATORY_CARE_PROVIDER_SITE_OTHER): Payer: BC Managed Care – PPO

## 2013-07-02 DIAGNOSIS — Z23 Encounter for immunization: Secondary | ICD-10-CM

## 2013-07-24 ENCOUNTER — Encounter (HOSPITAL_BASED_OUTPATIENT_CLINIC_OR_DEPARTMENT_OTHER): Payer: Self-pay | Admitting: Emergency Medicine

## 2013-07-24 ENCOUNTER — Emergency Department (HOSPITAL_BASED_OUTPATIENT_CLINIC_OR_DEPARTMENT_OTHER)
Admission: EM | Admit: 2013-07-24 | Discharge: 2013-07-24 | Disposition: A | Discharge status: Home / Self Care | Payer: BC Managed Care – PPO | Attending: Emergency Medicine | Admitting: Emergency Medicine

## 2013-07-24 DIAGNOSIS — O9989 Other specified diseases and conditions complicating pregnancy, childbirth and the puerperium: Secondary | ICD-10-CM | POA: Insufficient documentation

## 2013-07-24 DIAGNOSIS — E079 Disorder of thyroid, unspecified: Secondary | ICD-10-CM | POA: Insufficient documentation

## 2013-07-24 DIAGNOSIS — G43909 Migraine, unspecified, not intractable, without status migrainosus: Secondary | ICD-10-CM | POA: Insufficient documentation

## 2013-07-24 DIAGNOSIS — Z87440 Personal history of urinary (tract) infections: Secondary | ICD-10-CM | POA: Insufficient documentation

## 2013-07-24 DIAGNOSIS — R5381 Other malaise: Secondary | ICD-10-CM | POA: Insufficient documentation

## 2013-07-24 DIAGNOSIS — Z862 Personal history of diseases of the blood and blood-forming organs and certain disorders involving the immune mechanism: Secondary | ICD-10-CM | POA: Insufficient documentation

## 2013-07-24 DIAGNOSIS — Z79899 Other long term (current) drug therapy: Secondary | ICD-10-CM | POA: Insufficient documentation

## 2013-07-24 DIAGNOSIS — Z8659 Personal history of other mental and behavioral disorders: Secondary | ICD-10-CM | POA: Insufficient documentation

## 2013-07-24 DIAGNOSIS — R0789 Other chest pain: Secondary | ICD-10-CM

## 2013-07-24 DIAGNOSIS — R209 Unspecified disturbances of skin sensation: Secondary | ICD-10-CM | POA: Insufficient documentation

## 2013-07-24 MED ORDER — ACETAMINOPHEN 325 MG PO TABS
650.0000 mg | ORAL_TABLET | Freq: Once | ORAL | Status: AC
  Administered 2013-07-24: 650 mg via ORAL
  Filled 2013-07-24: qty 2

## 2013-07-24 MED ORDER — PROMETHAZINE HCL 25 MG PO TABS
12.5000 mg | ORAL_TABLET | Freq: Once | ORAL | Status: AC
  Administered 2013-07-24: 12.5 mg via ORAL
  Filled 2013-07-24: qty 1

## 2013-07-24 MED ORDER — PROMETHAZINE HCL 12.5 MG PO TABS: 12.5000 mg | ORAL_TABLET | Freq: Three times a day (TID) | ORAL | Status: DC | PRN

## 2013-07-24 MED ORDER — PROMETHAZINE HCL 25 MG PO TABS: 25.0000 mg | ORAL_TABLET | Freq: Once | ORAL | Status: DC

## 2013-07-24 NOTE — ED Provider Notes (Signed)
CSN: [id number]     Arrival date & time 07/24/13  4098 History   First MD Initiated Contact with Patient 07/24/13 0802     Chief Complaint  Patient presents with  . Headache   (Consider location/radiation/quality/duration/timing/severity/associated sxs/prior Treatment) Patient is a 32 y.o. female presenting with headaches.  Headache Associated symptoms: nausea   Associated symptoms: no abdominal pain and no vomiting    Patient is a 32 yo female with a history of migraine who presents with 4 days of headache. States started off intermittently for 2 days and then gradually became constant. Notes started on the left side and now is in entire frontal region. Described as throbbing. Endorses photophobia and phonophobia. States had one episode yesterday and one this morning of right arm tingling. Notes with exception of tingling, this is like her previous migraines just longer duration. Endorses nausea and overall feeling of weakness. Denies numbness or weakness in a particular extremity. Notes has taken ibuprofen for this without improvement. Notes she is 4 days late for her period.  Additionally the patient notes central chest pain that began last night while sitting at table. States it was a tight spasm like pain. She took motrin and it eased off. She states she can point to the exact location of the pain. Denies radiation, dyspnea, and diaphoresis. Denies exertional pain.  Past Medical History  Diagnosis Date  . Anemia   . Depression   . Migraines   . UTI (urinary tract infection)   . Migraine   . Thyroid disease   . Varicose veins     Left > than right   Past Surgical History  Procedure Laterality Date  . Appendectomy    . Tonsillectomy and adenoidectomy    . Tonsillectomy  2001  . Tonsillectomy    . Dilation and evacuation N/A 04/27/2013    Procedure: DILATATION AND EVACUATION;  Surgeon: Loney Laurence, MD;  Location: [facility];  Service: Gynecology;  Laterality: N/A;   Family  History  Problem Relation Age of Onset  . Arthritis Neg Hx     family  . Hypertension Mother   . Hyperlipidemia Father   . Diabetes Father   . Hypertension Father    History  Substance Use Topics  . Smoking status: Never Smoker   . Smokeless tobacco: Never Used  . Alcohol Use: 0.6 oz/week    1 Glasses of wine per week   OB History   Grav Para Term Preterm Abortions TAB SAB Ect Mult Living                 Review of Systems  Respiratory: Negative for chest tightness and shortness of breath.   Cardiovascular: Positive for chest pain (central reproducible).  Gastrointestinal: Positive for nausea. Negative for vomiting and abdominal pain.  Neurological: Positive for headaches. Negative for light-headedness.       Tingling in right arm Overall weakness    Allergies  Aspirin; Bactrim; and Sulfa antibiotics  Home Medications   Current Outpatient Rx  Name  Route  Sig  Dispense  Refill  . promethazine (PHENERGAN) 12.5 MG tablet   Oral   Take 1 tablet (12.5 mg total) by mouth every 8 (eight) hours as needed for nausea.   15 tablet   0   . SYNTHROID 150 MCG tablet   Oral   Take 1 tablet (150 mcg total) by mouth daily.   30 tablet   11     Dispense as written.  Brand name only    BP 109/59  Pulse 89  Temp(Src) 98.5 F (36.9 C) (Oral)  Resp 16  Ht 5\' 2"  (1.575 m)  Wt 160 lb (72.576 kg)  BMI 29.26 kg/m2  SpO2 100%  LMP 06/18/2013 Physical Exam  Constitutional: She appears well-developed and well-nourished.  HENT:  Head: Normocephalic and atraumatic.  Mouth/Throat: Oropharynx is clear and moist.  Eyes: EOM are normal. Pupils are equal, round, and reactive to light.  Cardiovascular: Normal rate, regular rhythm and normal heart sounds.   Pulmonary/Chest: Effort normal and breath sounds normal. No respiratory distress. She has no wheezes. She has no rales. She exhibits tenderness (reproducible over right costochondral joints).  Abdominal: Soft. Bowel sounds are  normal. She exhibits no distension. There is no tenderness.  Musculoskeletal: She exhibits no edema.  Neurological:  CN 2-12 intact, 5/5 strength in bilateral biceps, triceps, grip, quads, plantar and dorsiflexion, 4+/5 strength hip flexors, sensation to light touch intact throughout bilateral upper and lower extremities  Skin: Skin is warm and dry.    ED Course  Procedures (including critical care time) Labs Review Labs Reviewed  PREGNANCY, URINE - Abnormal; Notable for the following:    Preg Test, Ur POSITIVE (*)    All other components within normal limits   Imaging Review No results found.  EKG Interpretation   None       MDM   1. Migraine   2. Chest pain, musculoskeletal    Patient with history of migraine and onset of gradual headache with photophobia and phonophobia 4 days ago not responsive to ibuprofen. 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 Patient with symmetric neuro exam and no tingling at this time. Patient with positive urine pregnancy test so migraine treatment is limited to tylenol and phenergan at this time. Patient was given tylenol and phenergan for pain and nausea. Advised to use tylenol for headache and given prescription for phenergan for nausea at home. Advised to follow-up with her PCP for further management of migraines especially given potential hemiplegic migraines. Chest pain likely musculoskeletal in young female with no risk factors in addition to atypical nature of pain and tenderness to palpation of costochondral joints.. Discussed return precautions with patient.  This patient was discussed and seen with my attending Dr Radford Pax.  Marikay Alar, MD [facility] PGY-2 07/24/13 4:17 pm    Glori Luis, MD 07/24/13 [phone]

## 2013-07-24 NOTE — ED Notes (Signed)
MD at bedside. 

## 2013-07-24 NOTE — ED Notes (Signed)
Patient here with "migraine" headache x 4 days, reports that her entire head is tense. Reports some nausea with same. Has been taking ibuprofen with some relief. Also states that she is 4 days late for menstrual cycle.  Alert and oriented

## 2013-07-30 NOTE — ED Provider Notes (Signed)
I saw and evaluated the patient, reviewed the resident's note and I agree with the findings and plan.   .Face to face Exam:  General:  Awake HEENT:  Atraumatic Resp:  Normal effort Abd:  Nondistended Neuro:No focal weakness Lymph: No adenopathy   Nelia Shi, MD 07/30/13 1208

## 2013-08-08 ENCOUNTER — Other Ambulatory Visit: Payer: Self-pay | Admitting: Family Medicine

## 2013-09-16 ENCOUNTER — Inpatient Hospital Stay (HOSPITAL_COMMUNITY)
Admission: AD | Admit: 2013-09-16 | Discharge: 2013-09-16 | Disposition: A | Discharge status: Home / Self Care | Payer: Medicaid Other | Source: Ambulatory Visit | Attending: Obstetrics and Gynecology | Admitting: Obstetrics and Gynecology

## 2013-09-16 ENCOUNTER — Encounter (HOSPITAL_COMMUNITY): Payer: Self-pay | Admitting: *Deleted

## 2013-09-16 DIAGNOSIS — R109 Unspecified abdominal pain: Secondary | ICD-10-CM | POA: Insufficient documentation

## 2013-09-16 DIAGNOSIS — G43909 Migraine, unspecified, not intractable, without status migrainosus: Secondary | ICD-10-CM

## 2013-09-16 DIAGNOSIS — O99891 Other specified diseases and conditions complicating pregnancy: Secondary | ICD-10-CM | POA: Insufficient documentation

## 2013-09-16 HISTORY — DX: Hypothyroidism, unspecified: E03.9

## 2013-09-16 LAB — URINALYSIS, ROUTINE W REFLEX MICROSCOPIC
Bilirubin Urine: NEGATIVE
Glucose, UA: NEGATIVE mg/dL
Hgb urine dipstick: NEGATIVE
Protein, ur: NEGATIVE mg/dL
Specific Gravity, Urine: 1.025 (ref 1.005–1.030)

## 2013-09-16 MED ORDER — METOCLOPRAMIDE HCL 5 MG/ML IJ SOLN
10.0000 mg | Freq: Once | INTRAMUSCULAR | Status: AC
  Administered 2013-09-16: 10 mg via INTRAVENOUS
  Filled 2013-09-16: qty 2

## 2013-09-16 MED ORDER — DIPHENHYDRAMINE HCL 50 MG/ML IJ SOLN
25.0000 mg | Freq: Once | INTRAMUSCULAR | Status: AC
  Administered 2013-09-16: 25 mg via INTRAVENOUS
  Filled 2013-09-16: qty 1

## 2013-09-16 MED ORDER — PROMETHAZINE HCL 25 MG PO TABS: 25.0000 mg | ORAL_TABLET | Freq: Four times a day (QID) | ORAL | Status: DC | PRN

## 2013-09-16 MED ORDER — DEXAMETHASONE SODIUM PHOSPHATE 10 MG/ML IJ SOLN
10.0000 mg | Freq: Once | INTRAMUSCULAR | Status: AC
  Administered 2013-09-16: 10 mg via INTRAVENOUS
  Filled 2013-09-16: qty 1

## 2013-09-16 MED ORDER — LACTATED RINGERS IV BOLUS (SEPSIS)
1000.0000 mL | Freq: Once | INTRAVENOUS | Status: AC
  Administered 2013-09-16: 1000 mL via INTRAVENOUS

## 2013-09-16 NOTE — MAU Provider Note (Signed)
History     CSN: [id number]  Arrival date and time: 09/16/13 2130   First Provider Initiated Contact with Patient 09/16/13 1041      Chief Complaint  Patient presents with  . Migraine   HPI  Pt is [id number] @ [redacted]w[redacted]d pregnant who presents with migraine headache since yesterday morning.  Pt has been vomiting With the headache in addition to morning sickness.  Pt took Tylenol yesterday without relief.  Pt last ate last night which she vomiting this morning. Pt feels dizzy due to vomiting and headache.  Pt denies blurred vision.  Pt's headache is frontal.  Pt denies spotting or bleeding.   Pt denies constipation or diarrhea or UTI symptoms.  Pt is seeing Dr. Henderson Cloud for her PCP.  Past Medical History  Diagnosis Date  . Anemia   . Depression   . Migraines   . UTI (urinary tract infection)   . Migraine   . Thyroid disease   . Varicose veins     Left > than right  . Hypothyroidism     Past Surgical History  Procedure Laterality Date  . Appendectomy    . Tonsillectomy and adenoidectomy    . Tonsillectomy  2001  . Tonsillectomy    . Dilation and evacuation N/A 04/27/2013    Procedure: DILATATION AND EVACUATION;  Surgeon: Loney Laurence, MD;  Location: [facility];  Service: Gynecology;  Laterality: N/A;    Family History  Problem Relation Age of Onset  . Arthritis Neg Hx     family  . Hypertension Mother   . Hyperlipidemia Father   . Diabetes Father   . Hypertension Father     History  Substance Use Topics  . Smoking status: Never Smoker   . Smokeless tobacco: Never Used  . Alcohol Use: No    Allergies:  Allergies  Allergen Reactions  . Aspirin Hives  . Bactrim Swelling  . Sulfa Antibiotics Swelling    Prescriptions prior to admission  Medication Sig Dispense Refill  . promethazine (PHENERGAN) 12.5 MG tablet Take 1 tablet (12.5 mg total) by mouth every 8 (eight) hours as needed for nausea.  15 tablet  0  . SYNTHROID 150 MCG tablet TAKE 1 TABLET (150 MCG  TOTAL) BY MOUTH DAILY.  30 tablet  5    Review of Systems  Constitutional: Negative for fever and chills.  Eyes: Negative for blurred vision.  Gastrointestinal: Positive for nausea and vomiting. Negative for abdominal pain and diarrhea.  Neurological: Positive for headaches.   Physical Exam   Blood pressure 114/59, pulse 73, temperature 98.4 F (36.9 C), temperature source Oral, resp. rate 18, height 5\' 2"  (1.575 m), weight 72.576 kg (160 lb), last menstrual period 06/18/2013.  Physical Exam  Nursing note and vitals reviewed. Constitutional: She is oriented to person, place, and time. She appears well-developed and well-nourished. No distress.  HENT:  Head: Normocephalic.  Eyes: Pupils are equal, round, and reactive to light.  Neck: Normal range of motion.  Cardiovascular: Normal rate.   Respiratory: Effort normal.  GI: Soft.  Musculoskeletal: Normal range of motion.  Neurological: She is alert and oriented to person, place, and time.  Skin: Skin is warm and dry.  Psychiatric: She has a normal mood and affect.    MAU Course  Procedures Results for orders placed during the hospital encounter of 09/16/13 (from the past 24 hour(s))  URINALYSIS, ROUTINE W REFLEX MICROSCOPIC     Status: None   Collection Time    09/16/13  10:19 AM      Result Value Range   Color, Urine YELLOW  YELLOW   APPearance CLEAR  CLEAR   Specific Gravity, Urine 1.025  1.005 - 1.030   pH 6.5  5.0 - 8.0   Glucose, UA NEGATIVE  NEGATIVE mg/dL   Hgb urine dipstick NEGATIVE  NEGATIVE   Bilirubin Urine NEGATIVE  NEGATIVE   Ketones, ur NEGATIVE  NEGATIVE mg/dL   Protein, ur NEGATIVE  NEGATIVE mg/dL   Urobilinogen, UA 0.2  0.0 - 1.0 mg/dL   Nitrite NEGATIVE  NEGATIVE   Leukocytes, UA NEGATIVE  NEGATIVE   Discussed with Dr. Dareen Piano- will give headache protocol Pt's headache resolved after binadryl, reglan and decadron Assessment and Plan  Headache in pregnancy F/u with OB  Rufina Kimery 09/16/2013,  10:42 AM

## 2013-09-16 NOTE — MAU Note (Signed)
Pt reports she has had a migraine headache since yesterday. Has had N/V from the pain Took tylenol with no relief. Starting to c/o mild abd cramping as well.

## 2013-09-21 LAB — OB RESULTS CONSOLE RUBELLA ANTIBODY, IGM: Rubella: IMMUNE

## 2013-09-21 LAB — OB RESULTS CONSOLE HIV ANTIBODY (ROUTINE TESTING): HIV: NONREACTIVE

## 2013-09-21 LAB — OB RESULTS CONSOLE HEPATITIS B SURFACE ANTIGEN: Hepatitis B Surface Ag: NEGATIVE

## 2013-09-30 NOTE — L&D Delivery Note (Signed)
Operative Delivery Note At 2:49 PM a viable and healthy female was delivered via Vaginal, Vacuum Neurosurgeon).  Presentation: vertex; Position: Left,, Occiput,, Anterior; Station: +2.  Verbal consent: obtained from patient.  Risks and benefits discussed in detail.  Risks include, but are not limited to the risks of anesthesia, bleeding, infection, damage to maternal tissues, fetal cephalhematoma.  There is also the risk of inability to effect vaginal delivery of the head, or shoulder dystocia that cannot be resolved by established maneuvers, leading to the need for emergency cesarean section.  Pt was noted to have deep late decelerations after contractions. Pt pushed twice with minimal descent of the head and worsening bradycardia. The patient was advised of the need to assist with the delivery of the infant with the application of a vacuum.  The vacuum was applied and the infant was delivered to crowning with 2 pushes. The vacuum was disengaged and the infants body was delivered. The infant appeared stunned and limp. The cord was clamped and cut and the infant was passed to the waiting RN for resuscitation. A code APGAR was called. The infant responded well and was later passed to the mother for skin to skin  APGAR: 3, 8; weight pending.   Placenta status: Intact, Spontaneous.   Cord: 3 vessels with the following complications: None.  Cord pH: 7.1  Anesthesia: Epidural  Instruments: kiwi vacuum Episiotomy: none Lacerations: none Suture Repair: NA Est. Blood Loss (mL): 250 cc  Mom to postpartum.  Baby to Couplet care / Skin to Skin.  ROSS,KENDRA H. 03/23/2014, 3:07 PM

## 2013-11-16 ENCOUNTER — Emergency Department (HOSPITAL_BASED_OUTPATIENT_CLINIC_OR_DEPARTMENT_OTHER)
Admission: EM | Admit: 2013-11-16 | Discharge: 2013-11-16 | Disposition: A | Discharge status: Home / Self Care | Payer: Medicaid Other | Attending: Emergency Medicine | Admitting: Emergency Medicine

## 2013-11-16 ENCOUNTER — Encounter (HOSPITAL_BASED_OUTPATIENT_CLINIC_OR_DEPARTMENT_OTHER): Payer: Self-pay | Admitting: Emergency Medicine

## 2013-11-16 DIAGNOSIS — Z79899 Other long term (current) drug therapy: Secondary | ICD-10-CM | POA: Insufficient documentation

## 2013-11-16 DIAGNOSIS — B349 Viral infection, unspecified: Secondary | ICD-10-CM

## 2013-11-16 DIAGNOSIS — O98819 Other maternal infectious and parasitic diseases complicating pregnancy, unspecified trimester: Secondary | ICD-10-CM | POA: Insufficient documentation

## 2013-11-16 DIAGNOSIS — Z8679 Personal history of other diseases of the circulatory system: Secondary | ICD-10-CM | POA: Insufficient documentation

## 2013-11-16 DIAGNOSIS — Z87440 Personal history of urinary (tract) infections: Secondary | ICD-10-CM | POA: Insufficient documentation

## 2013-11-16 DIAGNOSIS — O9989 Other specified diseases and conditions complicating pregnancy, childbirth and the puerperium: Secondary | ICD-10-CM | POA: Insufficient documentation

## 2013-11-16 DIAGNOSIS — H9209 Otalgia, unspecified ear: Secondary | ICD-10-CM | POA: Insufficient documentation

## 2013-11-16 DIAGNOSIS — Z8639 Personal history of other endocrine, nutritional and metabolic disease: Secondary | ICD-10-CM | POA: Insufficient documentation

## 2013-11-16 DIAGNOSIS — Z862 Personal history of diseases of the blood and blood-forming organs and certain disorders involving the immune mechanism: Secondary | ICD-10-CM | POA: Insufficient documentation

## 2013-11-16 DIAGNOSIS — B9789 Other viral agents as the cause of diseases classified elsewhere: Secondary | ICD-10-CM | POA: Insufficient documentation

## 2013-11-16 DIAGNOSIS — J069 Acute upper respiratory infection, unspecified: Secondary | ICD-10-CM

## 2013-11-16 DIAGNOSIS — Z8659 Personal history of other mental and behavioral disorders: Secondary | ICD-10-CM | POA: Insufficient documentation

## 2013-11-16 NOTE — ED Notes (Signed)
Cough and congestion right ear pain onset sunday

## 2013-11-16 NOTE — ED Provider Notes (Signed)
CSN: [id number]     Arrival date & time 11/16/13  9211 History   First MD Initiated Contact with Patient 11/16/13 [phone]     Chief Complaint  Patient presents with  . cough,congestion, right ear ache      (Consider location/radiation/quality/duration/timing/severity/associated sxs/prior Treatment) The history is provided by the patient.   33 year old female. Onset of coughing congestion on Sunday. Started with right ear pain this morning. Symptoms associated with chills no fever productive cough yellow phlegm. Patient is 5 months pregnant [redacted] weeks due date is June 29. Patient's continued to feel fetal movement. Denies any pelvic pain or vaginal bleeding.  Past Medical History  Diagnosis Date  . Anemia   . Depression   . Migraines   . UTI (urinary tract infection)   . Migraine   . Thyroid disease   . Varicose veins     Left > than right  . Hypothyroidism    Past Surgical History  Procedure Laterality Date  . Appendectomy    . Tonsillectomy and adenoidectomy    . Tonsillectomy  2001  . Tonsillectomy    . Dilation and evacuation N/A 04/27/2013    Procedure: DILATATION AND EVACUATION;  Surgeon: Daria Pastures, MD;  Location: [facility];  Service: Gynecology;  Laterality: N/A;   Family History  Problem Relation Age of Onset  . Arthritis Neg Hx     family  . Hypertension Mother   . Hyperlipidemia Father   . Diabetes Father   . Hypertension Father    History  Substance Use Topics  . Smoking status: Never Smoker   . Smokeless tobacco: Never Used  . Alcohol Use: No   OB History   Grav Para Term Preterm Abortions TAB SAB Ect Mult Living   4 2 2  1  1   2      Review of Systems  Constitutional: Positive for chills. Negative for fever.  HENT: Positive for congestion, ear pain and sore throat. Negative for trouble swallowing.   Respiratory: Positive for cough. Negative for shortness of breath.   Cardiovascular: Negative for chest pain.  Gastrointestinal: Negative for  abdominal pain.  Genitourinary: Negative for dysuria.  Musculoskeletal: Positive for myalgias. Negative for neck pain and neck stiffness.  Skin: Negative for rash.  Neurological: Negative for syncope.  Hematological: Does not bruise/bleed easily.  Psychiatric/Behavioral: Negative for confusion.      Allergies  Aspirin; Bactrim; and Sulfa antibiotics  Home Medications   Current Outpatient Rx  Name  Route  Sig  Dispense  Refill  . acetaminophen (TYLENOL) 500 MG tablet   Oral   Take 1,000 mg by mouth every 6 (six) hours as needed for mild pain or moderate pain.         . Famotidine (PEPCID PO)   Oral   Take 1 tablet by mouth daily as needed (indigestion).         . promethazine (PHENERGAN) 25 MG tablet   Oral   Take 1 tablet (25 mg total) by mouth every 6 (six) hours as needed for nausea or vomiting.   30 tablet   0   . SYNTHROID 150 MCG tablet      TAKE 1 TABLET (150 MCG TOTAL) BY MOUTH DAILY.   30 tablet   5     Dispense as written.    BP 116/64  Pulse 84  Temp(Src) 98.6 F (37 C) (Oral)  Resp 16  SpO2 100%  LMP 06/18/2013 Physical Exam  Nursing note and vitals  reviewed. Constitutional: She is oriented to person, place, and time. She appears well-developed and well-nourished. No distress.  HENT:  Head: Normocephalic and atraumatic.  Right Ear: External ear normal.  Left Ear: External ear normal.  Mouth/Throat: Oropharynx is clear and moist. No oropharyngeal exudate.  Eyes: Conjunctivae and EOM are normal. Pupils are equal, round, and reactive to light.  Neck: Normal range of motion.  Cardiovascular: Normal rate, regular rhythm and normal heart sounds.   No murmur heard. Pulmonary/Chest: Effort normal and breath sounds normal. No respiratory distress.  Abdominal: Soft. Bowel sounds are normal. There is no tenderness.  Musculoskeletal: Normal range of motion. She exhibits no edema.  Neurological: She is alert and oriented to person, place, and time. No  cranial nerve deficit. She exhibits normal muscle tone. Coordination normal.  Skin: Skin is warm. No rash noted.    ED Course  Procedures (including critical care time) Labs Review Labs Reviewed - No data to display Imaging Review No results found.  EKG Interpretation   None       MDM   Final diagnoses:  URI (upper respiratory infection)  Viral illness    Agent is [redacted] weeks pregnant. Onset of cough and congestion right ear pain for a few days ago. The right ear pain started this morning. Patient denies any pelvic pain or vaginal bleeding. No complicating factors of the pregnancy so far. Patient's continued to feel fetal movement. Fetal heart tones were 160 here today. No fever clinically not suggestive of pneumonia so chest x-ray not done. Patient RE taking Mucinex we'll switch to Mucinex DM. Patient will followup with her OB/GYN. Return here for any new or worse symptoms. If fever develops a more concerning for pneumonia. Suspect right now that the main complaint is a viral upper respiratory infection. Tympanic membranes are normal bilaterally no evidence of a right ear infection. Patient is nontoxic no acute distress.    Mervin Kung, MD 11/16/13 1040

## 2013-11-16 NOTE — Discharge Instructions (Signed)
Return for development of fevers. Any abdominal pain or vaginal bleeding. Recommend switching to Mucinex DM. As we discussed held off on chest x-ray today since symptoms not suggestive of pneumonia however fever develops of them will want to verify whether this pneumonia or not or perhaps start antibiotics. Followup with your OB/GYN in the next few days. Recommend rest plenty of fluids. Tylenol or Motrin as needed.

## 2013-12-14 ENCOUNTER — Encounter (HOSPITAL_BASED_OUTPATIENT_CLINIC_OR_DEPARTMENT_OTHER): Payer: Self-pay | Admitting: Emergency Medicine

## 2013-12-14 ENCOUNTER — Emergency Department (HOSPITAL_BASED_OUTPATIENT_CLINIC_OR_DEPARTMENT_OTHER)
Admission: EM | Admit: 2013-12-14 | Discharge: 2013-12-14 | Disposition: A | Discharge status: Home / Self Care | Payer: Medicaid Other | Attending: Emergency Medicine | Admitting: Emergency Medicine

## 2013-12-14 DIAGNOSIS — E039 Hypothyroidism, unspecified: Secondary | ICD-10-CM | POA: Insufficient documentation

## 2013-12-14 DIAGNOSIS — W260XXA Contact with knife, initial encounter: Secondary | ICD-10-CM | POA: Insufficient documentation

## 2013-12-14 DIAGNOSIS — W261XXA Contact with sword or dagger, initial encounter: Secondary | ICD-10-CM

## 2013-12-14 DIAGNOSIS — S61209A Unspecified open wound of unspecified finger without damage to nail, initial encounter: Secondary | ICD-10-CM | POA: Insufficient documentation

## 2013-12-14 DIAGNOSIS — Z87440 Personal history of urinary (tract) infections: Secondary | ICD-10-CM | POA: Insufficient documentation

## 2013-12-14 DIAGNOSIS — E079 Disorder of thyroid, unspecified: Secondary | ICD-10-CM | POA: Insufficient documentation

## 2013-12-14 DIAGNOSIS — O9989 Other specified diseases and conditions complicating pregnancy, childbirth and the puerperium: Secondary | ICD-10-CM | POA: Insufficient documentation

## 2013-12-14 DIAGNOSIS — Y9389 Activity, other specified: Secondary | ICD-10-CM | POA: Insufficient documentation

## 2013-12-14 DIAGNOSIS — Z8659 Personal history of other mental and behavioral disorders: Secondary | ICD-10-CM | POA: Insufficient documentation

## 2013-12-14 DIAGNOSIS — Z862 Personal history of diseases of the blood and blood-forming organs and certain disorders involving the immune mechanism: Secondary | ICD-10-CM | POA: Insufficient documentation

## 2013-12-14 DIAGNOSIS — S61219A Laceration without foreign body of unspecified finger without damage to nail, initial encounter: Secondary | ICD-10-CM

## 2013-12-14 DIAGNOSIS — Y929 Unspecified place or not applicable: Secondary | ICD-10-CM | POA: Insufficient documentation

## 2013-12-14 DIAGNOSIS — O99280 Endocrine, nutritional and metabolic diseases complicating pregnancy, unspecified trimester: Secondary | ICD-10-CM

## 2013-12-14 NOTE — ED Provider Notes (Signed)
CSN: [id number]     Arrival date & time 12/14/13  1156 History   First MD Initiated Contact with Patient 12/14/13 1253     Chief Complaint  Patient presents with  . Laceration   HPI  Debbie [name] is 33 y.o. female that presented to the ED today after cutting her left middle finger with knife. This event occurred around 11:30 this afternoon by accident. Patient wrapped finger and dipped in "sugar". Patient is uncertain of her last tetanus shot, she believes it was about 3-4 years ago. She denies numbness, tingling or decrease range of motion in her finger.    PCP: Dr. Sheldon Silvan.   Past Medical History  Diagnosis Date  . Anemia   . Depression   . Migraines   . UTI (urinary tract infection)   . Migraine   . Thyroid disease   . Varicose veins     Left > than right  . Hypothyroidism    Past Surgical History  Procedure Laterality Date  . Appendectomy    . Tonsillectomy and adenoidectomy    . Tonsillectomy  2001  . Tonsillectomy    . Dilation and evacuation N/A 04/27/2013    Procedure: DILATATION AND EVACUATION;  Surgeon: Daria Pastures, MD;  Location: [facility];  Service: Gynecology;  Laterality: N/A;   Family History  Problem Relation Age of Onset  . Arthritis Neg Hx     family  . Hypertension Mother   . Hyperlipidemia Father   . Diabetes Father   . Hypertension Father    History  Substance Use Topics  . Smoking status: Never Smoker   . Smokeless tobacco: Never Used  . Alcohol Use: No   OB History   Grav Para Term Preterm Abortions TAB SAB Ect Mult Living   4 2 2  1  1   2      Review of Systems  Neurological: Negative for weakness and numbness.  All other systems reviewed and are negative.   Allergies  Aspirin; Bactrim; and Sulfa antibiotics  Home Medications   Current Outpatient Rx  Name  Route  Sig  Dispense  Refill  . acetaminophen (TYLENOL) 500 MG tablet   Oral   Take 1,000 mg by mouth every 6 (six) hours as needed for mild pain or moderate pain.         . Famotidine (PEPCID PO)   Oral   Take 1 tablet by mouth daily as needed (indigestion).         . promethazine (PHENERGAN) 25 MG tablet   Oral   Take 1 tablet (25 mg total) by mouth every 6 (six) hours as needed for nausea or vomiting.   30 tablet   0   . SYNTHROID 150 MCG tablet      TAKE 1 TABLET (150 MCG TOTAL) BY MOUTH DAILY.   30 tablet   5     Dispense as written.    BP 106/65  Pulse 73  Temp(Src) 98 F (36.7 C) (Oral)  Resp 16  Ht 5\' 2"  (1.575 m)  Wt 172 lb (78.019 kg)  BMI 31.45 kg/m2  SpO2 100%  LMP 06/18/2013 Physical Exam Gen: NAD. Pleasant female. G3P2 @ [redacted] weeks gestation. HEENT: AT. [place].  Bilateral eyes without injections or icterus. MMM. CV: RRR. Chest: CTAB, no wheeze or crackles Abd: Soft. NTND. BS present. No Masses palpated.  Ext: left middle finger dorsal aspect with 52mm laceration.  Skin: Norashes, purpura or petechiae.  Neuro: Normal gait. PERLA. EOMi.  Alert. Grossly intact.   ED Course  LACERATION REPAIR Date/Time: 12/14/2013 1:40 PM Performed by: Raoul Pitch, RENEE A Authorized by: Howard Pouch A Consent: Verbal consent obtained. Risks and benefits: risks, benefits and alternatives were discussed Consent given by: patient Patient understanding: patient states understanding of the procedure being performed Patient consent: the patient's understanding of the procedure matches consent given Procedure consent: procedure consent matches procedure scheduled Relevant documents: relevant documents present and verified Test results: test results not available Site marked: the operative site was not marked Imaging studies: imaging studies not available Required items: required blood products, implants, devices, and special equipment available Patient identity confirmed: verbally with patient and arm band Body area: upper extremity Location details: right long finger Laceration length: 1 cm Contamination: The wound is contaminated. Foreign  bodies: no foreign bodies Tendon involvement: none Nerve involvement: none Vascular damage: no Anesthetic total: 0 ml Patient sedated: no Preparation: Patient was prepped and draped in the usual sterile fashion. Irrigation solution: saline Irrigation method: syringe Amount of cleaning: standard Debridement: none Degree of undermining: none Skin closure: Steri-Strips Approximation: close Approximation difficulty: simple Dressing: 4x4 sterile gauze, antibiotic ointment and splint Patient tolerance: Patient tolerated the procedure well with no immediate complications.   (including critical care time) Labs Review Labs Reviewed - No data to display Imaging Review No results found.   EKG Interpretation None      MDM   Final diagnoses:  None   Patient is a G3 P2 at [redacted] weeks gestation, presented to the ED after accidentally lacerating the middle finger of her left hand. She remained hemodynamically stable with no bleeding on admission. Patient's last tetanus shot was in 2012. Hand was soaked in saline and Betadine solution mixture. The patient was prepped and draped in normal sterile fashion. Was irrigated with saline. 3 1/8 inch Steri-Strips were placed overrode approximating the edges. Finger splint was applied to prevent patient from flexing finger until healed. No vascular, nerve or tendon was involved. Wound had good hemostasis. Patient tolerated procedure well. She was encouraged to be finger splint on for 5 days to allow good healing process to occur. Patient was encouraged to let Steri-Strips fall off on their own. Patient was encouraged not to place and instead of water (bath water, dishwater). Patient was encouraged to followup with PCP within a week.    Ma Hillock, DO 12/14/13 1346

## 2013-12-14 NOTE — ED Provider Notes (Signed)
33 y.o. Female g3p2 present after accidental cut to left middle finger with cooking knife.  1 cm laceration with no gap although through to sq tissue,  Full arom, no neuro deficit.   I performed a history and physical examination of Debbie [name] and discussed her management with Dr. Raoul Pitch.  I agree with the history, physical, assessment, and plan of care, with the following exceptions: None  I was present for the following procedures: None Time Spent in Critical Care of the patient: None Time spent in discussions with the patient and family: 5  Debbie [name] Shelda Jakes, MD 12/14/13 1550

## 2013-12-14 NOTE — ED Notes (Signed)
Was cutting meat with a knife and slipped cutting her left 3rd digit. Bleeding controlled. She put sugar on it and came to the ED.

## 2013-12-14 NOTE — Discharge Instructions (Signed)
Wound Care Wound care helps prevent pain and infection.  You may need a tetanus shot if:  You cannot remember when you had your last tetanus shot.  You have never had a tetanus shot.  The injury broke your skin. If you need a tetanus shot and you choose not to have one, you may get tetanus. Sickness from tetanus can be serious. HOME CARE   Only take medicine as told by your doctor.  Clean the wound daily with mild soap and water.  Change any bandages (dressings) as told by your doctor.  Put medicated cream and a bandage on the wound as told by your doctor.  Change the bandage if it gets wet, dirty, or starts to smell.  Take showers. Do not take baths, swim, or do anything that puts your wound under water.  Rest and raise (elevate) the wound until the pain and puffiness (swelling) are better.  Keep all doctor visits as told. GET HELP RIGHT AWAY IF:   Yellowish-white fluid (pus) comes from the wound.  Medicine does not lessen your pain.  There is a red streak going away from the wound.  You have a fever. MAKE SURE YOU:   Understand these instructions.  Will watch your condition.  Will get help right away if you are not doing well or get worse. Document Released: 06/25/2008 Document Revised: 12/09/2011 Document Reviewed: 01/20/2011 [employer] Patient Information 2014 [employer].  

## 2013-12-24 ENCOUNTER — Inpatient Hospital Stay (HOSPITAL_COMMUNITY)
Admission: AD | Admit: 2013-12-24 | Discharge: 2013-12-24 | Disposition: A | Discharge status: Home / Self Care | Payer: Medicaid Other | Source: Ambulatory Visit | Attending: Obstetrics & Gynecology | Admitting: Obstetrics & Gynecology

## 2013-12-24 ENCOUNTER — Encounter (HOSPITAL_COMMUNITY): Payer: Self-pay | Admitting: *Deleted

## 2013-12-24 DIAGNOSIS — N898 Other specified noninflammatory disorders of vagina: Secondary | ICD-10-CM | POA: Insufficient documentation

## 2013-12-24 DIAGNOSIS — O479 False labor, unspecified: Secondary | ICD-10-CM

## 2013-12-24 DIAGNOSIS — E079 Disorder of thyroid, unspecified: Secondary | ICD-10-CM | POA: Insufficient documentation

## 2013-12-24 DIAGNOSIS — E039 Hypothyroidism, unspecified: Secondary | ICD-10-CM | POA: Insufficient documentation

## 2013-12-24 DIAGNOSIS — O9989 Other specified diseases and conditions complicating pregnancy, childbirth and the puerperium: Principal | ICD-10-CM

## 2013-12-24 DIAGNOSIS — O99280 Endocrine, nutritional and metabolic diseases complicating pregnancy, unspecified trimester: Secondary | ICD-10-CM

## 2013-12-24 DIAGNOSIS — O99891 Other specified diseases and conditions complicating pregnancy: Secondary | ICD-10-CM | POA: Insufficient documentation

## 2013-12-24 DIAGNOSIS — O4700 False labor before 37 completed weeks of gestation, unspecified trimester: Secondary | ICD-10-CM | POA: Insufficient documentation

## 2013-12-24 LAB — WET PREP, GENITAL
Clue Cells Wet Prep HPF POC: NONE SEEN
Trich, Wet Prep: NONE SEEN
YEAST WET PREP: NONE SEEN

## 2013-12-24 LAB — URINALYSIS, ROUTINE W REFLEX MICROSCOPIC
BILIRUBIN URINE: NEGATIVE
Glucose, UA: NEGATIVE mg/dL
Hgb urine dipstick: NEGATIVE
KETONES UR: NEGATIVE mg/dL
Leukocytes, UA: NEGATIVE
NITRITE: NEGATIVE
PROTEIN: NEGATIVE mg/dL
SPECIFIC GRAVITY, URINE: 1.02 (ref 1.005–1.030)
UROBILINOGEN UA: 0.2 mg/dL (ref 0.0–1.0)
pH: 6 (ref 5.0–8.0)

## 2013-12-24 LAB — AMNISURE RUPTURE OF MEMBRANE (ROM) NOT AT ARMC: Amnisure ROM: NEGATIVE

## 2013-12-24 NOTE — MAU Note (Cosign Needed)
Pt presents with complaints of clear vaginal leakage of fluid that she noticed this morning after taking a bath and then she noticed it again while she was at [place] this evening.

## 2013-12-24 NOTE — MAU Provider Note (Signed)
Chief Complaint:  possible rupture of membranes     HPI: Debbie [name] is a 33 y.o. [id number] at [redacted]w[redacted]d who presents to maternity admissions reporting 2 incidences today of leaking a small amount of vaginal fluid. Both time she had an empty bladder. She does not think the fluid was urine. Needed to change her underwear but has not been wearing a pad. Denies dysuria, urgency, frequency. Denies irritative vaginal discharge. Denies antecedent intercourse. Denies contractions except occasional Braxton-Hicks. No vaginal bleeding. Good fetal movement.   Pregnancy Course: PNC at [facility], primary Dr. Philis Pique. Complicated by hypothyroidism, RLP.   Past Medical History: Past Medical History  Diagnosis Date  . Anemia   . Depression   . Migraines   . UTI (urinary tract infection)   . Migraine   . Thyroid disease   . Varicose veins     Left > than right  . Hypothyroidism     Past obstetric history: OB History  Gravida Para Term Preterm AB SAB TAB Ectopic Multiple Living  4 2 2  1 1    2     # Outcome Date GA Lbr Len/2nd Weight Sex Delivery Anes PTL Lv  4 CUR           3 SAB           2 TRM           1 TRM               Past Surgical History: Past Surgical History  Procedure Laterality Date  . Appendectomy    . Tonsillectomy and adenoidectomy    . Tonsillectomy  2001  . Tonsillectomy    . Dilation and evacuation N/A 04/27/2013    Procedure: DILATATION AND EVACUATION;  Surgeon: Daria Pastures, MD;  Location: [facility];  Service: Gynecology;  Laterality: N/A;     Family History: Family History  Problem Relation Age of Onset  . Arthritis Neg Hx     family  . Hypertension Mother   . Hyperlipidemia Father   . Diabetes Father   . Hypertension Father     Social History: History  Substance Use Topics  . Smoking status: Never Smoker   . Smokeless tobacco: Never Used  . Alcohol Use: No    Allergies:  Allergies  Allergen Reactions  . Aspirin Hives  . Bactrim Swelling  . Sulfa  Antibiotics Swelling    Meds:  Prescriptions prior to admission  Medication Sig Dispense Refill  . acetaminophen (TYLENOL) 500 MG tablet Take 1,000 mg by mouth every 6 (six) hours as needed for mild pain or moderate pain.      . Famotidine (PEPCID PO) Take 1 tablet by mouth daily as needed (indigestion).      . promethazine (PHENERGAN) 25 MG tablet Take 1 tablet (25 mg total) by mouth every 6 (six) hours as needed for nausea or vomiting.  30 tablet  0  . SYNTHROID 150 MCG tablet TAKE 1 TABLET (150 MCG TOTAL) BY MOUTH DAILY.  30 tablet  5    ROS: Pertinent findings in history of present illness.  Physical Exam  Blood pressure 113/62, pulse 82, temperature 98.4 F (36.9 C), temperature source Oral, resp. rate 18, height 5\' 2"  (1.575 m), weight 80.287 kg (177 lb), last menstrual period 06/18/2013. GENERAL: Well-developed, well-nourished female in no acute distress.  HEENT: normocephalic HEART: normal rate RESP: normal effort ABDOMEN: Soft, non-tender, gravid appropriate for gestational age EXTREMITIES: Nontender, no edema NEURO: alert and oriented SPECULUM  EXAM: NEFG, scant mucusy discharge, no blood, cervix appears thick, closed. Neg pool, neg fern. SVE deferred  FHT:  Baseline 130-135 , moderate variability, accelerations present, no decelerations Contractions: UI and runs of q3-35min mild UCs   Labs: Results for orders placed during the hospital encounter of 12/24/13 (from the past 24 hour(s))  WET PREP, GENITAL     Status: Abnormal   Collection Time    12/24/13  7:05 PM      Result Value Ref Range   Yeast Wet Prep HPF POC NONE SEEN  NONE SEEN   Trich, Wet Prep NONE SEEN  NONE SEEN   Clue Cells Wet Prep HPF POC NONE SEEN  NONE SEEN   WBC, Wet Prep HPF POC FEW (*) NONE SEEN  AMNISURE RUPTURE OF MEMBRANE (ROM)     Status: None   Collection Time    12/24/13  7:20 PM      Result Value Ref Range   Amnisure ROM NEGATIVE    URINALYSIS, ROUTINE W REFLEX MICROSCOPIC     Status:  None   Collection Time    12/24/13  7:55 PM      Result Value Ref Range   Color, Urine YELLOW  YELLOW   APPearance CLEAR  CLEAR   Specific Gravity, Urine 1.020  1.005 - 1.030   pH 6.0  5.0 - 8.0   Glucose, UA NEGATIVE  NEGATIVE mg/dL   Hgb urine dipstick NEGATIVE  NEGATIVE   Bilirubin Urine NEGATIVE  NEGATIVE   Ketones, ur NEGATIVE  NEGATIVE mg/dL   Protein, ur NEGATIVE  NEGATIVE mg/dL   Urobilinogen, UA 0.2  0.0 - 1.0 mg/dL   Nitrite NEGATIVE  NEGATIVE   Leukocytes, UA NEGATIVE  NEGATIVE    Imaging:  No results found. MAU Course: Care assumed by Noni Saupe NP at 2005. Discussed with Dr. Laray Anger, CNM 12/24/2013 8:04 PM  2000: Awaiting urine results.  2015: Pt says she has to leave due to a problem with her children. Pt requested NP/RN to call with urine results if there was a problem.    Assessment:  Vaginal discharge in third trimester; negative for ROM  Plan:  Discharge home in stable condition Return to MAU as needed, if symptoms worsen Follow up in the office as scheduled   [place], NP 12/24/2013 8:19 PM

## 2013-12-25 NOTE — MAU Provider Note (Signed)
I discussed case with NP and agree with above

## 2014-01-03 LAB — OB RESULTS CONSOLE RPR: RPR: NONREACTIVE

## 2014-01-17 ENCOUNTER — Encounter (HOSPITAL_COMMUNITY): Payer: Self-pay | Admitting: *Deleted

## 2014-01-17 ENCOUNTER — Inpatient Hospital Stay (HOSPITAL_COMMUNITY)
Admission: AD | Admit: 2014-01-17 | Discharge: 2014-01-17 | Disposition: A | Discharge status: Home / Self Care | Payer: Medicaid Other | Source: Ambulatory Visit | Attending: Obstetrics and Gynecology | Admitting: Obstetrics and Gynecology

## 2014-01-17 DIAGNOSIS — I83819 Varicose veins of unspecified lower extremities with pain: Secondary | ICD-10-CM

## 2014-01-17 DIAGNOSIS — M7989 Other specified soft tissue disorders: Secondary | ICD-10-CM

## 2014-01-17 DIAGNOSIS — M79609 Pain in unspecified limb: Secondary | ICD-10-CM

## 2014-01-17 DIAGNOSIS — IMO0002 Reserved for concepts with insufficient information to code with codable children: Secondary | ICD-10-CM | POA: Insufficient documentation

## 2014-01-17 DIAGNOSIS — O2200 Varicose veins of lower extremity in pregnancy, unspecified trimester: Secondary | ICD-10-CM | POA: Insufficient documentation

## 2014-01-17 LAB — URINALYSIS, ROUTINE W REFLEX MICROSCOPIC
Bilirubin Urine: NEGATIVE
GLUCOSE, UA: NEGATIVE mg/dL
Hgb urine dipstick: NEGATIVE
Ketones, ur: NEGATIVE mg/dL
LEUKOCYTES UA: NEGATIVE
NITRITE: NEGATIVE
PH: 6.5 (ref 5.0–8.0)
Protein, ur: NEGATIVE mg/dL
SPECIFIC GRAVITY, URINE: 1.02 (ref 1.005–1.030)
Urobilinogen, UA: 0.2 mg/dL (ref 0.0–1.0)

## 2014-01-17 MED ORDER — ACETAMINOPHEN 325 MG PO TABS
650.0000 mg | ORAL_TABLET | Freq: Once | ORAL | Status: AC
  Administered 2014-01-17: 650 mg via ORAL
  Filled 2014-01-17: qty 2

## 2014-01-17 NOTE — MAU Provider Note (Signed)
History     CSN: [id number]  Arrival date and time: 01/17/14 9735   First Provider Initiated Contact with Patient 01/17/14 [phone]      Chief Complaint  Patient presents with  . Swollen legs and ankles    HPI Ms. Debbie [name] is a 33 y.o. [id number] at [redacted]w[redacted]d who presents to MAU today with complaint of lower extremity edema worse on the left. She states that swelling has increased x 2 weeks and has been painful x 1 week. She states that left leg is worse than the right. She had similar issues during her last pregnancy. She has tried elevation, ice and compression stockings without major improvement in symptoms. She does have prominent varicosities bilaterally also worse on the left that are present outside of pregnancy as well, but worse recently. She states that she was on bedrest during the last month of her previous pregnancy because of these issues and had multiple evaluations for possible DVTs. She states occasional, irregular mild contractions. She denies vaginal bleeding, abnormal discharge or LOF. She reports good fetal movement.    OB History   Grav Para Term Preterm Abortions TAB SAB Ect Mult Living   4 2 2  1  1   2       Past Medical History  Diagnosis Date  . Anemia   . Depression   . Migraines   . UTI (urinary tract infection)   . Migraine   . Thyroid disease   . Varicose veins     Left > than right  . Hypothyroidism     Past Surgical History  Procedure Laterality Date  . Appendectomy    . Tonsillectomy and adenoidectomy    . Tonsillectomy  2001  . Tonsillectomy    . Dilation and evacuation N/A 04/27/2013    Procedure: DILATATION AND EVACUATION;  Surgeon: Daria Pastures, MD;  Location: [facility];  Service: Gynecology;  Laterality: N/A;    Family History  Problem Relation Age of Onset  . Arthritis Neg Hx     family  . Hypertension Mother   . Hyperlipidemia Father   . Diabetes Father   . Hypertension Father     History  Substance Use Topics  . Smoking  status: Never Smoker   . Smokeless tobacco: Never Used  . Alcohol Use: No    Allergies:  Allergies  Allergen Reactions  . Aspirin Hives  . Bactrim Swelling  . Sulfa Antibiotics Swelling    Prescriptions prior to admission  Medication Sig Dispense Refill  . acetaminophen (TYLENOL) 500 MG tablet Take 1,000 mg by mouth every 6 (six) hours as needed for mild pain, moderate pain or headache.       . levothyroxine (SYNTHROID, LEVOTHROID) 200 MCG tablet Take 200 mcg by mouth daily before breakfast.      . Prenatal Vit-Fe Fumarate-FA (PRENATAL MULTIVITAMIN) TABS tablet Take 1 tablet by mouth at bedtime.        Review of Systems  Constitutional: Negative for fever and malaise/fatigue.  Respiratory: Negative for shortness of breath.   Cardiovascular: Positive for leg swelling. Negative for chest pain.  Gastrointestinal: Negative for abdominal pain.  Genitourinary:       Neg - vaginal bleeding, discharge, LOF   Physical Exam   Blood pressure 104/60, pulse 87, temperature 98.2 F (36.8 C), temperature source Oral, resp. rate 18, height 5\' 2"  (1.575 m), weight 81.194 kg (179 lb), last menstrual period 06/18/2013.  Physical Exam  Constitutional: She is oriented to person, place,  and time. She appears well-developed and well-nourished. No distress.  HENT:  Head: Normocephalic and atraumatic.  Cardiovascular: Normal rate, regular rhythm, normal heart sounds and intact distal pulses.   Respiratory: Effort normal and breath sounds normal. No respiratory distress.  Musculoskeletal: She exhibits edema (left > right non pitting edema of the LE).  Neg - Homans sign  Neurological: She is alert and oriented to person, place, and time.  Skin: Skin is warm and dry. No erythema.  Prominent varicosities bilaterally worse on the left LE, nontender to palpation  Psychiatric: She has a normal mood and affect.   Results for orders placed during the hospital encounter of 01/17/14 (from the past 24  hour(s))  URINALYSIS, ROUTINE W REFLEX MICROSCOPIC     Status: None   Collection Time    01/17/14  8:11 AM      Result Value Ref Range   Color, Urine YELLOW  YELLOW   APPearance CLEAR  CLEAR   Specific Gravity, Urine 1.020  1.005 - 1.030   pH 6.5  5.0 - 8.0   Glucose, UA NEGATIVE  NEGATIVE mg/dL   Hgb urine dipstick NEGATIVE  NEGATIVE   Bilirubin Urine NEGATIVE  NEGATIVE   Ketones, ur NEGATIVE  NEGATIVE mg/dL   Protein, ur NEGATIVE  NEGATIVE mg/dL   Urobilinogen, UA 0.2  0.0 - 1.0 mg/dL   Nitrite NEGATIVE  NEGATIVE   Leukocytes, UA NEGATIVE  NEGATIVE    Fetal monitoring: Baseline: 125 bpm, moderate variability, + accelerations, no decelerations Contractions: None MAU Course  Procedures None  MDM Discussed with Dr. Harrington Challenger. LE venous dopplers ordered Patient requests Tylenol for pain. 650 mg Tylenol given Venous Doppler preliminary report - no evidence of DVT Dr. Harrington Challenger to MAU to discuss results and plan with patient Rx given for full length support stockings Assessment and Plan  A: Varicose veins LE edema in pregnancy  P: Discharge home Patient advised to continue Tylenol PRN pain Patient given Rx for full length support stockings Patient advised to keep appointment as scheduled with [facility] Patient may return to MAU as needed or if her condition were to change or worsen  Farris Has, PA-C  01/17/2014, 12:17 PM

## 2014-01-17 NOTE — MAU Note (Signed)
Called Vascular Lab for dopplers on lower extremely, estimated time 1030-1100, if not sooner

## 2014-01-17 NOTE — Discharge Instructions (Signed)
Compression Stockings Compression stockings are elastic stockings that "compress" your legs. This helps to increase blood flow, decrease swelling, and reduces the chance of getting blood clots in your lower legs. Compression stockings are used:  After surgery.  If you have a history of poor circulation.  If you are prone to blood clots.  If you have varicose veins.  If you sit or are bedridden for long periods of time. WEARING COMPRESSION STOCKINGS  Your compression stockings should be worn as instructed by your caregiver.  Wearing the correct stocking size is important. Your caregiver can help measure and fit you to the correct size.  When wearing your stockings, do not allow the stockings to bunch up. This is especially important around your toes or behind your knees. Keep the stockings as smooth as possible.  Do not roll the stockings downward and leave them rolled down. This can form a restrictive band around your legs and can decrease blood flow.  The stockings should be removed once a day for 1 hour or as instructed by your caregiver. When the stockings are taken off, inspect your legs and feet. Look for:  Open sores.  Red spots.  Puffy areas (swelling).  Anything that does not seem normal. IMPORTANT INFORMATION ABOUT COMPRESSION STOCKINGS  The compression stockings should be clean, dry, and in good condition before you put them on.  Do not put lotion on your legs or feet. This makes it harder to put the stockings on.  Change your stockings immediately if they become wet or soiled.  Do not wear stockings that are ripped or torn.  You may hand-wash or put your stockings in the washing machine. Use cold or warm water with mild detergent. Do not bleach your stockings. They may be air-dried or dried in the dryer on low heat.  If you have pain or have a feeling of "pins and needles" in your feet or legs, you may be wearing stockings that are too tight. Call your caregiver  right away. SEEK IMMEDIATE MEDICAL CARE IF:   You have numbness or tingling in your lower legs that does not get better quickly after the stockings are removed.  Your toes or feet become cold and blue.  You develop open sores or have red spots on your legs that do not go away. MAKE SURE YOU:   Understand these instructions.  Will watch your condition.  Will get help right away if you are not doing well or get worse. Document Released: 07/14/2009 Document Revised: 12/09/2011 Document Reviewed: 07/14/2009 [employer] Patient Information 2014 [employer].  

## 2014-01-17 NOTE — Progress Notes (Signed)
VASCULAR LAB PRELIMINARY  PRELIMINARY  PRELIMINARY  PRELIMINARY  Bilateral lower extremity venous duplex completed.    Preliminary report:  Bilateral:  No evidence of DVT, superficial thrombosis, or Baker's Cyst. Varicosities noted left greater than right with no evidence of thrombus.   [place], [place] 01/17/2014, 11:27 AM

## 2014-01-17 NOTE — MAU Note (Signed)
C/O pain in left leg and has noticed increased pain and swelling in upper L leg and ankle. Patient has significant varicosities in lower exts.

## 2014-02-24 ENCOUNTER — Inpatient Hospital Stay (HOSPITAL_COMMUNITY)
Admission: AD | Admit: 2014-02-24 | Discharge: 2014-02-24 | Disposition: A | Discharge status: Home / Self Care | Payer: Medicaid Other | Source: Ambulatory Visit | Attending: Obstetrics & Gynecology | Admitting: Obstetrics & Gynecology

## 2014-02-24 ENCOUNTER — Encounter (HOSPITAL_COMMUNITY): Payer: Self-pay | Admitting: *Deleted

## 2014-02-24 DIAGNOSIS — O4700 False labor before 37 completed weeks of gestation, unspecified trimester: Secondary | ICD-10-CM | POA: Insufficient documentation

## 2014-02-24 DIAGNOSIS — O479 False labor, unspecified: Secondary | ICD-10-CM

## 2014-02-24 LAB — URINALYSIS, ROUTINE W REFLEX MICROSCOPIC
Bilirubin Urine: NEGATIVE
Glucose, UA: NEGATIVE mg/dL
Hgb urine dipstick: NEGATIVE
Ketones, ur: NEGATIVE mg/dL
LEUKOCYTES UA: NEGATIVE
NITRITE: NEGATIVE
PH: 6.5 (ref 5.0–8.0)
Protein, ur: NEGATIVE mg/dL
SPECIFIC GRAVITY, URINE: 1.015 (ref 1.005–1.030)
UROBILINOGEN UA: 0.2 mg/dL (ref 0.0–1.0)

## 2014-02-24 MED ORDER — OXYCODONE-ACETAMINOPHEN 5-325 MG PO TABS
1.0000 | ORAL_TABLET | Freq: Once | ORAL | Status: AC
  Administered 2014-02-24: 1 via ORAL
  Filled 2014-02-24: qty 1

## 2014-02-24 MED ORDER — PROMETHAZINE HCL 25 MG PO TABS
25.0000 mg | ORAL_TABLET | Freq: Once | ORAL | Status: AC
  Administered 2014-02-24: 25 mg via ORAL
  Filled 2014-02-24: qty 1

## 2014-02-24 NOTE — MAU Provider Note (Signed)
History     CSN: [id number]  Arrival date and time: 02/24/14 6606   First Provider Initiated Contact with Patient 02/24/14 [phone]      Chief Complaint  Patient presents with  . Abdominal Pain   HPI Comments: Debbie [name] 33 y.o. [id number] [redacted]w[redacted]d patient of Dr Alwyn Pea comes to MAU with contractions that have been off and on for the last 2 days. Now they are about 5 minutes apart. She states she is drinking plenty of water and is trying to get house ready for baby and maybe overdoing it. She denies LOF or bleeding.   Abdominal Pain      Past Medical History  Diagnosis Date  . Anemia   . Depression   . Migraines   . UTI (urinary tract infection)   . Migraine   . Thyroid disease   . Varicose veins     Left > than right  . Hypothyroidism     Past Surgical History  Procedure Laterality Date  . Appendectomy    . Tonsillectomy and adenoidectomy    . Tonsillectomy  2001  . Tonsillectomy    . Dilation and evacuation N/A 04/27/2013    Procedure: DILATATION AND EVACUATION;  Surgeon: Daria Pastures, MD;  Location: [facility];  Service: Gynecology;  Laterality: N/A;    Family History  Problem Relation Age of Onset  . Arthritis Neg Hx     family  . Hypertension Mother   . Hyperlipidemia Father   . Diabetes Father   . Hypertension Father     History  Substance Use Topics  . Smoking status: Never Smoker   . Smokeless tobacco: Never Used  . Alcohol Use: No    Allergies:  Allergies  Allergen Reactions  . Aspirin Hives  . Bactrim Swelling and Rash  . Sulfa Antibiotics Swelling and Rash    Prescriptions prior to admission  Medication Sig Dispense Refill  . acetaminophen (TYLENOL) 500 MG tablet Take 1,000 mg by mouth every 6 (six) hours as needed for mild pain, moderate pain or headache.       . levothyroxine (SYNTHROID, LEVOTHROID) 200 MCG tablet Take 200 mcg by mouth daily before breakfast.      . Prenatal Vit-Fe Fumarate-FA (PRENATAL MULTIVITAMIN) TABS tablet Take 1  tablet by mouth at bedtime.        Review of Systems  Constitutional: Negative.   HENT: Negative.   Cardiovascular: Negative.   Gastrointestinal: Positive for abdominal pain.  Genitourinary: Negative.   Musculoskeletal: Negative.   Skin: Negative.   Neurological: Negative.   Psychiatric/Behavioral: Negative.    Physical Exam   Blood pressure 113/60, pulse 88, temperature 98.1 F (36.7 C), temperature source Oral, resp. rate 20, height 5' 2.5" (1.588 m), weight 84.823 kg (187 lb), last menstrual period 06/18/2013, SpO2 99.00%.  Physical Exam  Constitutional: She appears well-developed and well-nourished. No distress.  HENT:  Head: Normocephalic and atraumatic.  Eyes: Pupils are equal, round, and reactive to light.  GI: Soft. She exhibits no distension and no mass. There is no tenderness. There is no rebound and no guarding.  Genitourinary:  Genital:External Negative Cervix:1.5cm dilated and 50 % Unable to determine vertex position    Results for orders placed during the hospital encounter of 02/24/14 (from the past 24 hour(s))  URINALYSIS, ROUTINE W REFLEX MICROSCOPIC     Status: None   Collection Time    02/24/14  9:05 AM      Result Value Ref Range   Color, Urine  YELLOW  YELLOW   APPearance CLEAR  CLEAR   Specific Gravity, Urine 1.015  1.005 - 1.030   pH 6.5  5.0 - 8.0   Glucose, UA NEGATIVE  NEGATIVE mg/dL   Hgb urine dipstick NEGATIVE  NEGATIVE   Bilirubin Urine NEGATIVE  NEGATIVE   Ketones, ur NEGATIVE  NEGATIVE mg/dL   Protein, ur NEGATIVE  NEGATIVE mg/dL   Urobilinogen, UA 0.2  0.0 - 1.0 mg/dL   Nitrite NEGATIVE  NEGATIVE   Leukocytes, UA NEGATIVE  NEGATIVE    MAU Course  Procedures  MDM Will give after, percocet, phenergan and recheck one hour/ 11 am Recheck no change in cervix and contractions have stopped  Assessment and Plan   A: Braxton-Hicks  P: Advised by Dr Alwyn Pea ok to go home Follow up in office Rest/ fluids  [place] 02/24/2014,  9:59 AM

## 2014-02-24 NOTE — MAU Note (Signed)
Patient states she is having sharp abdominal pain but not sure if contractions. Denies bleeding or leaking fluid. Reports good fetal movement.

## 2014-02-24 NOTE — MAU Note (Signed)
PT SAYS  SHE STARTED HURTING       ALL NIGHT -   SHE CALLED  DR Carlos American.    SHE CALLED DR PINN THIS AM-   SHE TOLD TO COME IN.  LAST SEEN  - LAST WEEK- NO VE.    NO BLEEDING.  LAST SEX-    Tuesday.

## 2014-02-24 NOTE — MAU Provider Note (Signed)
Reviewed case with NP I agree with above.   WSP

## 2014-02-24 NOTE — Discharge Instructions (Signed)

## 2014-03-02 LAB — OB RESULTS CONSOLE GBS: STREP GROUP B AG: NEGATIVE

## 2014-03-18 ENCOUNTER — Inpatient Hospital Stay (HOSPITAL_COMMUNITY)
Admission: AD | Admit: 2014-03-18 | Discharge: 2014-03-18 | Disposition: A | Discharge status: Home / Self Care | Payer: Medicaid Other | Source: Ambulatory Visit | Attending: Obstetrics and Gynecology | Admitting: Obstetrics and Gynecology

## 2014-03-18 ENCOUNTER — Encounter (HOSPITAL_COMMUNITY): Payer: Self-pay | Admitting: *Deleted

## 2014-03-18 DIAGNOSIS — O479 False labor, unspecified: Secondary | ICD-10-CM | POA: Insufficient documentation

## 2014-03-18 NOTE — Discharge Instructions (Signed)
Fetal Movement Counts Patient Name: __________________________________________________ Patient Due Date: ____________________ Performing a fetal movement count is highly recommended in high-risk pregnancies, but it is good for every pregnant woman to do. Your caregiver may ask you to start counting fetal movements at 28 weeks of the pregnancy. Fetal movements often increase:  After eating a full meal.  After physical activity.  After eating or drinking something sweet or cold.  At rest. Pay attention to when you feel the baby is most active. This will help you notice a pattern of your baby's sleep and wake cycles and what factors contribute to an increase in fetal movement. It is important to perform a fetal movement count at the same time each day when your baby is normally most active.  HOW TO COUNT FETAL MOVEMENTS 1. Find a quiet and comfortable area to sit or lie down on your left side. Lying on your left side provides the best blood and oxygen circulation to your baby. 2. Write down the day and time on a sheet of paper or in a journal. 3. Start counting kicks, flutters, swishes, rolls, or jabs in a 2 hour period. You should feel at least 10 movements within 2 hours. 4. If you do not feel 10 movements in 2 hours, wait 2-3 hours and count again. Look for a change in the pattern or not enough counts in 2 hours. SEEK MEDICAL CARE IF:  You feel less than 10 counts in 2 hours, tried twice.  There is no movement in over an hour.  The pattern is changing or taking longer each day to reach 10 counts in 2 hours.  You feel the baby is not moving as he or she usually does. Date: ____________ Movements: ____________ Start time: ____________ Debbie [name] time: ____________  Date: ____________ Movements: ____________ Start time: ____________ Debbie [name] time: ____________ Date: ____________ Movements: ____________ Start time: ____________ Debbie [name] time: ____________ Date: ____________ Movements: ____________  Start time: ____________ Debbie [name] time: ____________ Date: ____________ Movements: ____________ Start time: ____________ Debbie [name] time: ____________ Date: ____________ Movements: ____________ Start time: ____________ Debbie [name] time: ____________ Date: ____________ Movements: ____________ Start time: ____________ Debbie [name] time: ____________ Date: ____________ Movements: ____________ Start time: ____________ Debbie [name] time: ____________  Date: ____________ Movements: ____________ Start time: ____________ Debbie [name] time: ____________ Date: ____________ Movements: ____________ Start time: ____________ Debbie [name] time: ____________ Date: ____________ Movements: ____________ Start time: ____________ Debbie [name] time: ____________ Date: ____________ Movements: ____________ Start time: ____________ Debbie [name] time: ____________ Date: ____________ Movements: ____________ Start time: ____________ Debbie [name] time: ____________ Date: ____________ Movements: ____________ Start time: ____________ Debbie [name] time: ____________ Date: ____________ Movements: ____________ Start time: ____________ Debbie [name] time: ____________  Date: ____________ Movements: ____________ Start time: ____________ Debbie [name] time: ____________ Date: ____________ Movements: ____________ Start time: ____________ Debbie [name] time: ____________ Date: ____________ Movements: ____________ Start time: ____________ Debbie [name] time: ____________ Date: ____________ Movements: ____________ Start time: ____________ Debbie [name] time: ____________ Date: ____________ Movements: ____________ Start time: ____________ Debbie [name] time: ____________ Date: ____________ Movements: ____________ Start time: ____________ Debbie [name] time: ____________ Date: ____________ Movements: ____________ Start time: ____________ Debbie [name] time: ____________  Date: ____________ Movements: ____________ Start time: ____________ Debbie [name] time: ____________ Date: ____________ Movements: ____________ Start time: ____________ Debbie [name] time:  ____________ Date: ____________ Movements: ____________ Start time: ____________ Debbie [name] time: ____________ Date: ____________ Movements: ____________ Start time: ____________ Debbie [name] time: ____________ Date: ____________ Movements: ____________ Start time: ____________ Debbie [name] time: ____________ Date: ____________ Movements: ____________ Start time: ____________ Debbie [name] time: ____________ Date: ____________ Movements: ____________ Start time: ____________ Debbie [name] time: ____________  Date: ____________ Movements: ____________ Start time: ____________ Debbie [name] time:  ____________ Date: ____________ Movements: ____________ Start time: ____________ Debbie [name] time: ____________ Date: ____________ Movements: ____________ Start time: ____________ Debbie [name] time: ____________ Date: ____________ Movements: ____________ Start time: ____________ Debbie [name] time: ____________ Date: ____________ Movements: ____________ Start time: ____________ Debbie [name] time: ____________ Date: ____________ Movements: ____________ Start time: ____________ Debbie [name] time: ____________ Date: ____________ Movements: ____________ Start time: ____________ Debbie [name] time: ____________  Date: ____________ Movements: ____________ Start time: ____________ Debbie [name] time: ____________ Date: ____________ Movements: ____________ Start time: ____________ Debbie [name] time: ____________ Date: ____________ Movements: ____________ Start time: ____________ Debbie [name] time: ____________ Date: ____________ Movements: ____________ Start time: ____________ Debbie [name] time: ____________ Date: ____________ Movements: ____________ Start time: ____________ Debbie [name] time: ____________ Date: ____________ Movements: ____________ Start time: ____________ Debbie [name] time: ____________ Date: ____________ Movements: ____________ Start time: ____________ Debbie [name] time: ____________  Date: ____________ Movements: ____________ Start time: ____________ Debbie [name] time: ____________ Date: ____________ Movements:  ____________ Start time: ____________ Debbie [name] time: ____________ Date: ____________ Movements: ____________ Start time: ____________ Debbie [name] time: ____________ Date: ____________ Movements: ____________ Start time: ____________ Debbie [name] time: ____________ Date: ____________ Movements: ____________ Start time: ____________ Debbie [name] time: ____________ Date: ____________ Movements: ____________ Start time: ____________ Debbie [name] time: ____________ Date: ____________ Movements: ____________ Start time: ____________ Debbie [name] time: ____________  Date: ____________ Movements: ____________ Start time: ____________ Debbie [name] time: ____________ Date: ____________ Movements: ____________ Start time: ____________ Debbie [name] time: ____________ Date: ____________ Movements: ____________ Start time: ____________ Debbie [name] time: ____________ Date: ____________ Movements: ____________ Start time: ____________ Debbie [name] time: ____________ Date: ____________ Movements: ____________ Start time: ____________ Debbie [name] time: ____________ Date: ____________ Movements: ____________ Start time: ____________ Debbie [name] time: ____________ Document Released: 10/16/2006 Document Revised: 09/02/2012 Document Reviewed: 07/13/2012 [employer] Patient Information 2015 [employer]. This information is not intended to replace advice given to you by your health care provider. Make sure you discuss any questions you have with your health care provider. Braxton Hicks Contractions Contractions of the uterus can occur throughout pregnancy. Contractions are not always a sign that you are in labor.  WHAT ARE BRAXTON HICKS CONTRACTIONS?  Contractions that occur before labor are called Braxton Hicks contractions, or false labor. Toward the end of pregnancy (32-34 weeks), these contractions can develop more often and may become more forceful. This is not true labor because these contractions do not result in opening (dilatation) and thinning of the cervix. They are  sometimes difficult to tell apart from true labor because these contractions can be forceful and people have different pain tolerances. You should not feel embarrassed if you go to the hospital with false labor. Sometimes, the only way to tell if you are in true labor is for your health care provider to look for changes in the cervix. If there are no prenatal problems or other health problems associated with the pregnancy, it is completely safe to be sent home with false labor and await the onset of true labor. HOW CAN YOU TELL THE DIFFERENCE BETWEEN TRUE AND FALSE LABOR? False Labor  The contractions of false labor are usually shorter and not as hard as those of true labor.   The contractions are usually irregular.   The contractions are often felt in the front of the lower abdomen and in the groin.   The contractions may go away when you walk around or change positions while lying down.   The contractions get weaker and are shorter lasting as time goes on.   The contractions do not usually become progressively stronger, regular, and closer together as with true labor.  True Labor  Contractions in true labor last 30-70 seconds, become very  regular, usually become more intense, and increase in frequency.   The contractions do not go away with walking.   The discomfort is usually felt in the top of the uterus and spreads to the lower abdomen and low back.   True labor can be determined by your health care provider with an exam. This will show that the cervix is dilating and getting thinner.  WHAT TO REMEMBER  Keep up with your usual exercises and follow other instructions given by your health care provider.   Take medicines as directed by your health care provider.   Keep your regular prenatal appointments.   Eat and drink lightly if you think you are going into labor.   If Braxton Hicks contractions are making you uncomfortable:   Change your position from lying  down or resting to walking, or from walking to resting.   Sit and rest in a tub of warm water.   Drink 2-3 glasses of water. Dehydration may cause these contractions.   Do slow and deep breathing several times an hour.  WHEN SHOULD I SEEK IMMEDIATE MEDICAL CARE? Seek immediate medical care if:  Your contractions become stronger, more regular, and closer together.   You have fluid leaking or gushing from your vagina.   You have a fever.   You pass blood-tinged mucus.   You have vaginal bleeding.   You have continuous abdominal pain.   You have low back pain that you never had before.   You feel your baby's head pushing down and causing pelvic pressure.   Your baby is not moving as much as it used to.  Document Released: 09/16/2005 Document Revised: 09/21/2013 Document Reviewed: 06/28/2013 [facility] Patient Information 2015 [place], [place]. This information is not intended to replace advice given to you by your health care provider. Make sure you discuss any questions you have with your health care provider.

## 2014-03-18 NOTE — Progress Notes (Signed)
Dr Rogue Bussing notified of pt's arrival and complaints. Orders received

## 2014-03-18 NOTE — MAU Note (Signed)
Pt presents to MAU with complaints of vaginal pressure that started this morning. Reports contractions since last pm Denies any LOF or vaginal bleeding

## 2014-03-22 ENCOUNTER — Inpatient Hospital Stay (HOSPITAL_COMMUNITY)
Admission: AD | Admit: 2014-03-22 | Discharge: 2014-03-25 | DRG: 775 | Disposition: A | Discharge status: Home / Self Care | Payer: Medicaid Other | Source: Ambulatory Visit | Attending: Obstetrics and Gynecology | Admitting: Obstetrics and Gynecology

## 2014-03-22 ENCOUNTER — Encounter (HOSPITAL_COMMUNITY): Payer: Self-pay | Admitting: *Deleted

## 2014-03-22 DIAGNOSIS — D649 Anemia, unspecified: Secondary | ICD-10-CM | POA: Diagnosis present

## 2014-03-22 DIAGNOSIS — E079 Disorder of thyroid, unspecified: Principal | ICD-10-CM | POA: Diagnosis present

## 2014-03-22 DIAGNOSIS — E039 Hypothyroidism, unspecified: Secondary | ICD-10-CM | POA: Diagnosis present

## 2014-03-22 DIAGNOSIS — IMO0001 Reserved for inherently not codable concepts without codable children: Secondary | ICD-10-CM

## 2014-03-22 DIAGNOSIS — O9902 Anemia complicating childbirth: Secondary | ICD-10-CM | POA: Diagnosis present

## 2014-03-22 DIAGNOSIS — R197 Diarrhea, unspecified: Secondary | ICD-10-CM | POA: Diagnosis present

## 2014-03-22 DIAGNOSIS — O99284 Endocrine, nutritional and metabolic diseases complicating childbirth: Principal | ICD-10-CM

## 2014-03-22 LAB — URINALYSIS, ROUTINE W REFLEX MICROSCOPIC
BILIRUBIN URINE: NEGATIVE
Glucose, UA: NEGATIVE mg/dL
Hgb urine dipstick: NEGATIVE
Ketones, ur: NEGATIVE mg/dL
Leukocytes, UA: NEGATIVE
NITRITE: NEGATIVE
PROTEIN: NEGATIVE mg/dL
UROBILINOGEN UA: 0.2 mg/dL (ref 0.0–1.0)
pH: 7.5 (ref 5.0–8.0)

## 2014-03-22 LAB — CBC
HCT: 34.3 % — ABNORMAL LOW (ref 36.0–46.0)
Hemoglobin: 11.6 g/dL — ABNORMAL LOW (ref 12.0–15.0)
MCH: 24.2 pg — ABNORMAL LOW (ref 26.0–34.0)
MCHC: 33.8 g/dL (ref 30.0–36.0)
MCV: 71.5 fL — ABNORMAL LOW (ref 78.0–100.0)
Platelets: 297 10*3/uL (ref 150–400)
RBC: 4.8 MIL/uL (ref 3.87–5.11)
RDW: 16 % — AB (ref 11.5–15.5)
WBC: 10.2 10*3/uL (ref 4.0–10.5)

## 2014-03-22 LAB — RPR

## 2014-03-22 MED ORDER — BUTORPHANOL TARTRATE 1 MG/ML IJ SOLN: 1.0000 mg | INTRAMUSCULAR | Status: DC | PRN

## 2014-03-22 MED ORDER — LACTATED RINGERS IV SOLN
500.0000 mL | INTRAVENOUS | Status: DC | PRN
  Administered 2014-03-23: 500 mL via INTRAVENOUS

## 2014-03-22 MED ORDER — LIDOCAINE HCL (PF) 1 % IJ SOLN
30.0000 mL | INTRAMUSCULAR | Status: AC | PRN
  Administered 2014-03-23 (×2): 5 mL via SUBCUTANEOUS

## 2014-03-22 MED ORDER — FLEET ENEMA 7-19 GM/118ML RE ENEM: 1.0000 | ENEMA | RECTAL | Status: DC | PRN

## 2014-03-22 MED ORDER — FENTANYL 2.5 MCG/ML BUPIVACAINE 1/10 % EPIDURAL INFUSION (WH - ANES)
14.0000 mL/h | INTRAMUSCULAR | Status: DC | PRN
  Administered 2014-03-23 (×3): 14 mL/h via EPIDURAL
  Filled 2014-03-22 (×2): qty 125

## 2014-03-22 MED ORDER — EPHEDRINE 5 MG/ML INJ
10.0000 mg | INTRAVENOUS | Status: DC | PRN
Start: 2014-03-22 — End: 2014-03-23
  Filled 2014-03-22: qty 2

## 2014-03-22 MED ORDER — TERBUTALINE SULFATE 1 MG/ML IJ SOLN: 0.2500 mg | Freq: Once | INTRAMUSCULAR | Status: AC | PRN

## 2014-03-22 MED ORDER — ONDANSETRON HCL 4 MG/2ML IJ SOLN
4.0000 mg | Freq: Four times a day (QID) | INTRAMUSCULAR | Status: DC | PRN
Start: 2014-03-22 — End: 2014-03-23
  Administered 2014-03-23: 4 mg via INTRAVENOUS
  Filled 2014-03-22: qty 2

## 2014-03-22 MED ORDER — OXYTOCIN 40 UNITS IN LACTATED RINGERS INFUSION - SIMPLE MED
62.5000 mL/h | INTRAVENOUS | Status: DC
Start: 2014-03-22 — End: 2014-03-23
  Filled 2014-03-22: qty 1000

## 2014-03-22 MED ORDER — PHENYLEPHRINE 40 MCG/ML (10ML) SYRINGE FOR IV PUSH (FOR BLOOD PRESSURE SUPPORT)
80.0000 ug | PREFILLED_SYRINGE | INTRAVENOUS | Status: DC | PRN
  Filled 2014-03-22: qty 10
  Filled 2014-03-22: qty 2

## 2014-03-22 MED ORDER — LOPERAMIDE HCL 2 MG PO CAPS
4.0000 mg | ORAL_CAPSULE | Freq: Once | ORAL | Status: AC
  Administered 2014-03-22: 4 mg via ORAL

## 2014-03-22 MED ORDER — OXYCODONE-ACETAMINOPHEN 5-325 MG PO TABS: 1.0000 | ORAL_TABLET | ORAL | Status: DC | PRN

## 2014-03-22 MED ORDER — DIPHENHYDRAMINE HCL 50 MG/ML IJ SOLN: 12.5000 mg | INTRAMUSCULAR | Status: DC | PRN

## 2014-03-22 MED ORDER — OXYTOCIN BOLUS FROM INFUSION: 500.0000 mL | INTRAVENOUS | Status: DC

## 2014-03-22 MED ORDER — LACTATED RINGERS IV SOLN
500.0000 mL | Freq: Once | INTRAVENOUS | Status: AC
  Administered 2014-03-23: 500 mL via INTRAVENOUS

## 2014-03-22 MED ORDER — PHENYLEPHRINE 40 MCG/ML (10ML) SYRINGE FOR IV PUSH (FOR BLOOD PRESSURE SUPPORT)
80.0000 ug | PREFILLED_SYRINGE | INTRAVENOUS | Status: DC | PRN
  Filled 2014-03-22: qty 2

## 2014-03-22 MED ORDER — CITRIC ACID-SODIUM CITRATE 334-500 MG/5ML PO SOLN
30.0000 mL | ORAL | Status: DC | PRN
Start: 2014-03-22 — End: 2014-03-23

## 2014-03-22 MED ORDER — ACETAMINOPHEN 325 MG PO TABS
650.0000 mg | ORAL_TABLET | ORAL | Status: DC | PRN
  Administered 2014-03-23 (×2): 650 mg via ORAL
  Filled 2014-03-22 (×2): qty 2

## 2014-03-22 MED ORDER — EPHEDRINE 5 MG/ML INJ
10.0000 mg | INTRAVENOUS | Status: DC | PRN
  Administered 2014-03-23: 10 mg via INTRAVENOUS
  Filled 2014-03-22: qty 4
  Filled 2014-03-22: qty 2

## 2014-03-22 MED ORDER — OXYTOCIN 40 UNITS IN LACTATED RINGERS INFUSION - SIMPLE MED
1.0000 m[IU]/min | INTRAVENOUS | Status: DC
  Administered 2014-03-22: 2 m[IU]/min via INTRAVENOUS

## 2014-03-22 MED ORDER — LACTATED RINGERS IV SOLN
INTRAVENOUS | Status: DC
  Administered 2014-03-22 – 2014-03-23 (×3): via INTRAVENOUS

## 2014-03-22 NOTE — MAU Provider Note (Signed)
Chief Complaint:  Contractions and Diarrhea   First Provider Initiated Contact with Patient 03/22/14 1533      HPI: Debbie [name] is a 33 y.o. [id number] at [redacted]w[redacted]d who presents to maternity admissions reporting diarrhea since yesterday. Had 5 episodes of diarrhea today that became increasingly watery. Denies fever, chills, rectal bleeding, sick contacts, exposure to food borne illness, untreated water or recent antibiotic therapy. Also reports worsening contractions although she is unsure, which pain she is feeling is uterine contractions versus intestinal cramping.  Denies leakage of fluid or vaginal bleeding. Good fetal movement.   Past Medical History: Past Medical History  Diagnosis Date  . Anemia   . Migraines   . UTI (urinary tract infection)   . Migraine   . Thyroid disease   . Varicose veins     Left > than right  . Hypothyroidism     Past obstetric history: OB History  Gravida Para Term Preterm AB SAB TAB Ectopic Multiple Living  4 2 2  1 1    2     # Outcome Date GA Lbr Len/2nd Weight Sex Delivery Anes PTL Lv  4 CUR           3 SAB           2 TRM     M SVD   Y  1 TRM     F SVD   Y      Past Surgical History: Past Surgical History  Procedure Laterality Date  . Appendectomy    . Tonsillectomy and adenoidectomy    . Tonsillectomy  2001  . Tonsillectomy    . Dilation and evacuation N/A 04/27/2013    Procedure: DILATATION AND EVACUATION;  Surgeon: Daria Pastures, MD;  Location: [facility];  Service: Gynecology;  Laterality: N/A;     Family History: Family History  Problem Relation Age of Onset  . Arthritis Neg Hx     family  . Hypertension Mother   . Hyperlipidemia Father   . Diabetes Father   . Hypertension Father     Social History: History  Substance Use Topics  . Smoking status: Never Smoker   . Smokeless tobacco: Never Used  . Alcohol Use: No    Allergies:  Allergies  Allergen Reactions  . Aspirin Hives  . Bactrim Swelling and Rash  . Sulfa  Antibiotics Swelling and Rash    Meds:  Prescriptions prior to admission  Medication Sig Dispense Refill  . acetaminophen (TYLENOL) 500 MG tablet Take 1,000 mg by mouth every 6 (six) hours as needed for mild pain, moderate pain or headache.       . levothyroxine (SYNTHROID, LEVOTHROID) 200 MCG tablet Take 200 mcg by mouth daily before breakfast.      . Prenatal Vit-Fe Fumarate-FA (PRENATAL MULTIVITAMIN) TABS tablet Take 1 tablet by mouth at bedtime.        ROS: Pertinent findings in history of present illness.  Physical Exam  Blood pressure 107/69, pulse 87, temperature 98.7 F (37.1 C), temperature source Oral, resp. rate 18, height 5\' 2"  (1.575 m), weight 86.818 kg (191 lb 6.4 oz), last menstrual period 06/18/2013. GENERAL: Well-developed, well-nourished female in mild distress.  HEENT: normocephalic HEART: normal rate RESP: normal effort ABDOMEN: Soft, non-tender, gravid appropriate for gestational age EXTREMITIES: Nontender, no edema NEURO: alert and oriented SPECULUM EXAM: Deferred Dilation: 1 Effacement (%): Thick Cervical Position: Posterior Station: -2 Exam by:: Velna Ochs RN  FHT:  Baseline 150 , moderate variability, accelerations present, no  decelerations Contractions: q 3-5 mins. Mild-moderate   Labs: Results for orders placed during the hospital encounter of 03/22/14 (from the past 24 hour(s))  URINALYSIS, ROUTINE W REFLEX MICROSCOPIC     Status: Abnormal   Collection Time    03/22/14  2:15 PM      Result Value Ref Range   Color, Urine YELLOW  YELLOW   APPearance CLEAR  CLEAR   Specific Gravity, Urine <1.005 (*) 1.005 - 1.030   pH 7.5  5.0 - 8.0   Glucose, UA NEGATIVE  NEGATIVE mg/dL   Hgb urine dipstick NEGATIVE  NEGATIVE   Bilirubin Urine NEGATIVE  NEGATIVE   Ketones, ur NEGATIVE  NEGATIVE mg/dL   Protein, ur NEGATIVE  NEGATIVE mg/dL   Urobilinogen, UA 0.2  0.0 - 1.0 mg/dL   Nitrite NEGATIVE  NEGATIVE   Leukocytes, UA NEGATIVE  NEGATIVE    Imaging:   No results found. MAU Course: Patient reports the contractions are getting much worse and are distinct from intestinal cramping. No bowel movements while maternity admissions. No improvement with PO fluids.  1650: Dilation: 3 Effacement (%): 40 Cervical Position: Posterior Station: -3 Presentation: Vertex Exam by:: Marlou Porch CNM  Dr. Philis Pique notified of patient's diarrhea, worsening contractions and cervical change. Admit to labor and delivery per Dr. Philis Pique.  Assessment: 1. Labor: Early 2. Fetal Wellbeing: Category I  3. Pain Control: Requesting IV pain meds 4. GBS: neg per pt 5. 39.1 week IUP  Plan:  1. Admit to BS per consult with MD 2. Routine L&D orders 3. Analgesia/anesthesia PRN    Manya Silvas, CNM 03/22/2014 5:06 PM

## 2014-03-22 NOTE — H&P (Signed)
33 y.o. [redacted]w[redacted]d  [id number] comes in c/o diarrhea and contractions.  Pt started with diarrhea last night and took one dose of immodium with some relief.  Pt has had no sick contacts and no antibiotics.  She then changed her cervix to 3 and was admitted for labor.  Otherwise has good fetal movement and no bleeding.  Past Medical History  Diagnosis Date  . Anemia   . Migraines   . UTI (urinary tract infection)   . Migraine   . Thyroid disease   . Varicose veins     Left > than right  . Hypothyroidism     Past Surgical History  Procedure Laterality Date  . Appendectomy    . Tonsillectomy and adenoidectomy    . Tonsillectomy  2001  . Tonsillectomy    . Dilation and evacuation N/A 04/27/2013    Procedure: DILATATION AND EVACUATION;  Surgeon: Daria Pastures, MD;  Location: [facility];  Service: Gynecology;  Laterality: N/A;    OB History  Gravida Para Term Preterm AB SAB TAB Ectopic Multiple Living  6 2 2  3 2 1   2     # Outcome Date GA Lbr Len/2nd Weight Sex Delivery Anes PTL Lv  6 CUR           5 SAB           4 TRM     M SVD   Y  3 TRM     F SVD   Y  2 TAB           1 SAB               History   Social History  . Marital Status: Single    Spouse Name: N/A    Number of Children: N/A  . Years of Education: N/A   Occupational History  . Not on file.   Social History Main Topics  . Smoking status: Never Smoker   . Smokeless tobacco: Never Used  . Alcohol Use: No  . Drug Use: No  . Sexual Activity: Yes   Other Topics Concern  . Not on file   Social History Narrative  . No narrative on file   Aspirin; Bactrim; and Sulfa antibiotics    Prenatal Transfer Tool  Maternal Diabetes: No Genetic Screening: Normal- first tri normal Maternal Ultrasounds/Referrals: Normal Fetal Ultrasounds or other Referrals:  None Maternal Substance Abuse:  No Significant Maternal Medications:  Meds include: Syntroid Significant Maternal Lab Results: None  Other PNC:  uncomplicated.    Filed Vitals:   03/22/14 2013  BP: 111/62  Pulse: 82  Temp:   Resp:      Lungs/Cor:  NAD Abdomen:  soft, gravid Ex:  no cords, erythema SVE:  3/th/-3 per nurse on admission. FHTs:  120, good STV, NST R Toco:  q5-10   A/P   Term in labor.  GBS neg.  Pt's thyroid has been stable.  GC/CT documented in [place] neg/neg.  Samael Blades A

## 2014-03-22 NOTE — MAU Note (Signed)
Diarrhea started 2 days ago, uc's for the past week, nausea today, but no vomiting.  Denies bleeding or LOF.

## 2014-03-23 ENCOUNTER — Inpatient Hospital Stay (HOSPITAL_COMMUNITY): Payer: Medicaid Other | Admitting: Anesthesiology

## 2014-03-23 ENCOUNTER — Encounter (HOSPITAL_COMMUNITY): Payer: Medicaid Other | Admitting: Anesthesiology

## 2014-03-23 ENCOUNTER — Encounter (HOSPITAL_COMMUNITY): Payer: Self-pay | Admitting: *Deleted

## 2014-03-23 DIAGNOSIS — O479 False labor, unspecified: Secondary | ICD-10-CM

## 2014-03-23 MED ORDER — TETANUS-DIPHTH-ACELL PERTUSSIS 5-2.5-18.5 LF-MCG/0.5 IM SUSP: 0.5000 mL | Freq: Once | INTRAMUSCULAR | Status: DC

## 2014-03-23 MED ORDER — LANOLIN HYDROUS EX OINT: TOPICAL_OINTMENT | CUTANEOUS | Status: DC | PRN

## 2014-03-23 MED ORDER — WITCH HAZEL-GLYCERIN EX PADS: 1.0000 "application " | MEDICATED_PAD | CUTANEOUS | Status: DC | PRN

## 2014-03-23 MED ORDER — OXYCODONE-ACETAMINOPHEN 5-325 MG PO TABS
1.0000 | ORAL_TABLET | ORAL | Status: DC | PRN
  Administered 2014-03-23 – 2014-03-25 (×6): 1 via ORAL
  Filled 2014-03-23 (×7): qty 1

## 2014-03-23 MED ORDER — ONDANSETRON HCL 4 MG PO TABS: 4.0000 mg | ORAL_TABLET | ORAL | Status: DC | PRN

## 2014-03-23 MED ORDER — METHYLERGONOVINE MALEATE 0.2 MG/ML IJ SOLN: 0.2000 mg | INTRAMUSCULAR | Status: DC | PRN

## 2014-03-23 MED ORDER — LEVOTHYROXINE SODIUM 200 MCG PO TABS
200.0000 ug | ORAL_TABLET | Freq: Every day | ORAL | Status: DC
  Administered 2014-03-23: 200 ug via ORAL
  Filled 2014-03-23 (×2): qty 1

## 2014-03-23 MED ORDER — ONDANSETRON HCL 4 MG/2ML IJ SOLN: 4.0000 mg | INTRAMUSCULAR | Status: DC | PRN

## 2014-03-23 MED ORDER — BENZOCAINE-MENTHOL 20-0.5 % EX AERO
1.0000 "application " | INHALATION_SPRAY | CUTANEOUS | Status: DC | PRN
  Administered 2014-03-23: 1 via TOPICAL
  Filled 2014-03-23 (×2): qty 56

## 2014-03-23 MED ORDER — IBUPROFEN 600 MG PO TABS
600.0000 mg | ORAL_TABLET | Freq: Four times a day (QID) | ORAL | Status: DC
  Administered 2014-03-23 – 2014-03-25 (×7): 600 mg via ORAL
  Filled 2014-03-23 (×7): qty 1

## 2014-03-23 MED ORDER — METHYLERGONOVINE MALEATE 0.2 MG PO TABS: 0.2000 mg | ORAL_TABLET | ORAL | Status: DC | PRN

## 2014-03-23 MED ORDER — SENNOSIDES-DOCUSATE SODIUM 8.6-50 MG PO TABS
2.0000 | ORAL_TABLET | ORAL | Status: DC
  Administered 2014-03-23 – 2014-03-24 (×2): 2 via ORAL
  Filled 2014-03-23 (×2): qty 2

## 2014-03-23 MED ORDER — SIMETHICONE 80 MG PO CHEW: 80.0000 mg | CHEWABLE_TABLET | ORAL | Status: DC | PRN

## 2014-03-23 MED ORDER — LEVOTHYROXINE SODIUM 100 MCG PO TABS
200.0000 ug | ORAL_TABLET | Freq: Every day | ORAL | Status: DC
  Administered 2014-03-24 – 2014-03-25 (×2): 200 ug via ORAL
  Filled 2014-03-23 (×2): qty 2

## 2014-03-23 MED ORDER — ZOLPIDEM TARTRATE 5 MG PO TABS: 5.0000 mg | ORAL_TABLET | Freq: Every evening | ORAL | Status: DC | PRN

## 2014-03-23 MED ORDER — LIDOCAINE HCL (PF) 1 % IJ SOLN
INTRAMUSCULAR | Status: AC
  Filled 2014-03-23: qty 30

## 2014-03-23 MED ORDER — DIBUCAINE 1 % RE OINT: 1.0000 "application " | TOPICAL_OINTMENT | RECTAL | Status: DC | PRN

## 2014-03-23 MED ORDER — PRENATAL MULTIVITAMIN CH
1.0000 | ORAL_TABLET | Freq: Every day | ORAL | Status: DC
  Administered 2014-03-24: 1 via ORAL
  Filled 2014-03-23: qty 1

## 2014-03-23 MED ORDER — DIPHENHYDRAMINE HCL 25 MG PO CAPS: 25.0000 mg | ORAL_CAPSULE | Freq: Four times a day (QID) | ORAL | Status: DC | PRN

## 2014-03-23 NOTE — Progress Notes (Signed)
UR chart review completed.  

## 2014-03-23 NOTE — Progress Notes (Signed)
  S: Comfortable with epidural O: Filed Vitals:   03/23/14 0719 03/23/14 0730 03/23/14 0800 03/23/14 0830  BP:  99/71 112/75 117/64  Pulse: 91 152 81 86  Temp:   97.5 F (36.4 C)   TempSrc:   Oral   Resp:  18 20   Height:      Weight:      SpO2: 91%       FHR 140 reactive, cat I Cvx 3/50/-3, soft toco irreg  AROM clear fluid, IUPC placed  A/P 1) Cont pitocin 2) FWB reassuring

## 2014-03-23 NOTE — Anesthesia Procedure Notes (Signed)
Epidural Patient location during procedure: OB Start time: 03/23/2014 12:15 AM End time: 03/23/2014 12:41 AM  Staffing Anesthesiologist: MOSER, CHRIS Performed by: anesthesiologist   Preanesthetic Checklist Completed: patient identified, surgical consent, pre-op evaluation, timeout performed, IV checked, risks and benefits discussed and monitors and equipment checked  Epidural Patient position: sitting Prep: site prepped and draped and DuraPrep Patient monitoring: heart rate, cardiac monitor, continuous pulse ox and blood pressure Approach: midline Location: L3-L4 Injection technique: LOR saline  Needle:  Needle type: Tuohy  Needle gauge: 17 G Needle length: 9 cm Needle insertion depth: 6 cm Catheter type: closed end flexible Catheter size: 19 Gauge Catheter at skin depth: 12 cm Test dose: Other  Assessment Events: blood not aspirated, injection not painful, no injection resistance, negative IV test and no paresthesia  Additional Notes H+P and labs checked, risks and benefits discussed with the patient, consent obtained, procedure tolerated well and without complications.  Reason for block:procedure for pain

## 2014-03-23 NOTE — Anesthesia Preprocedure Evaluation (Signed)
Anesthesia Evaluation  Patient identified by MRN, date of birth, ID band Patient awake    Reviewed: Allergy & Precautions, H&P , NPO status , Patient's Chart, lab work & pertinent test results  History of Anesthesia Complications Negative for: history of anesthetic complications  Airway Mallampati: II TM Distance: >3 FB Neck ROM: Full    Dental   Pulmonary          Cardiovascular negative cardio ROS  Rhythm:Regular     Neuro/Psych  Headaches,    GI/Hepatic negative GI ROS, Neg liver ROS,   Endo/Other  Hypothyroidism   Renal/GU negative Renal ROS     Musculoskeletal   Abdominal   Peds  Hematology  (+) anemia ,   Anesthesia Other Findings   Reproductive/Obstetrics (+) Pregnancy                           Anesthesia Physical Anesthesia Plan  ASA: II  Anesthesia Plan: Epidural   Post-op Pain Management:    Induction:   Airway Management Planned:   Additional Equipment:   Intra-op Plan:   Post-operative Plan:   Informed Consent: I have reviewed the patients History and Physical, chart, labs and discussed the procedure including the risks, benefits and alternatives for the proposed anesthesia with the patient or authorized representative who has indicated his/her understanding and acceptance.   Dental advisory given  Plan Discussed with: Anesthesiologist  Anesthesia Plan Comments:         Anesthesia Quick Evaluation

## 2014-03-23 NOTE — Consult Note (Signed)
Neonatology Note:  Attendance at Code Apgar:   Our team responded to a Code Apgar call to room # 165 following NSVD, due to infant with apnea. The requesting physician was Dr. Harrington Challenger. The mother is a [id number] A neg, GBS neg with hypothyroidism, on Synthroid. ROM occurred 6 hours PTD and the fluid was clear.  At delivery, there was a tight CAN and the baby was floppy, apneic, and blue. The OB nursing staff in attendance gave vigorous stimulation and a Code Apgar was called. Our team arrived at 2 minutes of life, at which time the baby was getting PPV. The baby was showing some respiratory effort, so PPV was stopped. A pulse oximeter was placed and BBO2 was given for another minute. The O2 saturations were normal in room air by 4-5 minutes of life.  Tone was normal by 8-9 minutes. Perfusion and color were good and there was no resp distress. Ap 3/8/9.  I spoke with the parents in the DR, then transferred the baby to the Pediatrician's care.   Real Cons, MD

## 2014-03-24 LAB — CBC
HCT: 30.9 % — ABNORMAL LOW (ref 36.0–46.0)
Hemoglobin: 10.6 g/dL — ABNORMAL LOW (ref 12.0–15.0)
MCH: 24.3 pg — AB (ref 26.0–34.0)
MCHC: 34.3 g/dL (ref 30.0–36.0)
MCV: 70.7 fL — AB (ref 78.0–100.0)
Platelets: 258 10*3/uL (ref 150–400)
RBC: 4.37 MIL/uL (ref 3.87–5.11)
RDW: 15.6 % — AB (ref 11.5–15.5)
WBC: 15.9 10*3/uL — ABNORMAL HIGH (ref 4.0–10.5)

## 2014-03-24 MED ORDER — RHO D IMMUNE GLOBULIN 1500 UNIT/2ML IJ SOSY
300.0000 ug | PREFILLED_SYRINGE | Freq: Once | INTRAMUSCULAR | Status: AC
  Administered 2014-03-24: 300 ug via INTRAMUSCULAR
  Filled 2014-03-24: qty 2

## 2014-03-24 NOTE — Progress Notes (Signed)
03/24/14 1200  Clinical Encounter Type  Visited With Patient and family together;Health care provider (RN)  Visit Type Spiritual support  Referral From Nurse  [place] (desires Catholic priest to bless baby with holy water)   Consulted with RN and patient via phone and then visited in person.  Shenicka and her husband Beatriz Chancellor would like a Catholic priest to baptize their baby.  Because they are not members of a local parish, I contacted the parish on call for the month.  Father Christia Reading of [place] is unable to help because he is out of town, but referred me to [facility], where, per pt's permission, I left a message with her request.  Plan to check on progress later this afternoon.  In the meantime, made a brief Spiritual Care visit with family and offered prayer of blessing for baby Vanice Sarah and family.  They voiced appreciation for all of this care and support.  Please page if further needs arise:  [phone].  Thank you.  [place], [place]

## 2014-03-24 NOTE — Anesthesia Postprocedure Evaluation (Signed)
  Anesthesia Post-op Note  Patient: Debbie [name]  Procedure(s) Performed: * No procedures listed *  Patient Location: Mother/Baby  Anesthesia Type:Epidural  Level of Consciousness: awake, alert , oriented and patient cooperative  Airway and Oxygen Therapy: Patient Spontanous Breathing  Post-op Pain: none  Post-op Assessment: Patient's Cardiovascular Status Stable, Respiratory Function Stable, Patent Airway, No signs of Nausea or vomiting, Adequate PO intake, Pain level controlled, No headache, No backache, No residual numbness and No residual motor weakness  Post-op Vital Signs: Reviewed and stable  Last Vitals:  Filed Vitals:   03/24/14 0530  BP: 89/49  Pulse: 62  Temp: 36.7 C  Resp: 18    Complications: No apparent anesthesia complications

## 2014-03-24 NOTE — Progress Notes (Signed)
PPD#1 Pt without complaints. Lochia-mod VSSAF IMP/ doing well Plan/ routine care.

## 2014-03-25 LAB — TYPE AND SCREEN
ABO/RH(D): A NEG
Antibody Screen: POSITIVE
DAT, IgG: NEGATIVE
Unit division: 0
Unit division: 0

## 2014-03-25 LAB — RH IG WORKUP (INCLUDES ABO/RH)
ABO/RH(D): A NEG
ANTIBODY SCREEN: POSITIVE
DAT, IGG: NEGATIVE
FETAL SCREEN: NEGATIVE
Gestational Age(Wks): 39
Unit division: 0

## 2014-03-25 NOTE — Discharge Summary (Signed)
Obstetric Discharge Summary Reason for Admission: onset of labor Prenatal Procedures: none Intrapartum Procedures: vacuum Postpartum Procedures: Rho(D) Ig Complications-Operative and Postpartum: none Hemoglobin  Date Value Ref Range Status  03/24/2014 10.6* 12.0 - 15.0 g/dL Final     HCT  Date Value Ref Range Status  03/24/2014 30.9* 36.0 - 46.0 % Final    Discharge Diagnoses: Term Pregnancy-delivered  Discharge Information: Date: 03/25/2014 Activity: pelvic rest Diet: routine Medications: Ibuprofen Condition: stable Instructions: refer to practice specific booklet Discharge to: home Follow-up Information   Follow up with Marcial Pacas., MD In 4 weeks.   Specialty:  Obstetrics and Gynecology   Contact information:   [place] [address] [phone]       Newborn Data: Live born female  Birth Weight: 6 lb 13.9 oz (3115 g) APGAR: 3, 8  Home with mother.  HORVATH,MICHELLE A 03/25/2014, 7:55 AM

## 2014-03-25 NOTE — Progress Notes (Signed)
Patient is eating, ambulating, voiding.  Pain control is good.  Filed Vitals:   03/23/14 2124 03/24/14 0530 03/24/14 1810 03/25/14 0539  BP: 105/63 89/49 113/68 105/64  Pulse: 76 62 84 72  Temp: 98.3 F (36.8 C) 98 F (36.7 C) 98.2 F (36.8 C) 97.8 F (36.6 C)  TempSrc: Oral Oral Oral Oral  Resp: 18 18 18 18   Height:      Weight:      SpO2: 98%   100%    Fundus firm Perineum without swelling.  Lab Results  Component Value Date   WBC 15.9* 03/24/2014   HGB 10.6* 03/24/2014   HCT 30.9* 03/24/2014   MCV 70.7* 03/24/2014   PLT 258 03/24/2014    --/--/A NEG (06/25 1925)/RI  A/P Post partum day 2.  Routine care.  Expect d/c today.   Aneg- baby Rh pos- Rhogam. HORVATH,MICHELLE A

## 2014-05-25 ENCOUNTER — Encounter (HOSPITAL_COMMUNITY): Payer: Self-pay

## 2014-05-30 ENCOUNTER — Encounter (HOSPITAL_COMMUNITY): Payer: Self-pay | Admitting: Pharmacist

## 2014-06-07 ENCOUNTER — Other Ambulatory Visit: Payer: Self-pay | Admitting: Obstetrics and Gynecology

## 2014-06-07 NOTE — H&P (Signed)
33 y.o. yo [id number] desires permanent sterilization.  Past Medical History  Diagnosis Date  . Anemia   . Migraines   . UTI (urinary tract infection)   . Migraine   . Thyroid disease   . Varicose veins     Left > than right  . Hypothyroidism    Past Surgical History  Procedure Laterality Date  . Appendectomy    . Tonsillectomy and adenoidectomy    . Tonsillectomy  2001  . Tonsillectomy    . Dilation and evacuation N/A 04/27/2013    Procedure: DILATATION AND EVACUATION;  Surgeon: Daria Pastures, MD;  Location: [facility];  Service: Gynecology;  Laterality: N/A;    History   Social History  . Marital Status: Single    Spouse Name: N/A    Number of Children: N/A  . Years of Education: N/A   Occupational History  . Not on file.   Social History Main Topics  . Smoking status: Never Smoker   . Smokeless tobacco: Never Used  . Alcohol Use: No  . Drug Use: No  . Sexual Activity: Yes   Other Topics Concern  . Not on file   Social History Narrative  . No narrative on file    No current facility-administered medications on file prior to encounter.   Current Outpatient Prescriptions on File Prior to Encounter  Medication Sig Dispense Refill  . levothyroxine (SYNTHROID, LEVOTHROID) 200 MCG tablet Take 200 mcg by mouth daily before breakfast.        Allergies  Allergen Reactions  . Aspirin Hives    Pt states she can take Ibuprofen  . Bactrim Swelling and Rash  . Sulfa Antibiotics Swelling and Rash    @VITALS2 @  Lungs: clear to ascultation Cor:  RRR Abdomen:  soft, nontender, nondistended. Ex:  no cords, erythema Pelvic:  Normal external, normal uterus and ovaries.  A:  Desires sterilization- all alternatives d/w pt including long acting reversible contraception.   P: All risks, benefits and alternatives d/w patient and she desires to proceed.     Tatsuya Okray A

## 2014-06-08 ENCOUNTER — Ambulatory Visit (HOSPITAL_COMMUNITY)
Admission: RE | Admit: 2014-06-08 | Discharge: 2014-06-08 | Disposition: A | Discharge status: Home / Self Care | Payer: Medicaid Other | Source: Ambulatory Visit | Attending: Obstetrics and Gynecology | Admitting: Obstetrics and Gynecology

## 2014-06-08 ENCOUNTER — Encounter (HOSPITAL_COMMUNITY): Payer: Medicaid Other | Admitting: Anesthesiology

## 2014-06-08 ENCOUNTER — Encounter (HOSPITAL_COMMUNITY): Payer: Self-pay | Admitting: Anesthesiology

## 2014-06-08 ENCOUNTER — Encounter (HOSPITAL_COMMUNITY)
Admission: RE | Disposition: A | Discharge status: Home / Self Care | Payer: Self-pay | Source: Ambulatory Visit | Attending: Obstetrics and Gynecology

## 2014-06-08 ENCOUNTER — Ambulatory Visit (HOSPITAL_COMMUNITY): Payer: Medicaid Other | Admitting: Anesthesiology

## 2014-06-08 DIAGNOSIS — Z302 Encounter for sterilization: Secondary | ICD-10-CM | POA: Insufficient documentation

## 2014-06-08 DIAGNOSIS — E039 Hypothyroidism, unspecified: Secondary | ICD-10-CM | POA: Insufficient documentation

## 2014-06-08 DIAGNOSIS — Z9889 Other specified postprocedural states: Secondary | ICD-10-CM

## 2014-06-08 HISTORY — PX: LAPAROSCOPIC TUBAL LIGATION: SHX1937

## 2014-06-08 LAB — CBC
HCT: 36.7 % (ref 36.0–46.0)
HEMOGLOBIN: 12.5 g/dL (ref 12.0–15.0)
MCH: 23.3 pg — ABNORMAL LOW (ref 26.0–34.0)
MCHC: 34.1 g/dL (ref 30.0–36.0)
MCV: 68.3 fL — ABNORMAL LOW (ref 78.0–100.0)
PLATELETS: 291 10*3/uL (ref 150–400)
RBC: 5.37 MIL/uL — ABNORMAL HIGH (ref 3.87–5.11)
RDW: 15 % (ref 11.5–15.5)
WBC: 6.7 10*3/uL (ref 4.0–10.5)

## 2014-06-08 LAB — PREGNANCY, URINE: Preg Test, Ur: NEGATIVE

## 2014-06-08 SURGERY — LIGATION, FALLOPIAN TUBE, LAPAROSCOPIC
Anesthesia: General | Site: Abdomen | Laterality: Bilateral

## 2014-06-08 MED ORDER — FENTANYL CITRATE 0.05 MG/ML IJ SOLN
INTRAMUSCULAR | Status: AC
  Filled 2014-06-08: qty 2

## 2014-06-08 MED ORDER — SCOPOLAMINE 1 MG/3DAYS TD PT72
MEDICATED_PATCH | TRANSDERMAL | Status: AC
  Administered 2014-06-08: 1.5 mg via TRANSDERMAL
  Filled 2014-06-08: qty 1

## 2014-06-08 MED ORDER — PROPOFOL 10 MG/ML IV EMUL
INTRAVENOUS | Status: AC
  Filled 2014-06-08: qty 20

## 2014-06-08 MED ORDER — ONDANSETRON HCL 4 MG/2ML IJ SOLN
INTRAMUSCULAR | Status: DC | PRN
  Administered 2014-06-08: 4 mg via INTRAVENOUS

## 2014-06-08 MED ORDER — FENTANYL CITRATE 0.05 MG/ML IJ SOLN
INTRAMUSCULAR | Status: DC | PRN
  Administered 2014-06-08 (×5): 50 ug via INTRAVENOUS

## 2014-06-08 MED ORDER — KETOROLAC TROMETHAMINE 30 MG/ML IJ SOLN
INTRAMUSCULAR | Status: DC | PRN
  Administered 2014-06-08: 30 mg via INTRAVENOUS

## 2014-06-08 MED ORDER — EPHEDRINE 5 MG/ML INJ
INTRAVENOUS | Status: AC
  Filled 2014-06-08: qty 10

## 2014-06-08 MED ORDER — ACETAMINOPHEN 160 MG/5ML PO SOLN
960.0000 mg | Freq: Four times a day (QID) | ORAL | Status: DC | PRN
  Administered 2014-06-08: 960 mg via ORAL

## 2014-06-08 MED ORDER — LIDOCAINE HCL (CARDIAC) 20 MG/ML IV SOLN
INTRAVENOUS | Status: DC | PRN
  Administered 2014-06-08: 30 mg via INTRAVENOUS
  Administered 2014-06-08: 70 mg via INTRAVENOUS

## 2014-06-08 MED ORDER — FLUMAZENIL 0.5 MG/5ML IV SOLN
INTRAVENOUS | Status: AC
  Filled 2014-06-08: qty 5

## 2014-06-08 MED ORDER — DEXAMETHASONE SODIUM PHOSPHATE 4 MG/ML IJ SOLN
INTRAMUSCULAR | Status: AC
  Filled 2014-06-08: qty 1

## 2014-06-08 MED ORDER — FENTANYL CITRATE 0.05 MG/ML IJ SOLN
INTRAMUSCULAR | Status: AC
  Filled 2014-06-08: qty 5

## 2014-06-08 MED ORDER — KETOROLAC TROMETHAMINE 30 MG/ML IJ SOLN
INTRAMUSCULAR | Status: AC
  Filled 2014-06-08: qty 1

## 2014-06-08 MED ORDER — FLUMAZENIL 0.5 MG/5ML IV SOLN
INTRAVENOUS | Status: DC | PRN
  Administered 2014-06-08: 0.2 mg via INTRAVENOUS

## 2014-06-08 MED ORDER — LACTATED RINGERS IV SOLN
INTRAVENOUS | Status: DC
  Administered 2014-06-08 (×2): via INTRAVENOUS

## 2014-06-08 MED ORDER — ACETAMINOPHEN 160 MG/5ML PO SOLN
ORAL | Status: AC
  Administered 2014-06-08: 960 mg via ORAL
  Filled 2014-06-08: qty 40.6

## 2014-06-08 MED ORDER — KETOROLAC TROMETHAMINE 30 MG/ML IJ SOLN
30.0000 mg | Freq: Once | INTRAMUSCULAR | Status: AC
  Administered 2014-06-08: 30 mg via INTRAMUSCULAR

## 2014-06-08 MED ORDER — ROCURONIUM BROMIDE 100 MG/10ML IV SOLN
INTRAVENOUS | Status: AC
  Filled 2014-06-08: qty 1

## 2014-06-08 MED ORDER — DEXAMETHASONE SODIUM PHOSPHATE 10 MG/ML IJ SOLN
INTRAMUSCULAR | Status: DC | PRN
Start: 2014-06-08 — End: 2014-06-08
  Administered 2014-06-08: 4 mg via INTRAVENOUS

## 2014-06-08 MED ORDER — MIDAZOLAM HCL 2 MG/2ML IJ SOLN
INTRAMUSCULAR | Status: DC | PRN
  Administered 2014-06-08: 2 mg via INTRAVENOUS

## 2014-06-08 MED ORDER — LIDOCAINE HCL (CARDIAC) 20 MG/ML IV SOLN
INTRAVENOUS | Status: AC
  Filled 2014-06-08: qty 5

## 2014-06-08 MED ORDER — GLYCOPYRROLATE 0.2 MG/ML IJ SOLN
INTRAMUSCULAR | Status: DC | PRN
  Administered 2014-06-08: 0.6 mg via INTRAVENOUS

## 2014-06-08 MED ORDER — ROCURONIUM BROMIDE 100 MG/10ML IV SOLN
INTRAVENOUS | Status: DC | PRN
  Administered 2014-06-08: 25 mg via INTRAVENOUS

## 2014-06-08 MED ORDER — FENTANYL CITRATE 0.05 MG/ML IJ SOLN
25.0000 ug | INTRAMUSCULAR | Status: DC | PRN
  Administered 2014-06-08 (×3): 50 ug via INTRAVENOUS

## 2014-06-08 MED ORDER — OXYCODONE-ACETAMINOPHEN 5-325 MG PO TABS: 1.0000 | ORAL_TABLET | ORAL | Status: DC | PRN

## 2014-06-08 MED ORDER — EPHEDRINE SULFATE 50 MG/ML IJ SOLN
INTRAMUSCULAR | Status: DC | PRN
  Administered 2014-06-08: 10 mg via INTRAVENOUS

## 2014-06-08 MED ORDER — OXYCODONE-ACETAMINOPHEN 5-325 MG PO TABS
ORAL_TABLET | ORAL | Status: AC
  Filled 2014-06-08: qty 1

## 2014-06-08 MED ORDER — ONDANSETRON HCL 4 MG/2ML IJ SOLN
INTRAMUSCULAR | Status: AC
  Filled 2014-06-08: qty 2

## 2014-06-08 MED ORDER — NEOSTIGMINE METHYLSULFATE 10 MG/10ML IV SOLN
INTRAVENOUS | Status: DC | PRN
  Administered 2014-06-08: 3 mg via INTRAVENOUS

## 2014-06-08 MED ORDER — OXYCODONE-ACETAMINOPHEN 5-325 MG PO TABS
1.0000 | ORAL_TABLET | Freq: Once | ORAL | Status: AC
  Administered 2014-06-08: 1 via ORAL

## 2014-06-08 MED ORDER — PROPOFOL 10 MG/ML IV BOLUS
INTRAVENOUS | Status: DC | PRN
  Administered 2014-06-08: 180 mg via INTRAVENOUS

## 2014-06-08 MED ORDER — SCOPOLAMINE 1 MG/3DAYS TD PT72
1.0000 | MEDICATED_PATCH | Freq: Once | TRANSDERMAL | Status: DC
  Administered 2014-06-08: 1.5 mg via TRANSDERMAL

## 2014-06-08 MED ORDER — GLYCOPYRROLATE 0.2 MG/ML IJ SOLN
INTRAMUSCULAR | Status: AC
  Filled 2014-06-08: qty 3

## 2014-06-08 MED ORDER — MIDAZOLAM HCL 2 MG/2ML IJ SOLN
INTRAMUSCULAR | Status: AC
  Filled 2014-06-08: qty 2

## 2014-06-08 MED ORDER — FENTANYL CITRATE 0.05 MG/ML IJ SOLN
INTRAMUSCULAR | Status: AC
  Administered 2014-06-08: 50 ug via INTRAVENOUS
  Filled 2014-06-08: qty 2

## 2014-06-08 SURGICAL SUPPLY — 23 items
ADH SKN CLS APL DERMABOND .7 (GAUZE/BANDAGES/DRESSINGS) ×1
ADH SKN CLS LQ APL DERMABOND (GAUZE/BANDAGES/DRESSINGS) ×1
CATH ROBINSON RED A/P 16FR (CATHETERS) ×2 IMPLANT
CHLORAPREP W/TINT 26ML (MISCELLANEOUS) ×2 IMPLANT
CLIP FILSHIE TUBAL LIGA STRL (Clip) ×2 IMPLANT
CLOTH BEACON ORANGE TIMEOUT ST (SAFETY) ×2 IMPLANT
DERMABOND ADHESIVE PROPEN (GAUZE/BANDAGES/DRESSINGS) ×1
DERMABOND ADVANCED (GAUZE/BANDAGES/DRESSINGS) ×1
DERMABOND ADVANCED .7 DNX12 (GAUZE/BANDAGES/DRESSINGS) ×1 IMPLANT
DERMABOND ADVANCED .7 DNX6 (GAUZE/BANDAGES/DRESSINGS) IMPLANT
DRSG COVADERM PLUS 2X2 (GAUZE/BANDAGES/DRESSINGS) ×4 IMPLANT
DRSG OPSITE POSTOP 3X4 (GAUZE/BANDAGES/DRESSINGS) ×1 IMPLANT
GLOVE BIO SURGEON STRL SZ7 (GLOVE) ×2 IMPLANT
GOWN STRL REUS W/TWL LRG LVL3 (GOWN DISPOSABLE) ×4 IMPLANT
NEEDLE INSUFFLATION 120MM (ENDOMECHANICALS) ×2 IMPLANT
PACK LAPAROSCOPY BASIN (CUSTOM PROCEDURE TRAY) ×2 IMPLANT
SUT VIC AB 2-0 UR5 27 (SUTURE) ×2 IMPLANT
SUT VICRYL RAPIDE 3 0 (SUTURE) ×1 IMPLANT
TOWEL OR 17X24 6PK STRL BLUE (TOWEL DISPOSABLE) ×4 IMPLANT
TROCAR XCEL DIL TIP R 11M (ENDOMECHANICALS) ×2 IMPLANT
TROCAR XCEL NON-BLD 5MMX100MML (ENDOMECHANICALS) ×1 IMPLANT
WARMER LAPAROSCOPE (MISCELLANEOUS) ×2 IMPLANT
WATER STERILE IRR 1000ML POUR (IV SOLUTION) ×2 IMPLANT

## 2014-06-08 NOTE — Brief Op Note (Signed)
06/08/2014  9:05 AM  PATIENT:  Debbie [name]  33 y.o. female  PRE-OPERATIVE DIAGNOSIS:  DESIRES STERILIZATION  POST-OPERATIVE DIAGNOSIS:  same  PROCEDURE:  Procedure(s): LAPAROSCOPIC TUBAL LIGATION (Bilateral)  SURGEON:  Surgeon(s) and Role:    * Daria Pastures, MD - Primary   ANESTHESIA:   general  EBL:  Total I/O In: 1600 [I.V.:1600] Out: 50 [Urine:50]   SPECIMEN:  No Specimen  DISPOSITION OF SPECIMEN:  N/A  COUNTS:  YES  TOURNIQUET:  * No tourniquets in log *  DICTATION: .Note written in EPIC  PLAN OF CARE: Discharge to home after PACU  PATIENT DISPOSITION:  PACU - hemodynamically stable.   Delay start of Pharmacological VTE agent (>24hrs) due to surgical blood loss or risk of bleeding: not applicable  Meds: none  Complications:  none  Findings: normal tubes, ovaries, uterus and liver edge.  Appendix not seen.  Technique  After adequate general endotracheal anesthesia was achieved, the patient was prepped and draped in the usual sterile fashion.  A 2 cm incision was made just below the umbilicus and the abdominal wall tented up.  The Veress needle was inserted at a 45 degree angle to the pelvis and no bowel contents or blood were aspirated.  The abdomen was insufflated and the 12 mm trocar placed without complication.  The operative scope was introduced and the above findings noted.  The filchie clip instrument was introduced and the tube were followed to their fimbriated ends bilaterally.  A filchie clip was placed on the isthmic portion of each tube and confirmed visually to be around the entire circumference of each tube.  After a quick inspection of the pelvis and liver edge, all instruments were removed and the abdomen was desufflated.  The 12 mm faschial incision was closed with a figure of eight stitch of 2-vicryl and the skin was closed with dermabond.  All instruments were removed from the vagina and the patient returned to the PACU in stable  condition.  Thimothy Barretta A

## 2014-06-08 NOTE — Discharge Instructions (Signed)

## 2014-06-08 NOTE — Progress Notes (Signed)
There has been no change in the patients history, status or exam since the history and physical.  Filed Vitals:   05/25/14 0829 06/08/14 0757  BP:  110/66  Pulse:  71  Temp:  98.2 F (36.8 C)  TempSrc:  Oral  Resp:  18  Height: 5\' 2"  (1.575 m)   Weight: 72.576 kg (160 lb)   SpO2:  100%    Lab Results  Component Value Date   WBC 6.7 06/08/2014   HGB 12.5 06/08/2014   HCT 36.7 06/08/2014   MCV 68.3* 06/08/2014   PLT 291 06/08/2014  UPT neg Pt is sure she desires permanent sterilization.  Scheryl Sanborn A

## 2014-06-08 NOTE — Anesthesia Postprocedure Evaluation (Signed)
  Anesthesia Post Note  Patient: Debbie [name]  Procedure(s) Performed: Procedure(s) (LRB): LAPAROSCOPIC TUBAL LIGATION (Bilateral)  Anesthesia type: GA  Patient location: PACU  Post pain: Pain level controlled  Post assessment: Post-op Vital signs reviewed  Last Vitals:  Filed Vitals:   06/08/14 0930  BP: 105/57  Pulse: 72  Temp:   Resp: 16    Post vital signs: Reviewed  Level of consciousness: sedated  Complications: No apparent anesthesia complications

## 2014-06-08 NOTE — Op Note (Signed)
06/08/2014  9:05 AM  PATIENT:  Debbie [name]  33 y.o. female  PRE-OPERATIVE DIAGNOSIS:  DESIRES STERILIZATION  POST-OPERATIVE DIAGNOSIS:  same  PROCEDURE:  Procedure(s): LAPAROSCOPIC TUBAL LIGATION (Bilateral)  SURGEON:  Surgeon(s) and Role:    * Daria Pastures, MD - Primary   ANESTHESIA:   general  EBL:  Total I/O In: 1600 [I.V.:1600] Out: 50 [Urine:50]   SPECIMEN:  No Specimen  DISPOSITION OF SPECIMEN:  N/A  COUNTS:  YES  TOURNIQUET:  * No tourniquets in log *  DICTATION: .Note written in EPIC  PLAN OF CARE: Discharge to home after PACU  PATIENT DISPOSITION:  PACU - hemodynamically stable.   Delay start of Pharmacological VTE agent (>24hrs) due to surgical blood loss or risk of bleeding: not applicable  Meds: none  Complications:  none  Findings: normal tubes, ovaries, uterus and liver edge.  Appendix not seen.  Technique  After adequate general endotracheal anesthesia was achieved, the patient was prepped and draped in the usual sterile fashion.  A 2 cm incision was made just below the umbilicus and the abdominal wall tented up.  The Veress needle was inserted at a 45 degree angle to the pelvis and no bowel contents or blood were aspirated.  The abdomen was insufflated and the 12 mm trocar placed without complication.  The operative scope was introduced and the above findings noted.  The filchie clip instrument was introduced and the tube were followed to their fimbriated ends bilaterally.  A filchie clip was placed on the isthmic portion of each tube and confirmed visually to be around the entire circumference of each tube.  After a quick inspection of the pelvis and liver edge, all instruments were removed and the abdomen was desufflated.  The 12 mm faschial incision was closed with a figure of eight stitch of 2-vicryl and the skin was closed with dermabond.  All instruments were removed from the vagina and the patient returned to the PACU in stable  condition.  [name] A

## 2014-06-08 NOTE — Anesthesia Procedure Notes (Addendum)
Procedures

## 2014-06-08 NOTE — Anesthesia Preprocedure Evaluation (Addendum)
Anesthesia Evaluation  Patient identified by MRN, date of birth, ID band Patient awake    Reviewed: Allergy & Precautions, H&P , Patient's Chart, lab work & pertinent test results, reviewed documented beta blocker date and time   Airway Mallampati: II TM Distance: >3 FB Neck ROM: full    Dental no notable dental hx.    Pulmonary  breath sounds clear to auscultation  Pulmonary exam normal       Cardiovascular Rhythm:regular Rate:Normal     Neuro/Psych    GI/Hepatic   Endo/Other  Hypothyroidism   Renal/GU      Musculoskeletal   Abdominal   Peds  Hematology   Anesthesia Other Findings   Reproductive/Obstetrics                           Anesthesia Physical Anesthesia Plan  ASA: II  Anesthesia Plan: General   Post-op Pain Management:    Induction: Intravenous  Airway Management Planned: Oral ETT  Additional Equipment:   Intra-op Plan:   Post-operative Plan: Extubation in OR  Informed Consent: I have reviewed the patients History and Physical, chart, labs and discussed the procedure including the risks, benefits and alternatives for the proposed anesthesia with the patient or authorized representative who has indicated his/her understanding and acceptance.   Dental Advisory Given and Dental advisory given  Plan Discussed with: CRNA and Surgeon  Anesthesia Plan Comments: (  Discussed general anesthesia, including possible nausea, instrumentation of airway, sore throat,pulmonary aspiration, etc. I asked if the were any outstanding questions, or  concerns before we proceeded. )        Anesthesia Quick Evaluation

## 2014-06-08 NOTE — Transfer of Care (Signed)
Immediate Anesthesia Transfer of Care Note  Patient: Debbie [name]  Procedure(s) Performed: Procedure(s): LAPAROSCOPIC TUBAL LIGATION (Bilateral)  Patient Location: PACU  Anesthesia Type:General  Level of Consciousness: awake, alert , oriented and patient cooperative  Airway & Oxygen Therapy: Patient Spontanous Breathing and Patient connected to nasal cannula oxygen  Post-op Assessment: Report given to PACU RN and Post -op Vital signs reviewed and stable  Post vital signs: Reviewed and stable  Complications: No apparent anesthesia complications

## 2014-06-09 ENCOUNTER — Encounter (HOSPITAL_COMMUNITY): Payer: Self-pay | Admitting: Obstetrics and Gynecology

## 2014-08-01 ENCOUNTER — Encounter (HOSPITAL_COMMUNITY): Payer: Self-pay | Admitting: Obstetrics and Gynecology

## 2014-10-16 ENCOUNTER — Encounter (HOSPITAL_BASED_OUTPATIENT_CLINIC_OR_DEPARTMENT_OTHER): Payer: Self-pay

## 2014-10-16 ENCOUNTER — Emergency Department (HOSPITAL_BASED_OUTPATIENT_CLINIC_OR_DEPARTMENT_OTHER): Payer: Medicaid Other

## 2014-10-16 ENCOUNTER — Emergency Department (HOSPITAL_BASED_OUTPATIENT_CLINIC_OR_DEPARTMENT_OTHER)
Admission: EM | Admit: 2014-10-16 | Discharge: 2014-10-16 | Disposition: A | Discharge status: Home / Self Care | Payer: Medicaid Other | Attending: Emergency Medicine | Admitting: Emergency Medicine

## 2014-10-16 DIAGNOSIS — Z79899 Other long term (current) drug therapy: Secondary | ICD-10-CM | POA: Insufficient documentation

## 2014-10-16 DIAGNOSIS — Z3202 Encounter for pregnancy test, result negative: Secondary | ICD-10-CM | POA: Insufficient documentation

## 2014-10-16 DIAGNOSIS — R197 Diarrhea, unspecified: Secondary | ICD-10-CM | POA: Insufficient documentation

## 2014-10-16 DIAGNOSIS — N289 Disorder of kidney and ureter, unspecified: Secondary | ICD-10-CM | POA: Insufficient documentation

## 2014-10-16 DIAGNOSIS — Z87440 Personal history of urinary (tract) infections: Secondary | ICD-10-CM | POA: Insufficient documentation

## 2014-10-16 DIAGNOSIS — Z862 Personal history of diseases of the blood and blood-forming organs and certain disorders involving the immune mechanism: Secondary | ICD-10-CM | POA: Diagnosis not present

## 2014-10-16 DIAGNOSIS — E039 Hypothyroidism, unspecified: Secondary | ICD-10-CM | POA: Diagnosis not present

## 2014-10-16 DIAGNOSIS — R109 Unspecified abdominal pain: Secondary | ICD-10-CM

## 2014-10-16 DIAGNOSIS — R1013 Epigastric pain: Secondary | ICD-10-CM

## 2014-10-16 DIAGNOSIS — Z8679 Personal history of other diseases of the circulatory system: Secondary | ICD-10-CM | POA: Diagnosis not present

## 2014-10-16 DIAGNOSIS — R1110 Vomiting, unspecified: Secondary | ICD-10-CM | POA: Diagnosis present

## 2014-10-16 LAB — COMPREHENSIVE METABOLIC PANEL
ALK PHOS: 36 U/L — AB (ref 39–117)
ALT: 13 U/L (ref 0–35)
AST: 17 U/L (ref 0–37)
Albumin: 4.4 g/dL (ref 3.5–5.2)
BILIRUBIN TOTAL: 1 mg/dL (ref 0.3–1.2)
BUN: 12 mg/dL (ref 6–23)
CHLORIDE: 108 meq/L (ref 96–112)
CO2: 25 mmol/L (ref 19–32)
Calcium: 8.4 mg/dL (ref 8.4–10.5)
Creatinine, Ser: 0.72 mg/dL (ref 0.50–1.10)
GFR calc non Af Amer: >90 mL/min (ref >90)
Glucose, Bld: 94 mg/dL (ref 70–99)
Potassium: 4 mmol/L (ref 3.5–5.1)
Sodium: 135 mmol/L (ref 135–145)
Total Protein: 6.5 g/dL (ref 6.0–8.3)

## 2014-10-16 LAB — URINALYSIS, ROUTINE W REFLEX MICROSCOPIC
Bilirubin Urine: NEGATIVE
GLUCOSE, UA: NEGATIVE mg/dL
HGB URINE DIPSTICK: NEGATIVE
Ketones, ur: NEGATIVE mg/dL
LEUKOCYTES UA: NEGATIVE
Nitrite: NEGATIVE
PH: 5.5 (ref 5.0–8.0)
Protein, ur: NEGATIVE mg/dL
SPECIFIC GRAVITY, URINE: 1.022 (ref 1.005–1.030)
Urobilinogen, UA: 0.2 mg/dL (ref 0.0–1.0)

## 2014-10-16 LAB — CBC WITH DIFFERENTIAL/PLATELET
BASOS PCT: 0 % (ref 0–1)
Basophils Absolute: 0 10*3/uL (ref 0.0–0.1)
EOS PCT: 6 % — AB (ref 0–5)
Eosinophils Absolute: 0.3 10*3/uL (ref 0.0–0.7)
HCT: 34.2 % — ABNORMAL LOW (ref 36.0–46.0)
Hemoglobin: 11.7 g/dL — ABNORMAL LOW (ref 12.0–15.0)
LYMPHS ABS: 1.7 10*3/uL (ref 0.7–4.0)
Lymphocytes Relative: 32 % (ref 12–46)
MCH: 22 pg — ABNORMAL LOW (ref 26.0–34.0)
MCHC: 34.2 g/dL (ref 30.0–36.0)
MCV: 64.4 fL — ABNORMAL LOW (ref 78.0–100.0)
MONOS PCT: 12 % (ref 3–12)
Monocytes Absolute: 0.6 10*3/uL (ref 0.1–1.0)
Neutro Abs: 2.6 10*3/uL (ref 1.7–7.7)
Neutrophils Relative %: 50 % (ref 43–77)
PLATELETS: 325 10*3/uL (ref 150–400)
RBC: 5.31 MIL/uL — AB (ref 3.87–5.11)
RDW: 15.7 % — AB (ref 11.5–15.5)
WBC: 5.2 10*3/uL (ref 4.0–10.5)

## 2014-10-16 LAB — LIPASE, BLOOD: LIPASE: 29 U/L (ref 11–59)

## 2014-10-16 LAB — PREGNANCY, URINE: Preg Test, Ur: NEGATIVE

## 2014-10-16 MED ORDER — ONDANSETRON HCL 4 MG/2ML IJ SOLN
4.0000 mg | Freq: Once | INTRAMUSCULAR | Status: AC
  Administered 2014-10-16: 4 mg via INTRAVENOUS
  Filled 2014-10-16: qty 2

## 2014-10-16 MED ORDER — SODIUM CHLORIDE 0.9 % IV SOLN
1000.0000 mL | Freq: Once | INTRAVENOUS | Status: AC
  Administered 2014-10-16: 1000 mL via INTRAVENOUS

## 2014-10-16 MED ORDER — ONDANSETRON HCL 4 MG PO TABS: 4.0000 mg | ORAL_TABLET | Freq: Four times a day (QID) | ORAL | Status: DC

## 2014-10-16 MED ORDER — HYDROCODONE-ACETAMINOPHEN 5-325 MG PO TABS
1.0000 | ORAL_TABLET | ORAL | Status: DC | PRN
Start: 2014-10-16 — End: 2015-02-13

## 2014-10-16 MED ORDER — HYDROMORPHONE HCL 1 MG/ML IJ SOLN
0.5000 mg | INTRAMUSCULAR | Status: AC | PRN
  Administered 2014-10-16 (×3): 0.5 mg via INTRAVENOUS
  Filled 2014-10-16 (×3): qty 1

## 2014-10-16 MED ORDER — ONDANSETRON HCL 4 MG/2ML IJ SOLN
INTRAMUSCULAR | Status: AC
  Administered 2014-10-16: 4 mg
  Filled 2014-10-16: qty 2

## 2014-10-16 MED ORDER — SODIUM CHLORIDE 0.9 % IV SOLN: 1000.0000 mL | INTRAVENOUS | Status: DC

## 2014-10-16 NOTE — ED Provider Notes (Signed)
CSN: [id number]     Arrival date & time 10/16/14  1032 History   First MD Initiated Contact with Patient 10/16/14 1103     Chief Complaint  Patient presents with  . vomiting/diarrhea     Patient is a 34 y.o. female presenting with abdominal pain. The history is provided by the patient.  Abdominal Pain Pain location:  Epigastric Pain quality: sharp   Pain radiates to:  Does not radiate Pain severity:  Moderate Onset quality:  Gradual Duration:  3 days Timing:  Constant Chronicity:  New Context: previous surgery, recent illness and sick contacts (children with recent URI illnesses)   Context: not suspicious food intake   Relieved by:  Nothing Ineffective treatments: ginger tea, immodium, pepto bismol. Associated symptoms: chills, diarrhea and vomiting   Associated symptoms: no fever   Several episodes of vomiting and diarrhea.  Now just dry heaves.  Past Medical History  Diagnosis Date  . Anemia   . Migraines   . UTI (urinary tract infection)   . Migraine   . Thyroid disease   . Varicose veins     Left > than right  . Hypothyroidism   . Vaginal delivery 2002, 2009, 2015   Past Surgical History  Procedure Laterality Date  . Appendectomy    . Tonsillectomy and adenoidectomy    . Tonsillectomy  2001  . Tonsillectomy    . Dilation and evacuation N/A 04/27/2013    Procedure: DILATATION AND EVACUATION;  Surgeon: Daria Pastures, MD;  Location: [facility];  Service: Gynecology;  Laterality: N/A;  . Laparoscopic tubal ligation Bilateral 06/08/2014    Procedure: LAPAROSCOPIC TUBAL LIGATION;  Surgeon: Daria Pastures, MD;  Location: [facility];  Service: Gynecology;  Laterality: Bilateral;   Family History  Problem Relation Age of Onset  . Arthritis Neg Hx     family  . Hypertension Mother   . Hyperlipidemia Father   . Diabetes Father   . Hypertension Father    History  Substance Use Topics  . Smoking status: Never Smoker   . Smokeless tobacco: Never Used  . Alcohol Use:  No   OB History    Gravida Para Term Preterm AB TAB SAB Ectopic Multiple Living   6 3 3  3 1 2   3      Review of Systems  Constitutional: Positive for chills. Negative for fever.  Gastrointestinal: Positive for vomiting, abdominal pain and diarrhea.  All other systems reviewed and are negative.     Allergies  Aspirin; Bactrim; and Sulfa antibiotics  Home Medications   Prior to Admission medications   Medication Sig Start Date End Date Taking? Authorizing Provider  levothyroxine (SYNTHROID, LEVOTHROID) 200 MCG tablet Take 150 mcg by mouth daily before breakfast.    Yes Historical Provider, MD  HYDROcodone-acetaminophen (NORCO/VICODIN) 5-325 MG per tablet Take 1-2 tablets by mouth every 4 (four) hours as needed. 10/16/14   Dorie Rank, MD  ondansetron (ZOFRAN) 4 MG tablet Take 1 tablet (4 mg total) by mouth every 6 (six) hours. 10/16/14   Dorie Rank, MD   BP 101/54 mmHg  Pulse 63  Temp(Src) 98.8 F (37.1 C)  Resp 18  Ht 5\' 2"  (1.575 m)  Wt 163 lb (73.936 kg)  BMI 29.81 kg/m2  SpO2 100%  LMP 09/20/2014 Physical Exam  Constitutional: She appears well-developed and well-nourished. No distress.  HENT:  Head: Normocephalic and atraumatic.  Right Ear: External ear normal.  Left Ear: External ear normal.  Eyes: Conjunctivae are normal.  Right eye exhibits no discharge. Left eye exhibits no discharge. No scleral icterus.  Neck: Neck supple. No tracheal deviation present.  Cardiovascular: Normal rate, regular rhythm and intact distal pulses.   Pulmonary/Chest: Effort normal and breath sounds normal. No stridor. No respiratory distress. She has no wheezes. She has no rales.  Abdominal: Soft. Bowel sounds are normal. She exhibits no distension. There is tenderness in the epigastric area. There is guarding. There is no rigidity and no rebound. No hernia.  Musculoskeletal: She exhibits no edema or tenderness.  Neurological: She is alert. She has normal strength. No cranial nerve deficit  (no facial droop, extraocular movements intact, no slurred speech) or sensory deficit. She exhibits normal muscle tone. She displays no seizure activity. Coordination normal.  Skin: Skin is warm and dry. No rash noted.  Psychiatric: She has a normal mood and affect.  Nursing note and vitals reviewed.   ED Course  Procedures (including critical care time) Labs Review Labs Reviewed  CBC WITH DIFFERENTIAL - Abnormal; Notable for the following:    RBC 5.31 (*)    Hemoglobin 11.7 (*)    HCT 34.2 (*)    MCV 64.4 (*)    MCH 22.0 (*)    RDW 15.7 (*)    Eosinophils Relative 6 (*)    All other components within normal limits  COMPREHENSIVE METABOLIC PANEL - Abnormal; Notable for the following:    Alkaline Phosphatase 36 (*)    Anion gap <3 (*)    All other components within normal limits  URINALYSIS, ROUTINE W REFLEX MICROSCOPIC  PREGNANCY, URINE  LIPASE, BLOOD    Imaging Review US Abdomen Complete  10/16/2014   CLINICAL DATA:  34 year old female with epigastric pain radiating to the umbilicus with nausea vomiting diarrhea x1 day. Night sweats. Initial encounter. Personal history of appendectomy.  EXAM: ULTRASOUND ABDOMEN COMPLETE  COMPARISON:  Ob ultrasound 03/11/2007.  FINDINGS: Gallbladder: No gallstones or wall thickening visualized. No sonographic Murphy sign noted.  Common bile duct: Diameter: 1-2 mm, normal  Liver: No focal lesion identified. Within normal limits in parenchymal echogenicity.  IVC: No abnormality visualized.  Pancreas: Visualized portion unremarkable.  Spleen: Size and appearance within normal limits.  Right Kidney: Length: 11.7 cm. Echogenicity within normal limits. No mass or hydronephrosis visualized.  Left Kidney: Length: 11.4 cm. Small mixed echogenicity area at the midpole corticomedullary junction 8-10 mm (image 74). No evidence of vascularity or solid components on brief color Doppler (image 78). Echogenicity within normal limits. No mass or hydronephrosis  visualized.  Abdominal aorta: No aneurysm visualized.  Other findings: None.  IMPRESSION: 1. Normal gallbladder and no acute abdominal findings. 2. Small 8-10 mm mildly complex lesion at the left renal midpole, compatible with Bosniak 57F type lesion. Strongly favor benign etiology such as partially calcified benign cyst (or perhaps just nonobstructing nephrolithiasis). Recommend a six - month follow-up abdomen MRI without and with contrast (renal protocol). This recommendation adapted from ACR consensus guidelines: Managing Incidental Findings on Abdominal CT: White Paper of the ACR Incidental Findings Committee. Joellyn Rued Radiol [phone]   Electronically Signed   By: Lars Pinks M.D.   On: 10/16/2014 13:41   Dg Abd Acute W/chest  10/16/2014   CLINICAL DATA:  Abdominal pain for 2 days.  Nausea.  EXAM: ACUTE ABDOMEN SERIES (ABDOMEN 2 VIEW & CHEST 1 VIEW)  COMPARISON:  PA and lateral chest 04/28/2012.  FINDINGS: Single view of the chest demonstrates clear lungs and normal heart size. No pneumothorax or pleural  effusion.  Two views of the abdomen show no free intraperitoneal air. The bowel gas pattern is nonobstructive. Tubal ligation clips are noted. No abnormal abdominal calcification is seen.  IMPRESSION: No acute abnormality chest or abdomen.   Electronically Signed   By: Inge Rise M.D.   On: 10/16/2014 14:42   Medications  0.9 %  sodium chloride infusion (1,000 mLs Intravenous New Bag/Given 10/16/14 1141)    Followed by  0.9 %  sodium chloride infusion (not administered)  ondansetron (ZOFRAN) injection 4 mg (4 mg Intravenous Given 10/16/14 1140)  HYDROmorphone (DILAUDID) injection 0.5 mg (0.5 mg Intravenous Given 10/16/14 1436)  ondansetron (ZOFRAN) 4 MG/2ML injection (4 mg  Given 10/16/14 1334)     MDM   Final diagnoses:  Epigastric abdominal pain  Abdominal pain, acute    [place] does not show gallbladder abnormality.  AAS does not suggest obstruction.   Labs unremarkable.  Will dc home  with medications for nausea, pain.    At this time there does not appear to be any evidence of an acute emergency medical condition and the patient appears stable for discharge with appropriate outpatient follow up.     Dorie Rank, MD 10/16/14 [phone]

## 2014-10-16 NOTE — Discharge Instructions (Signed)

## 2014-10-16 NOTE — ED Notes (Signed)
Patient transported to X-ray 

## 2014-10-16 NOTE — ED Notes (Signed)
Patient here with vomiting and diarrhea that she reports started on Friday afternoon. Reports that the diarrhea is more frequent than the vomiting and complains of sharp irritating discomfort to top of abdomen.

## 2015-02-08 ENCOUNTER — Emergency Department (HOSPITAL_BASED_OUTPATIENT_CLINIC_OR_DEPARTMENT_OTHER)
Admission: EM | Admit: 2015-02-08 | Discharge: 2015-02-08 | Disposition: A | Discharge status: Home / Self Care | Payer: Medicaid Other | Attending: Emergency Medicine | Admitting: Emergency Medicine

## 2015-02-08 ENCOUNTER — Encounter (HOSPITAL_BASED_OUTPATIENT_CLINIC_OR_DEPARTMENT_OTHER): Payer: Self-pay | Admitting: *Deleted

## 2015-02-08 DIAGNOSIS — G4489 Other headache syndrome: Secondary | ICD-10-CM | POA: Diagnosis not present

## 2015-02-08 DIAGNOSIS — Z79899 Other long term (current) drug therapy: Secondary | ICD-10-CM | POA: Diagnosis not present

## 2015-02-08 DIAGNOSIS — R51 Headache: Secondary | ICD-10-CM | POA: Diagnosis present

## 2015-02-08 DIAGNOSIS — Z87440 Personal history of urinary (tract) infections: Secondary | ICD-10-CM | POA: Insufficient documentation

## 2015-02-08 DIAGNOSIS — E039 Hypothyroidism, unspecified: Secondary | ICD-10-CM | POA: Diagnosis not present

## 2015-02-08 DIAGNOSIS — Z8679 Personal history of other diseases of the circulatory system: Secondary | ICD-10-CM | POA: Insufficient documentation

## 2015-02-08 DIAGNOSIS — Z862 Personal history of diseases of the blood and blood-forming organs and certain disorders involving the immune mechanism: Secondary | ICD-10-CM | POA: Diagnosis not present

## 2015-02-08 MED ORDER — DIPHENHYDRAMINE HCL 50 MG/ML IJ SOLN
25.0000 mg | Freq: Once | INTRAMUSCULAR | Status: AC
  Administered 2015-02-08: 25 mg via INTRAMUSCULAR

## 2015-02-08 MED ORDER — METOCLOPRAMIDE HCL 5 MG/ML IJ SOLN
10.0000 mg | Freq: Once | INTRAMUSCULAR | Status: AC
  Administered 2015-02-08: 10 mg via INTRAVENOUS
  Filled 2015-02-08: qty 2

## 2015-02-08 MED ORDER — DIPHENHYDRAMINE HCL 50 MG/ML IJ SOLN
25.0000 mg | Freq: Once | INTRAMUSCULAR | Status: DC
  Filled 2015-02-08: qty 1

## 2015-02-08 NOTE — ED Notes (Signed)
Pt c/o ha x 1 day  States has had 3-4 ha per week x 1 month  Nausea but denies vomiting,  States ibu 800 every 6 hours has helped with other ha

## 2015-02-08 NOTE — ED Notes (Addendum)
Pt c/o " migraine" x 1 month

## 2015-02-08 NOTE — Discharge Instructions (Signed)

## 2015-02-08 NOTE — ED Provider Notes (Signed)
CSN: [id number]     Arrival date & time 02/08/15  1920 History   First MD Initiated Contact with Patient 02/08/15 1940     This chart was scribed for Ripley Fraise, MD by Forrestine Him, ED Scribe. This patient was seen in room MH07/MH07 and the patient's care was started 7:49 PM.   Chief Complaint  Patient presents with  . Migraine   Patient is a 34 y.o. female presenting with migraines. The history is provided by the patient. No language interpreter was used.  Migraine This is a recurrent problem. The current episode started more than 1 week ago. The problem occurs constantly. The problem has been gradually worsening. Associated symptoms include headaches. Pertinent negatives include no chest pain, no abdominal pain and no shortness of breath. Nothing relieves the symptoms.    HPI Comments: Debbie [name] is a 34 y.o. female with a PMHx of anemia, migraines, and thyroid disease who presents to the Emergency Department complaining of intermittent, ongoing HA x 1 month, constant and progressively worsened in last 24 hours. she admits to 4-5 episodes of HAs per week. Pt also reports nausea, dizziness, and pain that radiates down the back of her neck; all associated symptoms being new for her with typical HAs.  She has tried prescribed Ibuprofen 800 mg without any improvement for symptoms. No recent fever, chills, vomiting, visual changes, chest pain, or abdominal pain. No weakness or numbness to arms or legs. Pt with known allergies to ASA, Bactrim, and Sulfa Antibiotics. No travel No trauma  Past Medical History  Diagnosis Date  . Anemia   . Migraines   . UTI (urinary tract infection)   . Migraine   . Thyroid disease   . Varicose veins     Left > than right  . Hypothyroidism   . Vaginal delivery 2002, 2009, 2015   Past Surgical History  Procedure Laterality Date  . Appendectomy    . Tonsillectomy and adenoidectomy    . Tonsillectomy  2001  . Tonsillectomy    . Dilation and evacuation  N/A 04/27/2013    Procedure: DILATATION AND EVACUATION;  Surgeon: Daria Pastures, MD;  Location: [facility];  Service: Gynecology;  Laterality: N/A;  . Laparoscopic tubal ligation Bilateral 06/08/2014    Procedure: LAPAROSCOPIC TUBAL LIGATION;  Surgeon: Daria Pastures, MD;  Location: [facility];  Service: Gynecology;  Laterality: Bilateral;   Family History  Problem Relation Age of Onset  . Arthritis Neg Hx     family  . Hypertension Mother   . Hyperlipidemia Father   . Diabetes Father   . Hypertension Father    History  Substance Use Topics  . Smoking status: Never Smoker   . Smokeless tobacco: Never Used  . Alcohol Use: No   OB History    Gravida Para Term Preterm AB TAB SAB Ectopic Multiple Living   6 3 3  3 1 2   3      Review of Systems  Constitutional: Negative for fever and chills.  Respiratory: Negative for shortness of breath.   Cardiovascular: Negative for chest pain.  Gastrointestinal: Positive for nausea. Negative for vomiting and abdominal pain.  Musculoskeletal: Positive for neck pain.  Skin: Negative for rash.  Neurological: Positive for dizziness and headaches.  Psychiatric/Behavioral: Negative for confusion.  All other systems reviewed and are negative.     Allergies  Aspirin; Bactrim; and Sulfa antibiotics  Home Medications   Prior to Admission medications   Medication Sig Start Date End Date  Taking? Authorizing Provider  HYDROcodone-acetaminophen (NORCO/VICODIN) 5-325 MG per tablet Take 1-2 tablets by mouth every 4 (four) hours as needed. 10/16/14   Dorie Rank, MD  levothyroxine (SYNTHROID, LEVOTHROID) 200 MCG tablet Take 150 mcg by mouth daily before breakfast.     Historical Provider, MD  ondansetron (ZOFRAN) 4 MG tablet Take 1 tablet (4 mg total) by mouth every 6 (six) hours. 10/16/14   Dorie Rank, MD   Triage Vitals: BP 107/63 mmHg  Pulse 72  Temp(Src) 98.3 F (36.8 C) (Oral)  Resp 18  Ht 5\' 2"  (1.575 m)  Wt 161 lb (73.029 kg)  BMI 29.44 kg/m2   SpO2 100%  LMP 02/06/2015   Physical Exam  CONSTITUTIONAL: Well developed/well nourished HEAD: Normocephalic/atraumatic EYES: EOMI/PERRL, no nystagmus, no ptosis ENMT: Mucous membranes moist NECK: supple no meningeal signs, no bruits SPINE/BACK:entire spine, cervical paraspinal tenderness CV: S1/S2 noted, no murmurs/rubs/gallops noted LUNGS: Lungs are clear to auscultation bilaterally, no apparent distress ABDOMEN: soft, nontender, no rebound or guarding GU:no cva tenderness NEURO:Awake/alert, facies symmetric, no arm or leg drift is noted Equal 5/5 strength with shoulder abduction, elbow flex/extension, wrist flex/extension in upper extremities and equal hand grips bilaterally Equal 5/5 strength with hip flexion,knee flex/extension, foot dorsi/plantar flexion Cranial nerves 3/4/5/6/04/07/09/11/12 tested and intact Gait normal without ataxia No past pointing Sensation to light touch intact in all extremities EXTREMITIES: pulses normal, full ROM SKIN: warm, color normal PSYCH: no abnormalities of mood noted, alert and oriented to situation   ED Course  Procedures   DIAGNOSTIC STUDIES: Oxygen Saturation is 100% on RA, Normal by my interpretation.    COORDINATION OF CARE: 8:13 PM- Will give Reglan and Benadryl here in ED. Advised pt to follow up with the Headache clinic or with her PCP fur further management. Discussed treatment plan with pt at bedside and pt agreed to plan.    9:03 PM Pt improved She is well appearing Advised f/u for migraine management    Medications  metoCLOPramide (REGLAN) injection 10 mg (10 mg Intravenous Given 02/08/15 2023)  diphenhydrAMINE (BENADRYL) injection 25 mg (25 mg Intramuscular Given 02/08/15 2033)     MDM   Final diagnoses:  Other headache syndrome    Nursing notes including past medical history and social history reviewed and considered in documentation   I personally performed the services described in this documentation, which  was scribed in my presence. The recorded information has been reviewed and is accurate.      Ripley Fraise, MD 02/08/15 2103

## 2015-02-13 ENCOUNTER — Encounter: Payer: Self-pay | Admitting: Neurology

## 2015-02-13 ENCOUNTER — Ambulatory Visit (INDEPENDENT_AMBULATORY_CARE_PROVIDER_SITE_OTHER): Payer: Medicaid Other | Admitting: Neurology

## 2015-02-13 VITALS — BP 110/74 | HR 69 | Ht 62.0 in | Wt 169.0 lb

## 2015-02-13 DIAGNOSIS — G43709 Chronic migraine without aura, not intractable, without status migrainosus: Secondary | ICD-10-CM | POA: Diagnosis not present

## 2015-02-13 MED ORDER — CYCLOBENZAPRINE HCL 5 MG PO TABS: 5.0000 mg | ORAL_TABLET | Freq: Three times a day (TID) | ORAL | Status: DC | PRN

## 2015-02-13 MED ORDER — TOPIRAMATE 25 MG PO TABS: 25.0000 mg | ORAL_TABLET | Freq: Two times a day (BID) | ORAL | Status: DC

## 2015-02-13 NOTE — Progress Notes (Signed)
[facility]    Provider:  Dr Jaynee Eagles Referring Provider: No ref. provider found Primary Care Physician:  Chevis Pretty, MD  CC:  Migraines  HPI:  Debbie [name] is a 34 y.o. female here as a referral from the hospital for Headaches. She has migraines for years since teens. She is having her migraines 3-4 times a week. She has throbbing on the right side, whole right side to the neck. She has light sensitivity, sound sensitivity, nausea. Has vomited. A dark room helps. She gets dizziness with the headaches. No vision or hearing changes. No other focal neurologic symptoms. No aura. She has been taking OTC medications which don' t help. She is taking too many motrins. Migraines can last all day and worsening. Can get to be 10/10 pain. She has had tubal ligation. No known triggers.   Review of Systems: Patient complains of symptoms per HPI as well as the following symptoms: Feeling cold, allergies, headache, weakness, dizziness. Pertinent negatives per HPI. All others negative.   History   Social History  . Marital Status: Single    Spouse Name: N/A  . Number of Children: 3  . Years of Education: College    Occupational History  . Not on file.   Social History Main Topics  . Smoking status: Never Smoker   . Smokeless tobacco: Never Used  . Alcohol Use: 0.0 oz/week    0 Standard drinks or equivalent per week     Comment: Socially   . Drug Use: No  . Sexual Activity: Yes    Birth Control/ Protection: None   Other Topics Concern  . Not on file   Social History Narrative   Lives at home with husband, kids.   Caffeine use: Drinks 1 cup coffee per day       Family History  Problem Relation Age of Onset  . Arthritis Neg Hx     family  . Hypertension Mother   . Hyperlipidemia Father   . Diabetes Father   . Hypertension Father     Past Medical History  Diagnosis Date  . Anemia   . Migraines   . UTI (urinary tract infection)   . Migraine   . Thyroid  disease   . Varicose veins     Left > than right  . Hypothyroidism   . Vaginal delivery 2002, 2009, 2015    Past Surgical History  Procedure Laterality Date  . Appendectomy    . Tonsillectomy and adenoidectomy    . Tonsillectomy  2001  . Tonsillectomy    . Dilation and evacuation N/A 04/27/2013    Procedure: DILATATION AND EVACUATION;  Surgeon: Daria Pastures, MD;  Location: [facility];  Service: Gynecology;  Laterality: N/A;  . Laparoscopic tubal ligation Bilateral 06/08/2014    Procedure: LAPAROSCOPIC TUBAL LIGATION;  Surgeon: Daria Pastures, MD;  Location: [facility];  Service: Gynecology;  Laterality: Bilateral;    Current Outpatient Prescriptions  Medication Sig Dispense Refill  . levothyroxine (SYNTHROID, LEVOTHROID) 200 MCG tablet Take 150 mcg by mouth daily before breakfast.     . cyclobenzaprine (FLEXERIL) 5 MG tablet Take 1 tablet (5 mg total) by mouth every 8 (eight) hours as needed for muscle spasms. 60 tablet 11  . SUMAtriptan (IMITREX) 50 MG tablet Take 50 mg by mouth as needed.  0  . topiramate (TOPAMAX) 25 MG tablet Take 1 tablet (25 mg total) by mouth 2 (two) times daily. 60 tablet 11   No current facility-administered medications for  this visit.    Allergies as of 02/13/2015 - Review Complete 02/13/2015  Allergen Reaction Noted  . Aspirin Hives 02/18/2011  . Bactrim Swelling and Rash 10/24/2011  . Sulfa antibiotics Swelling and Rash 12/20/2011    Vitals: BP 110/74 mmHg  Pulse 69  Ht 5\' 2"  (1.575 m)  Wt 169 lb (76.658 kg)  BMI 30.90 kg/m2  LMP 02/06/2015 Last Weight:  Wt Readings from Last 1 Encounters:  02/13/15 169 lb (76.658 kg)   Last Height:   Ht Readings from Last 1 Encounters:  02/13/15 5\' 2"  (1.575 m)   Physical exam: Exam: Gen: NAD, conversant, well nourised, well groomed                     CV: RRR, no MRG. No Carotid Bruits. No peripheral edema, warm, nontender Eyes: Conjunctivae clear without exudates or hemorrhage  Neuro: Detailed  Neurologic Exam  Speech:    Speech is normal; fluent and spontaneous with normal comprehension.  Cognition:    The patient is oriented to person, place, and time;     recent and remote memory intact;     language fluent;     normal attention, concentration,     fund of knowledge Cranial Nerves:    The pupils are equal, round, and reactive to light. The fundi are normal and spontaneous venous pulsations are present. Visual fields are full to finger confrontation. Extraocular movements are intact. Trigeminal sensation is intact and the muscles of mastication are normal. The face is symmetric. The palate elevates in the midline. Hearing intact. Voice is normal. Shoulder shrug is normal. The tongue has normal motion without fasciculations.   Coordination:    Normal finger to nose and heel to shin. Normal rapid alternating movements.   Gait:    Heel-toe and tandem gait are normal.   Motor Observation:    No asymmetry, no atrophy, and no involuntary movements noted. Tone:    Normal muscle tone.    Posture:    Posture is normal. normal erect    Strength:    Strength is V/V in the upper and lower limbs.      Sensation: intact to LT     Reflex Exam:  DTR's:    Deep tendon reflexes in the upper and lower extremities are normal bilaterally.   Toes:    The toes are downgoing bilaterally.   Clonus:    Clonus is absent.       Assessment/Plan:  35 year old patient with chronic migraines, without aura, not intractable  Will start Topamax  Flexeril at onset (imitrex and nsaids failed, allergy to asa)  Sarina Ill, MD  [facility] [address] [place], [place] [address]  Phone [phone] Fax [phone]

## 2015-02-13 NOTE — Patient Instructions (Addendum)
Overall you are doing fairly well but I do want to suggest a few things today:   Remember to drink plenty of fluid, eat healthy meals and do not skip any meals. Try to eat protein with a every meal and eat a healthy snack such as fruit or nuts in between meals. Try to keep a regular sleep-wake schedule and try to exercise daily, particularly in the form of walking, 20-30 minutes a day, if you can.   As far as your medications are concerned, I would like to suggest: Topiramate 25mg  twice daily Flexeril at onset of headache  I would like to see you back in 3 months, sooner if we need to. Please call us with any interim questions, concerns, problems, updates or refill requests.   Please also call us for any test results so we can go over those with you on the phone.  My clinical assistant and will answer any of your questions and relay your messages to me and also relay most of my messages to you.   Our phone number is [phone]. We also have an after hours call service for urgent matters and there is a physician on-call for urgent questions. For any emergencies you know to call 911 or go to the nearest emergency room  To prevent or relieve headaches, try the following: Cool Compress. Lie down and place a cool compress on your head.  Avoid headache triggers. If certain foods or odors seem to have triggered your migraines in the past, avoid them. A headache diary might help you identify triggers.  Include physical activity in your daily routine. Try a daily walk or other moderate aerobic exercise.  Manage stress. Find healthy ways to cope with the stressors, such as delegating tasks on your to-do list.  Practice relaxation techniques. Try deep breathing, yoga, massage and visualization.  Eat regularly. Eating regularly scheduled meals and maintaining a healthy diet might help prevent headaches. Also, drink plenty of fluids.  Follow a regular sleep schedule. Sleep deprivation might contribute to  headaches Consider biofeedback. With this mind-body technique, you learn to control certain bodily functions - such as muscle tension, heart rate and blood pressure - to prevent headaches or reduce headache pain.    Proceed to emergency room if you experience new or worsening symptoms or symptoms do not resolve, if you have new neurologic symptoms or if headache is severe, or for any concerning symptom.

## 2015-05-16 ENCOUNTER — Ambulatory Visit: Payer: Medicaid Other | Admitting: Neurology

## 2015-05-23 ENCOUNTER — Encounter: Payer: Self-pay | Admitting: Neurology

## 2015-05-23 ENCOUNTER — Ambulatory Visit (INDEPENDENT_AMBULATORY_CARE_PROVIDER_SITE_OTHER): Payer: Medicaid Other | Admitting: Neurology

## 2015-05-23 VITALS — BP 108/62 | HR 70 | Ht 62.0 in | Wt 166.4 lb

## 2015-05-23 DIAGNOSIS — G43009 Migraine without aura, not intractable, without status migrainosus: Secondary | ICD-10-CM | POA: Diagnosis not present

## 2015-05-23 MED ORDER — KETOROLAC TROMETHAMINE 60 MG/2ML IM SOLN
60.0000 mg | Freq: Once | INTRAMUSCULAR | Status: AC
  Administered 2015-05-23: 60 mg via INTRAMUSCULAR

## 2015-05-23 MED ORDER — NORTRIPTYLINE HCL 10 MG PO CAPS: 20.0000 mg | ORAL_CAPSULE | Freq: Every day | ORAL | Status: DC

## 2015-05-23 MED ORDER — ONDANSETRON 4 MG PO TBDP: 4.0000 mg | ORAL_TABLET | Freq: Three times a day (TID) | ORAL | Status: DC | PRN

## 2015-05-23 MED ORDER — ELETRIPTAN HYDROBROMIDE 40 MG PO TABS: 40.0000 mg | ORAL_TABLET | ORAL | Status: DC | PRN

## 2015-05-23 NOTE — Patient Instructions (Signed)
Overall you are doing fairly well but I do want to suggest a few things today:   Remember to drink plenty of fluid, eat healthy meals and do not skip any meals. Try to eat protein with a every meal and eat a healthy snack such as fruit or nuts in between meals. Try to keep a regular sleep-wake schedule and try to exercise daily, particularly in the form of walking, 20-30 minutes a day, if you can.   As far as your medications are concerned, I would like to suggest:   Nortriptyline at night 10mg  (one pill) in 2 weeks increase to 2 pills At the onset of headache, take Relpax. Can repeat it in 2 hours. Don't take it more than 2x a day or 2 days a week. If nauseated, take zofran  I would like to see you back in 3 months, sooner if we need to. Please call us with any interim questions, concerns, problems, updates or refill requests.   Please also call us for any test results so we can go over those with you on the phone.  My clinical assistant and will answer any of your questions and relay your messages to me and also relay most of my messages to you.   Our phone number is [phone]. We also have an after hours call service for urgent matters and there is a physician on-call for urgent questions. For any emergencies you know to call 911 or go to the nearest emergency room

## 2015-05-23 NOTE — Progress Notes (Signed)
Gave 60mg /78ml in left deltoid. Used alcohol prep before injection. No reaction. Pt stable upon leaving office.

## 2015-05-23 NOTE — Progress Notes (Signed)
[facility]    Provider: Dr Jaynee Eagles Referring Provider: No ref. provider found Primary Care Physician: Chevis Pretty, MD  CC: Migraines  Interval history: She is having headaches several times a week. headaches last all day. Flexeril helped but makes her too sleepy. She stopped Topamax due to intolerance. Discussed that we can try different medication, will try Nortriptyline at night and zofran for nausea. Relpax for acute management with zofran for nausea.   HPI: Debbie [name] is a 34 y.o. female here as a referral from the hospital for Headaches. She has migraines for years since teens. She is having her migraines 3-4 times a week. She has throbbing on the right side, whole right side to the neck. She has light sensitivity, sound sensitivity, nausea. Has vomited. A dark room helps. She gets dizziness with the headaches. No vision or hearing changes. No other focal neurologic symptoms. No aura. She has been taking OTC medications which don' t help. She is taking too many motrins. Migraines can last all day and worsening. Can get to be 10/10 pain. She has had tubal ligation. No known triggers.   Review of Systems: Patient complains of symptoms per HPI as well as the following symptoms: coled intolerance, diarrhea, memory loss, dizziness, headache. Pertinent negatives per HPI. All others negative.   Social History   Social History  . Marital Status: Single    Spouse Name: N/A  . Number of Children: 3  . Years of Education: College    Occupational History  . Not on file.   Social History Main Topics  . Smoking status: Never Smoker   . Smokeless tobacco: Never Used  . Alcohol Use: 0.0 oz/week    0 Standard drinks or equivalent per week     Comment: Socially   . Drug Use: No  . Sexual Activity: Yes    Birth Control/ Protection: None   Other Topics Concern  . Not on file   Social History Narrative   Lives at home with husband, kids.   Caffeine use:  Drinks 1 cup coffee per day       Family History  Problem Relation Age of Onset  . Arthritis Neg Hx     family  . Migraines Neg Hx   . Hypertension Mother   . Hyperlipidemia Father   . Diabetes Father   . Hypertension Father     Past Medical History  Diagnosis Date  . Anemia   . Migraines   . UTI (urinary tract infection)   . Migraine   . Thyroid disease   . Varicose veins     Left > than right  . Hypothyroidism   . Vaginal delivery 2002, 2009, 2015    Past Surgical History  Procedure Laterality Date  . Appendectomy    . Tonsillectomy and adenoidectomy    . Tonsillectomy  2001  . Tonsillectomy    . Dilation and evacuation N/A 04/27/2013    Procedure: DILATATION AND EVACUATION;  Surgeon: Daria Pastures, MD;  Location: [facility];  Service: Gynecology;  Laterality: N/A;  . Laparoscopic tubal ligation Bilateral 06/08/2014    Procedure: LAPAROSCOPIC TUBAL LIGATION;  Surgeon: Daria Pastures, MD;  Location: [facility];  Service: Gynecology;  Laterality: Bilateral;    Current Outpatient Prescriptions  Medication Sig Dispense Refill  . cyclobenzaprine (FLEXERIL) 5 MG tablet Take 1 tablet (5 mg total) by mouth every 8 (eight) hours as needed for muscle spasms. 60 tablet 11  . levothyroxine (SYNTHROID, LEVOTHROID) 200 MCG  tablet Take 150 mcg by mouth daily before breakfast.     . SUMAtriptan (IMITREX) 50 MG tablet Take 50 mg by mouth as needed.  0  . topiramate (TOPAMAX) 25 MG tablet Take 1 tablet (25 mg total) by mouth 2 (two) times daily. 60 tablet 11   No current facility-administered medications for this visit.    Allergies as of 05/23/2015 - Review Complete 02/13/2015  Allergen Reaction Noted  . Aspirin Hives 02/18/2011  . Bactrim Swelling and Rash 10/24/2011  . Sulfa antibiotics Swelling and Rash 12/20/2011    Vitals: There were no vitals taken for this visit. Last Weight:  Wt Readings from Last 1 Encounters:  02/13/15 169 lb (76.658 kg)   Last Height:   Ht  Readings from Last 1 Encounters:  02/13/15 5\' 2"  (1.575 m)    Cranial Nerves:  The pupils are equal, round, and reactive to light. The fundi are normal and spontaneous venous pulsations are present. Visual fields are full to finger confrontation. Extraocular movements are intact. Trigeminal sensation is intact and the muscles of mastication are normal. The face is symmetric. The palate elevates in the midline. Hearing intact. Voice is normal. Shoulder shrug is normal. The tongue has normal motion without fasciculations.   Coordination:  Normal finger to nose and heel to shin. Normal rapid alternating movements.   Gait:  Heel-toe and tandem gait are normal.   Motor Observation:  No asymmetry, no atrophy, and no involuntary movements noted. Tone:  Normal muscle tone.   Posture:  Posture is normal. normal erect   Strength:  Strength is V/V in the upper and lower limbs.    Sensation: intact to LT   Reflex Exam:  DTR's:  Deep tendon reflexes in the upper and lower extremities are normal bilaterally.  Toes:  The toes are downgoing bilaterally.  Clonus:  Clonus is absent.      Assessment/Plan: 34 year old patient with chronic migraines, without aura, not intractable  As far as your medications are concerned, I would like to suggest:   Nortriptyline at night 10mg  (one pill) in 2 weeks increase to 2 pills At the onset of headache, take Relpax. Can repeat it in 2 hours. Don't take it more than 2x a day or 2 days a week. If nauseated, take zofran   Sarina Ill, MD  [facility] [address] [place],  [address]  Phone [phone] Fax [phone]  A total of 15 minutes was spent face-to-face with this patient. Over half this time was spent on counseling patient on the migraine diagnosis and different diagnostic and therapeutic options available.

## 2015-06-09 ENCOUNTER — Other Ambulatory Visit: Payer: Self-pay | Admitting: Neurology

## 2015-06-09 ENCOUNTER — Telehealth: Payer: Self-pay | Admitting: Neurology

## 2015-06-09 MED ORDER — TOPIRAMATE 25 MG PO TABS: 25.0000 mg | ORAL_TABLET | Freq: Two times a day (BID) | ORAL | Status: DC

## 2015-06-09 NOTE — Telephone Encounter (Signed)
Patient is calling. She states she needs to stop taking medication nortriptyline (PAMELOR) 10 MG capsule because it does not help. The patient would like to start taking Topamax again. Please call Topamax to [facility] on [address] in [place]. Thank you.

## 2015-06-09 NOTE — Telephone Encounter (Signed)
Called in Topamax 25mg  twice dail. We can increase as needed and tolerated. Please ask her not to breastfeed or become pregnant while taking this drug. thanks

## 2015-06-09 NOTE — Telephone Encounter (Signed)
I called back.  Got no answer.  Left message relaying provides note.

## 2015-06-12 ENCOUNTER — Encounter: Payer: Self-pay | Admitting: Neurology

## 2015-06-12 ENCOUNTER — Ambulatory Visit (INDEPENDENT_AMBULATORY_CARE_PROVIDER_SITE_OTHER): Payer: Medicaid Other | Admitting: Neurology

## 2015-06-12 VITALS — BP 110/72 | HR 70 | Ht 62.0 in | Wt 169.0 lb

## 2015-06-12 DIAGNOSIS — H538 Other visual disturbances: Secondary | ICD-10-CM

## 2015-06-12 DIAGNOSIS — G441 Vascular headache, not elsewhere classified: Secondary | ICD-10-CM

## 2015-06-12 DIAGNOSIS — R42 Dizziness and giddiness: Secondary | ICD-10-CM | POA: Diagnosis not present

## 2015-06-12 MED ORDER — TOPIRAMATE ER 50 MG PO CAP24: 50.0000 mg | ORAL_CAPSULE | Freq: Every day | ORAL | Status: DC

## 2015-06-12 MED ORDER — KETOROLAC TROMETHAMINE 60 MG/2ML IM SOLN
60.0000 mg | Freq: Once | INTRAMUSCULAR | Status: AC
  Administered 2015-06-12: 60 mg via INTRAMUSCULAR

## 2015-06-12 NOTE — Progress Notes (Signed)
Gave 60mg /76mL Toradol in left deltoid. Cleaned with alcohol prep before injection. Pt stable upon leaving office.

## 2015-06-12 NOTE — Patient Instructions (Signed)
Overall you are doing fairly well but I do want to suggest a few things today:   Remember to drink plenty of fluid, eat healthy meals and do not skip any meals. Try to eat protein with a every meal and eat a healthy snack such as fruit or nuts in between meals. Try to keep a regular sleep-wake schedule and try to exercise daily, particularly in the form of walking, 20-30 minutes a day, if you can.   As far as your medications are concerned, I would like to suggest; Start Trokendi as discussed  As far as diagnostic testing: MRi of thebrain  I would like to see you back in 4 weeks, sooner if we need to. Please call us with any interim questions, concerns, problems, updates or refill requests.   Please also call us for any test results so we can go over those with you on the phone.  My clinical assistant and will answer any of your questions and relay your messages to me and also relay most of my messages to you.   Our phone number is [phone]. We also have an after hours call service for urgent matters and there is a physician on-call for urgent questions. For any emergencies you know to call 911 or go to the nearest emergency room

## 2015-06-12 NOTE — Progress Notes (Signed)
[facility]    Provider: Dr Jaynee Eagles Referring Provider: No ref. provider found Primary Care Physician: Chevis Pretty, MD  CC: Migraines  Interval update 06/12/2015: Headaches are getting worse. 2-3x a day. Will start Topamax. She is worried that something is wrong. She has blurry vision, vision changes. They are severe. The Nortriptyline didn't help at all. She wants to go back on Topamax. Discussed Trokendi, maybe she will tolerate it better. It is taken at night. She had to sleep with a cool rag over her head last night due to the migraine pain. She is also having dizziness.   Interval history: She is having headaches several times a week. headaches last all day. Flexeril helped but makes her too sleepy. She stopped Topamax due to intolerance. Discussed that we can try different medication, will try Nortriptyline at night and zofran for nausea. Relpax for acute management with zofran for nausea.   HPI: Debbie [name] is a 34 y.o. female here as a referral from the hospital for Headaches. She has migraines for years since teens. She is having her migraines 3-4 times a week. She has throbbing on the right side, whole right side to the neck. She has light sensitivity, sound sensitivity, nausea. Has vomited. A dark room helps. She gets dizziness with the headaches. No vision or hearing changes. No other focal neurologic symptoms. No aura. She has been taking OTC medications which don' t help. She is taking too many motrins. Migraines can last all day and worsening. Can get to be 10/10 pain. She has had tubal ligation. No known triggers.   Review of Systems: Patient complains of symptoms per HPI as well as the following symptoms: coled intolerance, diarrhea, memory loss, dizziness, headache. Pertinent negatives per HPI. All others negative.  Social History   Social History  . Marital Status: Single    Spouse Name: N/A  . Number of Children: 3  . Years of Education:  College    Occupational History  . Not on file.   Social History Main Topics  . Smoking status: Never Smoker   . Smokeless tobacco: Never Used  . Alcohol Use: 0.0 oz/week    0 Standard drinks or equivalent per week     Comment: Socially   . Drug Use: No  . Sexual Activity: Yes    Birth Control/ Protection: None   Other Topics Concern  . Not on file   Social History Narrative   Lives at home with husband, kids.   Caffeine use: Drinks 1 cup coffee per day       Family History  Problem Relation Age of Onset  . Arthritis Neg Hx     family  . Migraines Neg Hx   . Hypertension Mother   . Hyperlipidemia Father   . Diabetes Father   . Hypertension Father     Past Medical History  Diagnosis Date  . Anemia   . Migraines   . UTI (urinary tract infection)   . Migraine   . Thyroid disease   . Varicose veins     Left > than right  . Hypothyroidism   . Vaginal delivery 2002, 2009, 2015    Past Surgical History  Procedure Laterality Date  . Appendectomy    . Tonsillectomy and adenoidectomy    . Tonsillectomy  2001  . Tonsillectomy    . Dilation and evacuation N/A 04/27/2013    Procedure: DILATATION AND EVACUATION;  Surgeon: Daria Pastures, MD;  Location: [facility];  Service:  Gynecology;  Laterality: N/A;  . Laparoscopic tubal ligation Bilateral 06/08/2014    Procedure: LAPAROSCOPIC TUBAL LIGATION;  Surgeon: Daria Pastures, MD;  Location: [facility];  Service: Gynecology;  Laterality: Bilateral;    Current Outpatient Prescriptions  Medication Sig Dispense Refill  . cyclobenzaprine (FLEXERIL) 5 MG tablet Take 1 tablet (5 mg total) by mouth every 8 (eight) hours as needed for muscle spasms. 60 tablet 11  . eletriptan (RELPAX) 40 MG tablet Take 1 tablet (40 mg total) by mouth as needed for migraine or headache. May repeat in 2 hours if headache persists or recurs. 10 tablet 11  . ondansetron (ZOFRAN ODT) 4 MG disintegrating tablet Take 1 tablet (4 mg total) by mouth every 8  (eight) hours as needed for nausea or vomiting. 20 tablet 6  . pantoprazole (PROTONIX) 40 MG tablet Take 40 mg by mouth as needed.  0  . SYNTHROID 175 MCG tablet Take 175 mcg by mouth daily.  2  . nortriptyline (PAMELOR) 10 MG capsule Take 2 capsules (20 mg total) by mouth at bedtime. (Patient not taking: Reported on 06/12/2015) 30 capsule 3  . topiramate (TOPAMAX) 25 MG tablet Take 1 tablet (25 mg total) by mouth 2 (two) times daily. (Patient not taking: Reported on 06/12/2015) 60 tablet 6   No current facility-administered medications for this visit.    Allergies as of 06/12/2015 - Review Complete 06/12/2015  Allergen Reaction Noted  . Aspirin Hives 02/18/2011  . Bactrim Swelling and Rash 10/24/2011  . Sulfa antibiotics Swelling and Rash 12/20/2011    Vitals: BP 110/72 mmHg  Pulse 70  Ht 5\' 2"  (1.575 m)  Wt 169 lb (76.658 kg)  BMI 30.90 kg/m2  SpO2 99% Last Weight:  Wt Readings from Last 1 Encounters:  06/12/15 169 lb (76.658 kg)   Last Height:   Ht Readings from Last 1 Encounters:  06/12/15 5\' 2"  (1.575 m)     Cranial Nerves:  The pupils are equal, round, and reactive to light. The fundi are normal and spontaneous venous pulsations are present. Visual fields are full to finger confrontation. Extraocular movements are intact. Trigeminal sensation is intact and the muscles of mastication are normal. The face is symmetric. The palate elevates in the midline. Hearing intact. Voice is normal. Shoulder shrug is normal. The tongue has normal motion without fasciculations.   Coordination:  Normal finger to nose and heel to shin. Normal rapid alternating movements.   Gait:  Heel-toe and tandem gait are normal.   Motor Observation:  No asymmetry, no atrophy, and no involuntary movements noted. Tone:  Normal muscle tone.   Posture:  Posture is normal. normal erect   Strength:  Strength is V/V in the upper and lower limbs.    Sensation: intact to LT    Reflex Exam:  DTR's:  Deep tendon reflexes in the upper and lower extremities are normal bilaterally.  Toes:  The toes are downgoing bilaterally.  Clonus:  Clonus is absent.      Assessment/Plan: 34 year old patient with chronic migraines, without aura, not intractable  As far as your medications are concerned, I would like to suggest:   Trokendi. She has had tubal ligation. Common side effects include Common side effects of Topamax include: paresthesia, nausea, ataxia, lack of concentration, diplopia, nervousness, dizziness, memory impairment, speech disturbance, anxiety, dysphasia, drowsiness, psychomotor disturbance, confusion, fatigue, diarrhea, visual disturbance, weight loss, depression, dysgeusia, mood changes but stop for anything concerning especially abdominal pain as nephrolithiasis is possible. At the  onset of headache, take Relpax. Can repeat it in 2 hours. Don't take it more than 2x a day or 2 days a week. If nauseated, take zofran MRI of the brain Will follow up sooner this time in 4 weeks   Sarina Ill, MD  [facility] [address] [place], [place] [address]  Phone [phone] Fax [phone]  A total of 25 minutes was spent face-to-face with this patient. Over half this time was spent on counseling patient on the migraine diagnosis and different diagnostic and therapeutic options available.

## 2015-06-13 LAB — THYROID PANEL WITH TSH
Free Thyroxine Index: 3.1 (ref 1.2–4.9)
T3 UPTAKE RATIO: 33 % (ref 24–39)
T4, Total: 9.3 ug/dL (ref 4.5–12.0)
TSH: 0.579 u[IU]/mL (ref 0.450–4.500)

## 2015-06-13 LAB — COMPREHENSIVE METABOLIC PANEL
ALBUMIN: 4.3 g/dL (ref 3.5–5.5)
ALK PHOS: 38 IU/L — AB (ref 39–117)
ALT: 9 IU/L (ref 0–32)
AST: 15 IU/L (ref 0–40)
Albumin/Globulin Ratio: 2 (ref 1.1–2.5)
BUN / CREAT RATIO: 16 (ref 8–20)
BUN: 14 mg/dL (ref 6–20)
Bilirubin Total: 0.6 mg/dL (ref 0.0–1.2)
CHLORIDE: 105 mmol/L (ref 97–108)
CO2: 25 mmol/L (ref 18–29)
Calcium: 8.8 mg/dL (ref 8.7–10.2)
Creatinine, Ser: 0.89 mg/dL (ref 0.57–1.00)
GFR calc Af Amer: 98 mL/min/{1.73_m2} (ref >59)
GFR calc non Af Amer: 85 mL/min/{1.73_m2} (ref >59)
GLUCOSE: 77 mg/dL (ref 65–99)
Globulin, Total: 2.1 g/dL (ref 1.5–4.5)
Potassium: 5.1 mmol/L (ref 3.5–5.2)
Sodium: 140 mmol/L (ref 134–144)
Total Protein: 6.4 g/dL (ref 6.0–8.5)

## 2015-06-13 LAB — CBC
Hematocrit: 38.1 % (ref 34.0–46.6)
Hemoglobin: 11.8 g/dL (ref 11.1–15.9)
MCH: 22.2 pg — AB (ref 26.6–33.0)
MCHC: 31 g/dL — AB (ref 31.5–35.7)
MCV: 72 fL — ABNORMAL LOW (ref 79–97)
Platelets: 329 10*3/uL (ref 150–379)
RBC: 5.32 x10E6/uL — ABNORMAL HIGH (ref 3.77–5.28)
RDW: 18.5 % — AB (ref 12.3–15.4)
WBC: 6.6 10*3/uL (ref 3.4–10.8)

## 2015-06-13 LAB — MAGNESIUM: Magnesium: 1.9 mg/dL (ref 1.6–2.3)

## 2015-06-14 ENCOUNTER — Telehealth: Payer: Self-pay

## 2015-06-14 NOTE — Telephone Encounter (Signed)
-----   Message from Melvenia Beam, MD sent at 06/14/2015  1:11 PM EDT ----- Let patient know her labs showed a mild iron deficiency. She should take a multivitamin with iron in it thanks.

## 2015-06-14 NOTE — Telephone Encounter (Signed)
Called and left message on cell phone [phone]) (per DPR) informing her that Dr. Jaynee Eagles said she has mild iron deficiency and to please start taking a multivitamin with iron in it. I asked pt to call back with any questions or concerns.

## 2015-07-03 ENCOUNTER — Ambulatory Visit: Payer: Medicaid Other | Admitting: Neurology

## 2015-07-03 ENCOUNTER — Telehealth: Payer: Self-pay

## 2015-07-03 DIAGNOSIS — R42 Dizziness and giddiness: Secondary | ICD-10-CM

## 2015-07-03 DIAGNOSIS — G441 Vascular headache, not elsewhere classified: Secondary | ICD-10-CM

## 2015-07-03 DIAGNOSIS — H538 Other visual disturbances: Secondary | ICD-10-CM

## 2015-07-03 NOTE — Telephone Encounter (Signed)
LVM for pt to call back about rx request for topamax. Gave [facility] phone number.

## 2015-07-03 NOTE — Telephone Encounter (Signed)
Patient was rescheduled. Dr. Jaynee Eagles gave her samples at last visit for Topamax  Worked really well for her and she would like RX called in.

## 2015-07-03 NOTE — Telephone Encounter (Signed)
Called patient and left her a message stating we would have to Cx her apt with Dr. Jaynee Eagles and get her rescheduled. If patient call's back please schedule her apt with Dr. Jaynee Eagles.

## 2015-07-03 NOTE — Telephone Encounter (Signed)
Debbie [name] - this is fine. Please ask her what dose she ended up titrating to  and prescribe it for her. Give her 30 days with 11 refllls. Thanks!

## 2015-07-04 MED ORDER — TOPIRAMATE ER 50 MG PO CAP24: 50.0000 mg | ORAL_CAPSULE | Freq: Every day | ORAL | Status: DC

## 2015-07-04 NOTE — Telephone Encounter (Signed)
Called pt back. She is taking 50mg  once at night of topamax. I told her I will send Rx to her pharmacy. I verified pharmacy was correct in her chart. Told her to call if she notices any side effects. She verbalized understanding.

## 2015-07-12 ENCOUNTER — Telehealth: Payer: Self-pay | Admitting: Neurology

## 2015-07-12 NOTE — Telephone Encounter (Signed)
Debbie [name], would you let patient know? We can try Zonisamide which is a similar medication and if that doesn't work then hopefully we can go back to trokendi. Let us know thanks!

## 2015-07-12 NOTE — Telephone Encounter (Signed)
Called pt to let her know Dr. Jaynee Eagles is more than happy to give her samples of Trokendi until she can see if it is possible to get it. Since she failed topiramate so we may be able to appeal to her insurance. We are going to see if Janett Billow can look into this. Pt is going to pick up samples in office tomorrow. Told her to bring a valid photo ID with her. She verbalized understanding.

## 2015-07-12 NOTE — Telephone Encounter (Signed)
Pt called and needs prior auth on Topiramate ER (TROKENDI XR) 50 MG CP24. Thank you . Pt request call to update her on the status. [phone]

## 2015-07-12 NOTE — Telephone Encounter (Signed)
Emma please call and let her know we are more than happy to give her samples of Trokendi until she can see if it is possible to get it. She failed Topiramate so we may be able to appeal to her insurance. Janett Billow, what do you think?

## 2015-07-12 NOTE — Telephone Encounter (Signed)
I contacted Medicaid.  They do not cover Trokendi for any diagnosis other than Seizure Disorder, as any other condition is considered "off label use".  The only way they will reconsider is if we can provide documentation of trial and failure of Gabapentin, Lamotrigine, Levetiracetam or Zonisamide.  Please advise.  Thank you.

## 2015-07-12 NOTE — Telephone Encounter (Signed)
If insurance won't pay for trokendi, We can start her on generic then. Can you explain generic medication is the same it is just twice a day? If acceptable to patient, will you call in 25mg  bid topiramate and dispense 60 with 6 refills? Would you mind Janett Billow?

## 2015-07-12 NOTE — Telephone Encounter (Signed)
Patient called to request samples of Topiramate ER (TROKENDI XR) 50 MG CP24  if not covered by insurance because it really worked for her and now that she is off of the medication the headaches are coming back.

## 2015-07-12 NOTE — Telephone Encounter (Signed)
I called the patient back. Relayed providers message.  Patient says she actually has a bottle of the Topiramate 25mg  bid on hand, and does not want another Rx for it at this time.  Says she does not feel it was as effective when she tried it before, however, she might try to take what she has. Said she may just pay for Trokendi out of pocket if needed.  There is a discount card available online at [email or web address].  It is not valid if the pharmacy tries to bill her ins since she has Medicaid, they have to bill the voucher only.  This has a max benefit amount of $250 per month.  Patient states she will look into this and call us back if anything further is needed.

## 2015-07-13 ENCOUNTER — Ambulatory Visit: Payer: Self-pay | Admitting: Neurology

## 2015-07-13 ENCOUNTER — Other Ambulatory Visit: Payer: Self-pay

## 2015-07-13 MED ORDER — ZONISAMIDE 100 MG PO CAPS: 100.0000 mg | ORAL_CAPSULE | Freq: Every day | ORAL | Status: DC

## 2015-07-13 NOTE — Telephone Encounter (Signed)
Thank yoU !!! 

## 2015-07-13 NOTE — Telephone Encounter (Signed)
Thank you! 100mg  Zonisamide qhs dispense 30 with 6 refills. Tell her Terrence Dupont left samples of Trokendi for her at the front desk. In a few weeks if the Zonisamide doesn't work can we try to get Trokendi approved again?

## 2015-07-13 NOTE — Telephone Encounter (Signed)
Rx has been added and sent.  I spoke with the patient.  She expressed understanding and appreciation.  She will call us back with an update in a couple weeks.  We can definitely try to get Trokendi approved if she fails Zonisamide!

## 2015-07-13 NOTE — Telephone Encounter (Signed)
I called back and spoke with the patient.  She is agreeable to trying Zonisamide.  Says we can call in a Rx and she will try it for a couple weeks and call us back with an update.  I will be happy to send Rx if you could please verify what dose/directions you prefer.  (It's avail in 25mg , 50mg  and 100mg  capsules)  Thank you!

## 2015-08-22 ENCOUNTER — Telehealth: Payer: Self-pay | Admitting: Neurology

## 2015-08-22 NOTE — Telephone Encounter (Signed)
Called and spoke to [employer]. She is going to get rx zonisamide ready for pt pick-up per pt request. Called pt and pt is aware. Explained to take 1 tablet at night (100mg ). Call if she experiences any side effects.

## 2015-08-22 NOTE — Telephone Encounter (Signed)
The patient is calling and states that the doctor was sending in a medication to replace the Rx Topiramate and her pharmacy has not received.  Please call.

## 2015-08-22 NOTE — Telephone Encounter (Signed)
thanks

## 2015-08-22 NOTE — Telephone Encounter (Signed)
Called pt back. She advised she never picked up zonisamide rx prescribed back on 10/13. She had enough samples to take. She called pharmacy and the only rx they had was topamax, which is not what she wanted. I advised I will call her pharmacy and see if rx is there and call her back. She verbalized understanding.

## 2015-10-02 ENCOUNTER — Encounter (HOSPITAL_BASED_OUTPATIENT_CLINIC_OR_DEPARTMENT_OTHER): Payer: Self-pay | Admitting: *Deleted

## 2015-10-02 ENCOUNTER — Emergency Department (HOSPITAL_BASED_OUTPATIENT_CLINIC_OR_DEPARTMENT_OTHER): Payer: Medicaid Other

## 2015-10-02 ENCOUNTER — Emergency Department (HOSPITAL_BASED_OUTPATIENT_CLINIC_OR_DEPARTMENT_OTHER)
Admission: EM | Admit: 2015-10-02 | Discharge: 2015-10-02 | Disposition: A | Discharge status: Home / Self Care | Payer: Medicaid Other | Attending: Emergency Medicine | Admitting: Emergency Medicine

## 2015-10-02 DIAGNOSIS — R21 Rash and other nonspecific skin eruption: Secondary | ICD-10-CM

## 2015-10-02 DIAGNOSIS — L539 Erythematous condition, unspecified: Secondary | ICD-10-CM | POA: Insufficient documentation

## 2015-10-02 DIAGNOSIS — H578 Other specified disorders of eye and adnexa: Secondary | ICD-10-CM | POA: Diagnosis not present

## 2015-10-02 DIAGNOSIS — E039 Hypothyroidism, unspecified: Secondary | ICD-10-CM | POA: Diagnosis not present

## 2015-10-02 DIAGNOSIS — R197 Diarrhea, unspecified: Secondary | ICD-10-CM | POA: Insufficient documentation

## 2015-10-02 DIAGNOSIS — J029 Acute pharyngitis, unspecified: Secondary | ICD-10-CM | POA: Insufficient documentation

## 2015-10-02 DIAGNOSIS — Z87440 Personal history of urinary (tract) infections: Secondary | ICD-10-CM | POA: Insufficient documentation

## 2015-10-02 DIAGNOSIS — Z79899 Other long term (current) drug therapy: Secondary | ICD-10-CM | POA: Insufficient documentation

## 2015-10-02 DIAGNOSIS — G43909 Migraine, unspecified, not intractable, without status migrainosus: Secondary | ICD-10-CM | POA: Insufficient documentation

## 2015-10-02 DIAGNOSIS — Z862 Personal history of diseases of the blood and blood-forming organs and certain disorders involving the immune mechanism: Secondary | ICD-10-CM | POA: Diagnosis not present

## 2015-10-02 LAB — CBC
HEMATOCRIT: 35 % — AB (ref 36.0–46.0)
HEMOGLOBIN: 12.1 g/dL (ref 12.0–15.0)
MCH: 22.2 pg — ABNORMAL LOW (ref 26.0–34.0)
MCHC: 34.6 g/dL (ref 30.0–36.0)
MCV: 64.2 fL — ABNORMAL LOW (ref 78.0–100.0)
Platelets: 284 10*3/uL (ref 150–400)
RBC: 5.45 MIL/uL — ABNORMAL HIGH (ref 3.87–5.11)
RDW: 16 % — ABNORMAL HIGH (ref 11.5–15.5)
WBC: 7.9 10*3/uL (ref 4.0–10.5)

## 2015-10-02 LAB — I-STAT CG4 LACTIC ACID, ED: Lactic Acid, Venous: 0.86 mmol/L (ref 0.5–2.0)

## 2015-10-02 LAB — RAPID STREP SCREEN (MED CTR MEBANE ONLY): STREPTOCOCCUS, GROUP A SCREEN (DIRECT): NEGATIVE

## 2015-10-02 MED ORDER — DIPHENHYDRAMINE HCL 25 MG PO CAPS
25.0000 mg | ORAL_CAPSULE | Freq: Once | ORAL | Status: AC
  Administered 2015-10-02: 25 mg via ORAL
  Filled 2015-10-02: qty 1

## 2015-10-02 MED ORDER — SODIUM CHLORIDE 0.9 % IV BOLUS (SEPSIS)
1000.0000 mL | Freq: Once | INTRAVENOUS | Status: AC
  Administered 2015-10-02: 1000 mL via INTRAVENOUS

## 2015-10-02 MED ORDER — ACETAMINOPHEN 325 MG PO TABS
650.0000 mg | ORAL_TABLET | Freq: Once | ORAL | Status: AC
  Administered 2015-10-02: 650 mg via ORAL
  Filled 2015-10-02: qty 2

## 2015-10-02 NOTE — Discharge Instructions (Signed)
Drug Rash A drug rash is a change in the color or texture of the skin that is caused by a drug. It can develop minutes, hours, or days after the person takes the drug. CAUSES This condition is usually caused by a drug allergy. It can also be caused by exposure to sunlight after taking a drug that makes the skin sensitive to light. Drugs that commonly cause rashes include:  Penicillin.  Antibiotic medicines.  Medicines that treat seizures.  Medicines that treat cancer (chemotherapy).  Aspirin and other nonsteroidal anti-inflammatory drugs (NSAIDs).  Injectable dyes that contain iodine.  Insulin. SYMPTOMS Symptoms of this condition include:  Redness.  Tiny bumps.  Peeling.  Itching.  Itchy welts (hives).  Swelling. The rash may appear on a small area of skin or all over the body. DIAGNOSIS To diagnose the condition, your health care provider will do a physical exam. He or she may also order tests to find out which drug caused the rash. Tests to find the cause of a rash include:  Skin tests.  Blood tests.  Drug challenge. For this test, you stop taking all of the drugs that you do not need to take, and then you start taking them again by adding back one of the drugs at a time. TREATMENT A drug rash may be treated with medicines, including:  Antihistamines. These may be given to relieve itching.  An NSAID. This may be given to reduce swelling and treat pain.  A steroid drug. This may be given to reduce swelling. The rash usually goes away when the person stops taking the drug that caused it. HOME CARE INSTRUCTIONS  Take medicines only as directed by your health care provider.  Let all of your health care providers know about any drug reactions you have had in the past.  If you have hives, take a cool shower or use a cool compress to relieve itchiness. SEEK MEDICAL CARE IF:  You have a fever.  Your rash is not going away.  Your rash gets worse.  Your rash  comes back.  You have wheezing or coughing. SEEK IMMEDIATE MEDICAL CARE IF:  You start to have breathing problems.  You start to have shortness of breath.  You face or throat starts to swell.  You have severe weakness with dizziness or fainting.  You have chest pain.   This information is not intended to replace advice given to you by your health care provider. Make sure you discuss any questions you have with your health care provider.   Document Released: 10/24/2004 Document Revised: 10/07/2014 Document Reviewed: 07/13/2014 Elsevier Interactive Patient Education 2016 [employer] Pharyngitis Pharyngitis is a sore throat (pharynx). There is redness, pain, and swelling of your throat. HOME CARE   Drink enough fluids to keep your pee (urine) clear or pale yellow.  Only take medicine as told by your doctor.  You may get sick again if you do not take medicine as told. Finish your medicines, even if you start to feel better.  Do not take aspirin.  Rest.  Rinse your mouth (gargle) with salt water ( tsp of salt per 1 qt of water) every 1-2 hours. This will help the pain.  If you are not at risk for choking, you can suck on hard candy or sore throat lozenges. GET HELP IF:  You have large, tender lumps on your neck.  You have a rash.  You cough up green, yellow-brown, or bloody spit. GET HELP RIGHT AWAY IF:  You have a stiff neck.  You drool or cannot swallow liquids.  You throw up (vomit) or are not able to keep medicine or liquids down.  You have very bad pain that does not go away with medicine.  You have problems breathing (not from a stuffy nose). MAKE SURE YOU:   Understand these instructions.  Will watch your condition.  Will get help right away if you are not doing well or get worse.   This information is not intended to replace advice given to you by your health care provider. Make sure you discuss any questions you have with your health care  provider.   Document Released: 03/04/2008 Document Revised: 07/07/2013 Document Reviewed: 05/24/2013 Elsevier Interactive Patient Education [employer].

## 2015-10-02 NOTE — ED Notes (Signed)
She has had a rash ever since taking unknown antibiotic she brought from [place] yesterday. She took the medication for a sore throat. She has been taking Benadryl.

## 2015-10-02 NOTE — ED Provider Notes (Signed)
CSN: [id number]     Arrival date & time 10/02/15  1441 History  By signing my name below, I, Debbie [name], attest that this documentation has been prepared under the direction and in the presence of [name], MD. Electronically Signed: Helane [name], ED Scribe. 10/02/2015. 3:31 PM.    Chief Complaint  Patient presents with  . Allergic Reaction   The history is provided by the patient. No language interpreter was used.   HPI Comments: Debbie [name] is a 35 y.o. female with a PMHx of hypothyroidism and anemia who presents to the Emergency Department complaining of an allergic reaction onset 1 day ago. Pt states she has been feeling achy since 12/24. She reports associated diffuse rash, sore throat, productive cough, watery diarrhea 2x/day, and bilateral eye discharge. She notes exacerbation of the sore throat with swallowing, but states she is able to drink fluids. She notes her entire household has had a stomach bug and URI. She notes she was in the [place] and returned on [id number]. She states she had sinus infection while on vacation and was prescribed antibiotics, but does not recall which kind. She states she did not take the antibiotics until she began to feel worse, which happened 2 days ago. She notes she took the medication 1x/day starting 2 days ago, but did not develop the rash until yesterday, after her second dose.  She states she has taken tylenol (last dose received in the ED) and benadryl (last dose 2 hours ago) for this with mild relief. She reports a PMHx of thyroid disease, for which she is on  Synthroid. She notes she has had a PSHx of tubal ligation. Pt denies n/v.  Pt is allergic to aspirin, bactrim, and sulfa-antibiotics.   Past Medical History  Diagnosis Date  . Anemia   . Migraines   . UTI (urinary tract infection)   . Migraine   . Thyroid disease   . Varicose veins     Left > than right  . Hypothyroidism   . Vaginal delivery 2002, 2009, 2015   Past  Surgical History  Procedure Laterality Date  . Appendectomy    . Tonsillectomy and adenoidectomy    . Tonsillectomy  2001  . Tonsillectomy    . Dilation and evacuation N/A 04/27/2013    Procedure: DILATATION AND EVACUATION;  Surgeon: Daria Pastures, MD;  Location: [facility];  Service: Gynecology;  Laterality: N/A;  . Laparoscopic tubal ligation Bilateral 06/08/2014    Procedure: LAPAROSCOPIC TUBAL LIGATION;  Surgeon: Daria Pastures, MD;  Location: [facility];  Service: Gynecology;  Laterality: Bilateral;   Family History  Problem Relation Age of Onset  . Arthritis Neg Hx     family  . Migraines Neg Hx   . Hypertension Mother   . Hyperlipidemia Father   . Diabetes Father   . Hypertension Father    Social History  Substance Use Topics  . Smoking status: Never Smoker   . Smokeless tobacco: Never Used  . Alcohol Use: 0.0 oz/week    0 Standard drinks or equivalent per week     Comment: Socially    OB History    Gravida Para Term Preterm AB TAB SAB Ectopic Multiple Living   6 3 3  3 1 2   3      Review of Systems  HENT: Positive for sore throat.   Eyes: Positive for discharge.  Respiratory: Positive for cough.   Gastrointestinal: Positive for diarrhea. Negative for nausea and vomiting.  Skin: Positive for rash.  All other systems reviewed and are negative.   Allergies  Aspirin; Bactrim; and Sulfa antibiotics  Home Medications   Prior to Admission medications   Medication Sig Start Date End Date Taking? Authorizing Provider  cyclobenzaprine (FLEXERIL) 5 MG tablet Take 1 tablet (5 mg total) by mouth every 8 (eight) hours as needed for muscle spasms. 02/13/15   Melvenia Beam, MD  eletriptan (RELPAX) 40 MG tablet Take 1 tablet (40 mg total) by mouth as needed for migraine or headache. May repeat in 2 hours if headache persists or recurs. 05/23/15   Melvenia Beam, MD  nortriptyline (PAMELOR) 10 MG capsule Take 2 capsules (20 mg total) by mouth at bedtime. Patient not taking:  Reported on 06/12/2015 05/23/15   Melvenia Beam, MD  ondansetron (ZOFRAN ODT) 4 MG disintegrating tablet Take 1 tablet (4 mg total) by mouth every 8 (eight) hours as needed for nausea or vomiting. 05/23/15   Melvenia Beam, MD  pantoprazole (PROTONIX) 40 MG tablet Take 40 mg by mouth as needed. 06/01/15   Historical Provider, MD  SYNTHROID 175 MCG tablet Take 175 mcg by mouth daily. 04/28/15   Historical Provider, MD  Topiramate ER (TROKENDI XR) 50 MG CP24 Take 50 mg by mouth at bedtime. 07/04/15   Melvenia Beam, MD  zonisamide (ZONEGRAN) 100 MG capsule Take 1 capsule (100 mg total) by mouth at bedtime. 07/13/15   Melvenia Beam, MD   BP 95/58 mmHg  Pulse 94  Temp(Src) 99.8 F (37.7 C) (Oral)  Resp 18  Ht 5\' 2"  (1.575 m)  Wt 160 lb (72.576 kg)  BMI 29.26 kg/m2  SpO2 99%  LMP 10/01/2015 Physical Exam  Constitutional: She is oriented to person, place, and time. She appears well-developed and well-nourished.  HENT:  Head: Normocephalic and atraumatic.  Right Ear: External ear normal.  Left Ear: External ear normal.  Nose: Nose normal.  Mouth/Throat: Oropharyngeal exudate present.  Slightly red oropharynx with minimal exudate  Eyes: Conjunctivae and EOM are normal. Pupils are equal, round, and reactive to light.  Neck: Normal range of motion. Neck supple. No JVD present. No tracheal deviation present. No thyromegaly present.  Cardiovascular: Normal rate, regular rhythm, normal heart sounds and intact distal pulses.   Pulmonary/Chest: Effort normal and breath sounds normal. She has no wheezes.  Abdominal: Soft. Bowel sounds are normal. She exhibits no mass. There is no tenderness. There is no guarding.  Musculoskeletal: Normal range of motion.  Lymphadenopathy:    She has no cervical adenopathy.  Neurological: She is alert and oriented to person, place, and time. She has normal reflexes. No cranial nerve deficit or sensory deficit. Gait normal. GCS eye subscore is 4. GCS verbal subscore  is 5. GCS motor subscore is 6.  Reflex Scores:      Bicep reflexes are 2+ on the right side and 2+ on the left side.      Patellar reflexes are 2+ on the right side and 2+ on the left side. Strength is normal and equal throughout. Cranial nerves grossly intact. Patient fluent. No gross ataxia and patient able to ambulate without difficulty.  Skin: Skin is warm and dry. Rash noted. There is erythema.  Diffuse macro papular rash  Psychiatric: She has a normal mood and affect. Her behavior is normal. Judgment and thought content normal.  Nursing note and vitals reviewed.   ED Course  Procedures  DIAGNOSTIC STUDIES: Oxygen Saturation is 100% on RA, normal by  my interpretation.    COORDINATION OF CARE: 3:29 PM - Discussed plans to order a strep test. Pt advised of plan for treatment and pt agrees.  Labs Review Labs Reviewed  CBC - Abnormal; Notable for the following:    RBC 5.45 (*)    HCT 35.0 (*)    MCV 64.2 (*)    MCH 22.2 (*)    RDW 16.0 (*)    All other components within normal limits  RAPID STREP SCREEN (NOT AT [facility])  CULTURE, GROUP A STREP  I-STAT CG4 LACTIC ACID, ED    Imaging Review Dg Chest 2 View  10/02/2015  CLINICAL DATA:  Patient complains of hives, sore throat and fever. Symptoms for 3 days. EXAM: CHEST  2 VIEW COMPARISON:  10/06/2014. FINDINGS: The heart size and mediastinal contours are within normal limits. Both lungs are clear. The visualized skeletal structures are unremarkable. IMPRESSION: No active cardiopulmonary disease. No change from prior normal study. Electronically Signed   By: Staci Righter M.D.   On: 10/02/2015 16:19   I have personally reviewed and evaluated these images and lab results as part of my medical decision-making.  MDM   Final diagnoses:  Viral pharyngitis  Rash  35 y.o. Female with pharyngitis, fever, and rash.  She took an unkown antibiotic prior to rash so unclear if part of viral syndrome or allergic reaction.  No s/s of airway  or gi involvement. Advise discontinuing abx and continuing antipyretics and benadryl.   I personally performed the services described in this documentation, which was scribed in my presence. The recorded information has been reviewed and considered.   [name], MD 10/02/15 2238

## 2015-10-02 NOTE — ED Notes (Signed)
Dr Jeanell Sparrow aware of vs.

## 2015-10-04 LAB — CULTURE, GROUP A STREP: STREP A CULTURE: NEGATIVE

## 2015-10-05 DIAGNOSIS — R7401 Elevation of levels of liver transaminase levels: Secondary | ICD-10-CM | POA: Insufficient documentation

## 2015-12-13 ENCOUNTER — Telehealth: Payer: Self-pay | Admitting: Neurology

## 2015-12-13 DIAGNOSIS — G441 Vascular headache, not elsewhere classified: Secondary | ICD-10-CM

## 2015-12-13 DIAGNOSIS — H538 Other visual disturbances: Secondary | ICD-10-CM

## 2015-12-13 DIAGNOSIS — R42 Dizziness and giddiness: Secondary | ICD-10-CM

## 2015-12-13 MED ORDER — TOPIRAMATE ER 50 MG PO CAP24: 50.0000 mg | ORAL_CAPSULE | Freq: Every day | ORAL | Status: DC

## 2015-12-13 NOTE — Telephone Encounter (Signed)
Called and spoke to pt. Relayed message that Dr Jaynee Eagles will try and send in rx Trokendi to her pharmacy again to see if insurance will cover it. She verbalized understanding.  Verified pt pharmacy.

## 2015-12-13 NOTE — Telephone Encounter (Signed)
Called and LVM for pt to call back. Please let her know we can try Trokendi again to see if insurance will cover it this time. Please verify her pharmacy. We will send in rx.

## 2015-12-13 NOTE — Telephone Encounter (Signed)
Per Dr Jaynee Eagles- ok to reorder Trokendi and see if insurance will cover this now since she failed zonisamide as well

## 2015-12-13 NOTE — Telephone Encounter (Signed)
Pt called sts HA's have been intermittent for about 1 week. She is waking during the night and in the morning with HA's. She said the HA's are more intense than the last she had. She is wanting to try a previous medication that she was given samples that she took a couple months ago but was told her insurance would not pay for it (she does not remember the name).

## 2016-02-01 DIAGNOSIS — R768 Other specified abnormal immunological findings in serum: Secondary | ICD-10-CM | POA: Insufficient documentation

## 2016-03-13 ENCOUNTER — Ambulatory Visit: Payer: Medicaid Other | Attending: Rheumatology | Admitting: Physical Therapy

## 2016-03-13 DIAGNOSIS — R29898 Other symptoms and signs involving the musculoskeletal system: Secondary | ICD-10-CM | POA: Diagnosis present

## 2016-03-13 DIAGNOSIS — M791 Myalgia, unspecified site: Secondary | ICD-10-CM

## 2016-03-13 DIAGNOSIS — M6281 Muscle weakness (generalized): Secondary | ICD-10-CM | POA: Diagnosis present

## 2016-03-13 NOTE — Therapy (Signed)
[facility] [address]  [place], [place], [address] Phone: [phone]   Fax:  [phone]  Physical Therapy Evaluation  Patient Details  Name: Debbie [name] MRN: [id number] Date of Birth: [birth year] Referring Provider: Gavin Pound, MD  Encounter Date: 03/13/2016      PT End of Session - 03/13/16 1048    Visit Number 1   Number of Visits 4   Date for PT Re-Evaluation 05/08/16   Authorization Type Medicaid   PT Start Time [id number]   PT Stop Time 0935   PT Time Calculation (min) 48 min   Activity Tolerance Patient tolerated treatment well   Behavior During Therapy [facility] for tasks assessed/performed      Past Medical History  Diagnosis Date  . Anemia   . Migraines   . UTI (urinary tract infection)   . Migraine   . Thyroid disease   . Varicose veins     Left > than right  . Hypothyroidism   . Vaginal delivery 2002, 2009, 2015    Past Surgical History  Procedure Laterality Date  . Appendectomy    . Tonsillectomy and adenoidectomy    . Tonsillectomy  2001  . Tonsillectomy    . Dilation and evacuation N/A 04/27/2013    Procedure: DILATATION AND EVACUATION;  Surgeon: Daria Pastures, MD;  Location: [facility];  Service: Gynecology;  Laterality: N/A;  . Laparoscopic tubal ligation Bilateral 06/08/2014    Procedure: LAPAROSCOPIC TUBAL LIGATION;  Surgeon: Daria Pastures, MD;  Location: [facility];  Service: Gynecology;  Laterality: Bilateral;    There were no vitals filed for this visit.       Subjective Assessment - 03/13/16 0851    Subjective Pt reports recently diagnosed with Lupus and seeking an overall stretching and exercise program to prevent sequelae from the disease. No pain at present but when experiencing pain, it is typically a "whole body" experience. Last flare of LUupus was in January and required hospitalization for a week. Fatigue is biggest symptom at present.   Pertinent History Lupus diagnosis  - March 2017   Patient Stated Goals "To have an exercise program to help deal with the Lupus."   Currently in Pain? No/denies            [facility] PT Assessment - 03/13/16 0847    Assessment   Medical Diagnosis Lupus   Referring Provider Gavin Pound, MD   Onset Date/Surgical Date --  March 2017   Next MD Visit Next month   Prior Therapy none   Balance Screen   Has the patient fallen in the past 6 months No   Has the patient had a decrease in activity level because of a fear of falling?  No   Is the patient reluctant to leave their home because of a fear of falling?  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   Leisure Walking, playing with kids   ROM / Strength   AROM / PROM / Strength AROM;Strength   AROM   Overall AROM  Within functional limits for tasks performed   Strength   Strength Assessment  Site Shoulder;Hip;Knee;Ankle   Right/Left Shoulder Right;Left   Right Shoulder Flexion 4-/5   Right Shoulder ABduction 4-/5   Right Shoulder Internal Rotation 4-/5   Right Shoulder External Rotation 4-/5   Left Shoulder Flexion 4-/5   Left Shoulder ABduction 4-/5   Left Shoulder Internal Rotation 4-/5   Left Shoulder External Rotation 4-/5   Right/Left Hip Right;Left   Right Hip Flexion 2+/5   Right Hip Extension 2+/5   Right Hip ABduction 2+/5   Right Hip ADduction 2+/5   Left Hip Flexion 2+/5   Left Hip Extension 2+/5   Left Hip ABduction 2+/5   Left Hip ADduction 2+/5   Right/Left Knee Right;Left   Right Knee Flexion 3/5   Right Knee Extension 3+/5   Left Knee Flexion 3/5   Left Knee Extension 3+/5   Right/Left Ankle Right;Left   Right Ankle  Dorsiflexion 3+/5   Right Ankle Plantar Flexion 3+/5   Right Ankle Inversion 3+/5   Right Ankle Eversion 3+/5   Left Ankle Dorsiflexion 3+/5   Left Ankle Plantar Flexion 3+/5   Left Ankle Inversion 3+/5   Left Ankle Eversion 3/5   Flexibility   Soft Tissue Assessment /Muscle Length yes   Hamstrings severely tight bilaterally   Quadriceps mild tightness, but more pronounced in Rf   ITB WFL   Piriformis moderately tight bilaterally                PT Education - 03/13/16 0935    Education provided Yes   Education Details PT eval findings, POC and initial HEP   Person(s) Educated Patient   Methods Explanation;Demonstration;Handout   Comprehension Verbalized understanding;Returned demonstration;Need further instruction             PT Long Term Goals - 03/13/16 0936    PT LONG TERM GOAL #1   Title Pt will be independent with comprhensive HEP by 05/08/16   Baseline Initial HEP provided today   Status New   PT LONG TERM GOAL #2   Title Pt will demostrate ability to complete 10x sit-to-stand without need for UE assistance by 05/08/16   Baseline UE assist require for all sit to stand transfers   Status New   PT LONG TERM GOAL #3   Title Pt will demonstrate B LE strength >/= 4-/5 by 05/08/16   Baseline B hip strength 2+/5, B knees 3/5 for flexion and 3+/5 for extension, and B ankles 3+/5 with exception of L ankle eversion 3/5   Status New               Plan - 03/13/16 0935    Clinical Impression Statement Debbie [name] is a 35 y/o female with myalgias, arthralgias and global muscle weakness associated with newly diagnosed systemic lupus erythematosus as of March of this year. Pt experienced a flare of the disease requiring >1 week hospitalization in January and pt feels that she has been weak since. She would like to have an exercise program to help regain her strength and prevent further sequelae of the disease. Pt currently denies pain, but states when pain present it is  typically a "whole body experience". Assessment revealed global weakness, LEs >> UEs, with average hip strength 2+/5, knees 3/5 for flexion and 3+/5 for extension, and ankles 3+/5 with exception of L ankle eversion 3/5. Moderate to severe proximal LE muscle tightness noted, especially in hamstrings and rectus femoris bilaterally. Weakness and tightness limit pt's ability to stand from chair w/o UE assistance. Recommended POC will  focus on improving LE soft tissue pliability along with core/proximal stability training, LE strengthening and manual therapy / modalities PRN for pain, with heavy emphasis on progressive HEP due to limited number of visits per [employer].   Rehab Potential Good   PT Frequency 1x / week  every other week   PT Duration 8 weeks   PT Treatment/Interventions Patient/family education;Therapeutic exercise;Neuromuscular re-education;Manual techniques;Therapeutic activities;Functional mobility training;Electrical Stimulation;Cryotherapy;Moist Heat;ADLs/Self Care Home Management   PT Next Visit Plan Review initial HEP and add core/LE strengthening   PT Home Exercise Plan Progressive HEP for lower body flexibility, core/LE strenghening & UE strengthening as tolerated   Consulted and Agree with Plan of Care Patient      Patient will benefit from skilled therapeutic intervention in order to improve the following deficits and impairments:  Decreased strength, Impaired flexibility, Decreased activity tolerance, Decreased endurance, Impaired perceived functional ability, Pain  Visit Diagnosis: Muscle weakness (generalized)  Other symptoms and signs involving the musculoskeletal system  Myalgia     Problem List Patient Active Problem List   Diagnosis Date Noted  . Chronic migraine w/o aura w/o status migrainosus, not intractable 02/13/2015  . Active labor 03/22/2014  . Varicose veins of leg with pain 02/16/2013  . Laryngitis, acute 04/28/2012  . Vomiting 04/28/2012   . Fever 04/28/2012  . Subconjunctival hemorrhage sm prob from vomiting 04/28/2012  . Anemia 01/27/2012  . Migraine 02/18/2011  . Allergic rhinitis 02/18/2011  . Hypothyroid 02/18/2011    Percival Spanish, PT, MPT 03/13/2016, 1:38 PM  [facility] [address]  [place], [place], [address] Phone: [phone]   Fax:  [phone]  Name: Zen Keeble MRN: [id number] Date of Birth: 1980-10-13

## 2016-03-20 ENCOUNTER — Ambulatory Visit: Payer: Medicaid Other | Admitting: Physical Therapy

## 2016-03-20 DIAGNOSIS — M6281 Muscle weakness (generalized): Secondary | ICD-10-CM

## 2016-03-20 DIAGNOSIS — M791 Myalgia, unspecified site: Secondary | ICD-10-CM

## 2016-03-20 DIAGNOSIS — R29898 Other symptoms and signs involving the musculoskeletal system: Secondary | ICD-10-CM

## 2016-03-20 NOTE — Therapy (Addendum)
[facility] [address]  [place], [place], [address] Phone: [phone]   Fax:  [phone]  Physical Therapy Treatment  Patient Details  Name: Debbie [name] MRN: [id number] Date of Birth: Apr 12, 1981 Referring Provider: Gavin Pound, MD  Encounter Date: 03/20/2016      PT End of Session - 03/20/16 0839    Visit Number 2   Number of Visits 4   Date for PT Re-Evaluation 05/08/16   Authorization Type Medicaid   Authorization Time Period 03/20/16 - 05/14/16   Authorization - Visit Number 1   Authorization - Number of Visits 3   PT Start Time 0839   PT Stop Time 0923   PT Time Calculation (min) 44 min   Activity Tolerance Patient tolerated treatment well   Behavior During Therapy [facility] for tasks assessed/performed      Past Medical History  Diagnosis Date  . Anemia   . Migraines   . UTI (urinary tract infection)   . Migraine   . Thyroid disease   . Varicose veins     Left > than right  . Hypothyroidism   . Vaginal delivery 2002, 2009, 2015    Past Surgical History  Procedure Laterality Date  . Appendectomy    . Tonsillectomy and adenoidectomy    . Tonsillectomy  2001  . Tonsillectomy    . Dilation and evacuation N/A 04/27/2013    Procedure: DILATATION AND EVACUATION;  Surgeon: Daria Pastures, MD;  Location: [facility];  Service: Gynecology;  Laterality: N/A;  . Laparoscopic tubal ligation Bilateral 06/08/2014    Procedure: LAPAROSCOPIC TUBAL LIGATION;  Surgeon: Daria Pastures, MD;  Location: [facility];  Service: Gynecology;  Laterality: Bilateral;    There were no vitals filed for this visit.      Subjective Assessment - 03/20/16 0840    Subjective Pt reports she had a hard time getting out of bed this morning, but did some of her stretches and that helped. States woke up with severe back pain on Sat and went to ER - x-rays taken which were negative and was told that it was likely a muscle strain; given  hydrocodone which she took for 3 days and then stopped. Denies LBP today but states her typical mild "whole body" pain is 2/10.   Patient Stated Goals "To have an exercise program to help deal with the Lupus."   Currently in Pain? Yes   Pain Score 2    Pain Location Other (Comment)  "whole body"         Today's Treatment  TherEx NuStep - lvl 3 x 4' Initial HEP review:   B SKTC stretch 2x30"   B Mod thomas hip flexor stretch x30"   B Seated piriformis stretch x30"   B Supine HS stretch x30"   B KTOS piriformis stretch x30"   LTR 2x20"   Abdominal bracing 10x5" Brace marching 10x3" Hooklying B Hip Adduction ball squeeze 10x5" Bridge 10x5" (limited hip clearance) Hookyling Alternating Hip ABD/ER with green TB 10x3" B Sidelying Hip ABD/ER clam with green TB 10x3" TRX squat x10 Heel raises 10x3" B Shoulder Row with green TB 10x3'" B Shoulder ER with green TB 10x3"          PT Education - 03/20/16 0925    Education provided Yes   Education Details Review of initial HEP + addition of basic strengthening program   Person(s) Educated Patient   Methods Explanation;Demonstration;Handout   Comprehension Verbalized  understanding;Returned demonstration;Need further instruction             PT Long Term Goals - 03/20/16 0930    PT LONG TERM GOAL #1   Title Pt will be independent with comprhensive HEP by 05/08/16   Baseline Initial HEP provided today   Status On-going   PT LONG TERM GOAL #2   Title Pt will demostrate ability to complete 10x sit-to-stand without need for UE assistance by 05/08/16   Baseline UE assist require for all sit to stand transfers   Status On-going   PT LONG TERM GOAL #3   Title Pt will demonstrate B LE strength >/= 4-/5 by 05/08/16   Baseline B hip strength 2+/5, B knees 3/5 for flexion and 3+/5 for extension, and B ankles 3+/5 with exception of L ankle eversion 3/5   Status On-going               Plan - 03/20/16 0926    Clinical  Impression Statement Pt admits to only attempting some of the streches from the initial HEP, therefore reviewed full initial HEP and clarified reps and hold times for stretches. Introduced Actuary with emphasis on core strength and stabilization, especially given pt report of episode of severe LBP over weekend. Pt encouraged to break up exercise but complete each exercise at least 3x/wk. Pt to return in 2 weeks for review/update as indicated.   Rehab Potential Good   PT Frequency 1x / week  every other week   PT Duration 8 weeks   PT Treatment/Interventions Patient/family education;Therapeutic exercise;Neuromuscular re-education;Manual techniques;Therapeutic activities;Functional mobility training;Electrical Stimulation;Cryotherapy;Moist Heat;ADLs/Self Care Home Management   PT Next Visit Plan Review currents HEPs as needed and progress core/LE strengthening as tolerated   PT Home Exercise Plan Progressive HEP for lower body flexibility, core/LE strenghening & UE strengthening as tolerated   Consulted and Agree with Plan of Care Patient      Patient will benefit from skilled therapeutic intervention in order to improve the following deficits and impairments:  Decreased strength, Impaired flexibility, Decreased activity tolerance, Decreased endurance, Impaired perceived functional ability, Pain  Visit Diagnosis: Muscle weakness (generalized)  Other symptoms and signs involving the musculoskeletal system  Myalgia     Problem List Patient Active Problem List   Diagnosis Date Noted  . Chronic migraine w/o aura w/o status migrainosus, not intractable 02/13/2015  . Active labor 03/22/2014  . Varicose veins of leg with pain 02/16/2013  . Laryngitis, acute 04/28/2012  . Vomiting 04/28/2012  . Fever 04/28/2012  . Subconjunctival hemorrhage sm prob from vomiting 04/28/2012  . Anemia 01/27/2012  . Migraine 02/18/2011  . Allergic rhinitis 02/18/2011  . Hypothyroid  02/18/2011    Percival Spanish, PT, MPT 03/20/2016, 9:31 AM  [facility] [address]  [place], [place], [address] Phone: [phone]   Fax:  [phone]  Name: Debbie [name] MRN: [id number] Date of Birth: 12-05-1980   PHYSICAL THERAPY DISCHARGE SUMMARY  Visits from Start of Care: 2  Current functional level related to goals / functional outcomes:   Pt only returned for 1 of 3 visits approved following the eval and reported limited compliance with HEP on that initial follow up visit. Pt cancelled and was a no show for the remaining 2 visits. Medicaid authorized periord expires today, therefore will proceed with D/C. Unable to formally assess status at D/C due to failure to return.   Remaining deficits:   As above  Education / Equipment:   HEP  Plan: Patient agrees to discharge.  Patient goals were not met. Patient is being discharged due to not returning since the last visit.  ?????    Percival Spanish, PT, MPT 05/14/16, 11:42 AM  [facility] [address]  [place], [place], [address] Phone: [phone]   Fax:  [phone]

## 2016-04-03 ENCOUNTER — Ambulatory Visit: Payer: Medicaid Other | Attending: Rheumatology | Admitting: Physical Therapy

## 2016-04-17 ENCOUNTER — Ambulatory Visit: Payer: Medicaid Other | Admitting: Physical Therapy

## 2016-04-25 ENCOUNTER — Telehealth: Payer: Self-pay | Admitting: Neurology

## 2016-04-25 ENCOUNTER — Other Ambulatory Visit: Payer: Self-pay | Admitting: *Deleted

## 2016-04-25 ENCOUNTER — Other Ambulatory Visit: Payer: Self-pay | Admitting: Neurology

## 2016-04-25 MED ORDER — CYCLOBENZAPRINE HCL 5 MG PO TABS: 5.0000 mg | ORAL_TABLET | Freq: Three times a day (TID) | ORAL | 1 refills | Status: DC | PRN

## 2016-04-25 NOTE — Telephone Encounter (Signed)
Okay per Dr Jaynee Eagles to send in refill for enough to get her to appt for 05/28/16. Pt must f/u to receive further refills.

## 2016-04-25 NOTE — Telephone Encounter (Signed)
Patient called to request refill of cyclobenzaprine (FLEXERIL) 5 MG tablet

## 2016-05-28 ENCOUNTER — Ambulatory Visit (INDEPENDENT_AMBULATORY_CARE_PROVIDER_SITE_OTHER): Payer: Medicaid Other | Admitting: Neurology

## 2016-05-28 ENCOUNTER — Encounter: Payer: Self-pay | Admitting: Neurology

## 2016-05-28 VITALS — BP 99/62 | HR 72 | Ht 62.0 in | Wt 165.4 lb

## 2016-05-28 DIAGNOSIS — G43101 Migraine with aura, not intractable, with status migrainosus: Secondary | ICD-10-CM

## 2016-05-28 DIAGNOSIS — H538 Other visual disturbances: Secondary | ICD-10-CM | POA: Diagnosis not present

## 2016-05-28 DIAGNOSIS — R42 Dizziness and giddiness: Secondary | ICD-10-CM | POA: Diagnosis not present

## 2016-05-28 DIAGNOSIS — R519 Headache, unspecified: Secondary | ICD-10-CM

## 2016-05-28 DIAGNOSIS — R51 Headache: Secondary | ICD-10-CM | POA: Diagnosis not present

## 2016-05-28 DIAGNOSIS — H539 Unspecified visual disturbance: Secondary | ICD-10-CM

## 2016-05-28 DIAGNOSIS — G43009 Migraine without aura, not intractable, without status migrainosus: Secondary | ICD-10-CM

## 2016-05-28 MED ORDER — ALPRAZOLAM 0.5 MG PO TABS: ORAL_TABLET | ORAL | 0 refills | Status: DC

## 2016-05-28 MED ORDER — CYCLOBENZAPRINE HCL 5 MG PO TABS: 5.0000 mg | ORAL_TABLET | Freq: Three times a day (TID) | ORAL | 12 refills | Status: DC | PRN

## 2016-05-28 MED ORDER — KETOROLAC TROMETHAMINE 60 MG/2ML IM SOLN
60.0000 mg | Freq: Once | INTRAMUSCULAR | Status: AC
  Administered 2016-05-28: 60 mg via INTRAMUSCULAR

## 2016-05-28 MED ORDER — TOPIRAMATE ER 50 MG PO CAP24: 50.0000 mg | ORAL_CAPSULE | Freq: Every day | ORAL | 11 refills | Status: DC

## 2016-05-28 NOTE — Progress Notes (Signed)
[facility]    Provider:  Dr Jaynee Eagles Referring Provider: Chevis Pretty, MD Primary Care Physician:  Thurman Coyer, MD  CC: Migraines  Interval history 05/28/2016: Has not been seen for close to a year. She was diagnosed with discoid lupus. She stopped taking the trokendi in May. She is having migraines every other day. Trokendi was working. She did not tolerate the Topiramate but Trokendi was fine and helped migraines. She has worsening migraines and dizziness and vision changes with blurry vision. Migraines are worsening, every other day, can be severe. Headaches are in the fron of the head, or all on the right or left, lught sensitivity, nausea, vomiting. They can last the whole day. Discussed imaging again, MRI of the brain given worsening headaches, vision changes, ongoing headaches for years.  Tried: Nortriptyline, Topamax and Trokendi, flexeril, zonisamide, patient stopped taking most of these without informing physician.   Interval update 06/12/2015: Headaches are getting worse. 2-3x a day. Will start Topamax. She is worried that something is wrong. She has blurry vision, vision changes. They are severe. The Nortriptyline didn't help at all. She wants to go back on Topamax. Discussed Trokendi, maybe she will tolerate it better. It is taken at night. She had to sleep with a cool rag over her head last night due to the migraine pain. She is also having dizziness.     Interval history: She is having headaches several times a week. headaches last all day. Flexeril helped but makes her too sleepy. She stopped Topamax due to intolerance. Discussed that we can try different medication, will try Nortriptyline at night and zofran for nausea. Relpax for acute management with zofran for nausea.   HPI: Ardath Longest is a 35 y.o. female here as a referral from the hospital for Headaches. She has migraines for years since teens. She is having her migraines 3-4 times a week. She  has throbbing on the right side, whole right side to the neck. She has light sensitivity, sound sensitivity, nausea. Has vomited. A dark room helps. She gets dizziness with the headaches. No vision or hearing changes. No other focal neurologic symptoms. No aura. She has been taking OTC medications which don' t help. She is taking too many motrins. Migraines can last all day and worsening. Can get to be 10/10 pain. She has had tubal ligation. No known triggers.   Review of Systems: Patient complains of symptoms per HPI as well as the following symptoms: coled intolerance, diarrhea, memory loss, dizziness, headache. Pertinent negatives per HPI. All others negative.    Social History   Social History  . Marital status: Single    Spouse name: N/A  . Number of children: 3  . Years of education: College    Occupational History  . Not on file.   Social History Main Topics  . Smoking status: Never Smoker  . Smokeless tobacco: Never Used  . Alcohol use 0.0 oz/week     Comment: Socially   . Drug use: No  . Sexual activity: Yes    Birth control/ protection: None   Other Topics Concern  . Not on file   Social History Narrative   Lives at home with husband, kids.   Caffeine use: Drinks 1 cup coffee per day       Family History  Problem Relation Age of Onset  . Hypertension Mother   . Hyperlipidemia Father   . Diabetes Father   . Hypertension Father   . Arthritis Neg Hx  family  . Migraines Neg Hx     Past Medical History:  Diagnosis Date  . Anemia   . Hypothyroidism   . Migraine   . Migraines   . Thyroid disease   . UTI (urinary tract infection)   . Vaginal delivery 2002, 2009, 2015  . Varicose veins    Left > than right    Past Surgical History:  Procedure Laterality Date  . APPENDECTOMY    . DILATION AND EVACUATION N/A 04/27/2013   Procedure: DILATATION AND EVACUATION;  Surgeon: Daria Pastures, MD;  Location: [facility];  Service: Gynecology;  Laterality:  N/A;  . LAPAROSCOPIC TUBAL LIGATION Bilateral 06/08/2014   Procedure: LAPAROSCOPIC TUBAL LIGATION;  Surgeon: Daria Pastures, MD;  Location: [facility];  Service: Gynecology;  Laterality: Bilateral;  . TONSILLECTOMY  2001  . TONSILLECTOMY    . TONSILLECTOMY AND ADENOIDECTOMY      Current Outpatient Prescriptions  Medication Sig Dispense Refill  . cyclobenzaprine (FLEXERIL) 5 MG tablet Take 1 tablet (5 mg total) by mouth every 8 (eight) hours as needed for muscle spasms. 60 tablet 1  . pantoprazole (PROTONIX) 40 MG tablet Take 40 mg by mouth as needed. Reported on 03/13/2016  0  . SYNTHROID 175 MCG tablet Take 175 mcg by mouth daily.  2  . Topiramate ER (TROKENDI XR) 50 MG CP24 Take 50 mg by mouth at bedtime. 30 capsule 11   No current facility-administered medications for this visit.     Allergies as of 05/28/2016 - Review Complete 03/20/2016  Allergen Reaction Noted  . Aspirin Hives 02/18/2011  . Bactrim Swelling and Rash 10/24/2011  . Sulfa antibiotics Swelling and Rash 12/20/2011    Vitals: BP 99/62 (BP Location: Right Arm, Patient Position: Sitting, Cuff Size: Normal)   Pulse 72   Ht 5\' 2"  (1.575 m)   Wt 165 lb 6.4 oz (75 kg)   BMI 30.25 kg/m  Last Weight:  Wt Readings from Last 1 Encounters:  05/28/16 165 lb 6.4 oz (75 kg)   Last Height:   Ht Readings from Last 1 Encounters:  05/28/16 5\' 2"  (1.575 m)    Cranial Nerves:  The pupils are equal, round, and reactive to light. The fundi are normal and spontaneous venous pulsations are present. Visual fields are full to finger confrontation. Extraocular movements are intact. Trigeminal sensation is intact and the muscles of mastication are normal. The face is symmetric. The palate elevates in the midline. Hearing intact. Voice is normal. Shoulder shrug is normal. The tongue has normal motion without fasciculations.   Coordination:  Normal finger to nose and heel to shin. Normal rapid alternating movements.   Gait:   Heel-toe and tandem gait are normal.   Motor Observation:  No asymmetry, no atrophy, and no involuntary movements noted. Tone:  Normal muscle tone.   Posture:  Posture is normal. normal erect   Strength:  Strength is V/V in the upper and lower limbs.    Sensation: intact to LT   Reflex Exam:  DTR's:  Deep tendon reflexes in the upper and lower extremities are normal bilaterally.  Toes:  The toes are downgoing bilaterally.  Clonus:  Clonus is absent.      Assessment/Plan: 35 year old patient with chronic migraines, without aura, not intractable  As far as your medications are concerned, I would like to suggest:   Restart Trokendi. She has had tubal ligation. Common side effects include Common side effects of Topamax include: paresthesia, nausea, ataxia, lack of  concentration, diplopia, nervousness, dizziness, memory impairment, speech disturbance, anxiety, dysphasia, drowsiness, psychomotor disturbance, confusion, fatigue, diarrhea, visual disturbance, weight loss, depression, dysgeusia, mood changes but stop for anything concerning especially abdominal pain as nephrolithiasis is possible. At the onset of headache, take Relpax. Can repeat it in 2 hours. Don't take it more than 2x a day or 2 days a week. If nauseated, take zofran Will follow up sooner this time in 3 months MRi o fthr brain    Sarina Ill, MD  [facility] [address] [place], [place] [address]  Phone [phone] Fax [phone]  A total of 30 minutes was spent face-to-face with this patient. Over half this time was spent on counseling patient on the migraine diagnosis and different diagnostic and therapeutic options available.

## 2016-05-28 NOTE — Progress Notes (Signed)
Gave Toradol 60mg /35ml IM in right deltoid. Cleaned with alcohol wipe prior to injection. Band-aid applied. Pt tolerated well.

## 2016-05-28 NOTE — Patient Instructions (Signed)
Remember to drink plenty of fluid, eat healthy meals and do not skip any meals. Try to eat protein with a every meal and eat a healthy snack such as fruit or nuts in between meals. Try to keep a regular sleep-wake schedule and try to exercise daily, particularly in the form of walking, 20-30 minutes a day, if you can.   As far as your medications are concerned, I would like to suggest tokendi 50mg  at bedtime  As far as diagnostic testing: mri brain   I would like to see you back in 4-6 months, sooner if we need to. Please call us with any interim questions, concerns, problems, updates or refill requests.   Our phone number is [phone]. We also have an after hours call service for urgent matters and there is a physician on-call for urgent questions. For any emergencies you know to call 911 or go to the nearest emergency room

## 2016-05-30 ENCOUNTER — Telehealth: Payer: Self-pay | Admitting: *Deleted

## 2016-05-30 NOTE — Telephone Encounter (Signed)
Spoke with [place], [place] and began [place] # [id number]. Interaction # [id number]. She advised she will send information to pharmacist for review, and for this office to call back to check on status.

## 2016-06-04 NOTE — Telephone Encounter (Signed)
Spoke with Lurline Idol, [place] who stated PA for Trokendi XR is approved from 05/30/16 until 05/25/2017.  Ticket #  [id number].

## 2016-06-05 ENCOUNTER — Other Ambulatory Visit: Payer: Self-pay | Admitting: Neurology

## 2016-06-05 NOTE — Telephone Encounter (Signed)
Patient called to advise, insurance won't cover Topiramate ER (TROKENDI XR) 50 MG CP24, it needs PA from Dr. Jaynee Eagles. Patient advised, our office received notification from [place] late yesterday afternoon that this medication was approved from 05/30/16 until 05/25/17. Patient will notify pharmacy.

## 2016-06-09 ENCOUNTER — Other Ambulatory Visit: Payer: Medicaid Other

## 2016-06-25 ENCOUNTER — Ambulatory Visit
Admission: RE | Admit: 2016-06-25 | Discharge: 2016-06-25 | Disposition: A | Discharge status: Home / Self Care | Payer: Medicaid Other | Source: Ambulatory Visit | Attending: Neurology | Admitting: Neurology

## 2016-06-25 DIAGNOSIS — H538 Other visual disturbances: Secondary | ICD-10-CM

## 2016-06-25 DIAGNOSIS — R51 Headache: Secondary | ICD-10-CM | POA: Diagnosis not present

## 2016-06-25 DIAGNOSIS — R42 Dizziness and giddiness: Secondary | ICD-10-CM | POA: Diagnosis not present

## 2016-06-25 DIAGNOSIS — H539 Unspecified visual disturbance: Secondary | ICD-10-CM

## 2016-06-25 DIAGNOSIS — R519 Headache, unspecified: Secondary | ICD-10-CM

## 2016-06-25 MED ORDER — GADOBENATE DIMEGLUMINE 529 MG/ML IV SOLN
15.0000 mL | Freq: Once | INTRAVENOUS | Status: AC | PRN
  Administered 2016-06-25: 15 mL via INTRAVENOUS

## 2016-06-26 ENCOUNTER — Telehealth: Payer: Self-pay | Admitting: *Deleted

## 2016-06-26 NOTE — Telephone Encounter (Signed)
Called and spoke to patient about normal MRI brain. She verbalized understanding and has no further questions at this time.

## 2016-06-26 NOTE — Telephone Encounter (Signed)
-----   Message from Melvenia Beam, MD sent at 06/26/2016  3:12 PM EDT ----- MRI brain normal thanks

## 2016-08-28 ENCOUNTER — Ambulatory Visit: Payer: Medicaid Other | Admitting: Neurology

## 2016-08-28 ENCOUNTER — Telehealth: Payer: Self-pay | Admitting: *Deleted

## 2016-08-28 NOTE — Telephone Encounter (Signed)
no showed f/u 

## 2016-09-02 ENCOUNTER — Encounter: Payer: Self-pay | Admitting: Neurology

## 2016-10-02 ENCOUNTER — Encounter (HOSPITAL_BASED_OUTPATIENT_CLINIC_OR_DEPARTMENT_OTHER): Payer: Self-pay | Admitting: Emergency Medicine

## 2016-10-02 ENCOUNTER — Emergency Department (HOSPITAL_BASED_OUTPATIENT_CLINIC_OR_DEPARTMENT_OTHER): Payer: Medicaid Other

## 2016-10-02 ENCOUNTER — Emergency Department (HOSPITAL_BASED_OUTPATIENT_CLINIC_OR_DEPARTMENT_OTHER)
Admission: EM | Admit: 2016-10-02 | Discharge: 2016-10-02 | Disposition: A | Discharge status: Home / Self Care | Payer: Medicaid Other | Attending: Emergency Medicine | Admitting: Emergency Medicine

## 2016-10-02 DIAGNOSIS — R109 Unspecified abdominal pain: Secondary | ICD-10-CM | POA: Diagnosis present

## 2016-10-02 DIAGNOSIS — K529 Noninfective gastroenteritis and colitis, unspecified: Secondary | ICD-10-CM

## 2016-10-02 DIAGNOSIS — Z79899 Other long term (current) drug therapy: Secondary | ICD-10-CM | POA: Diagnosis not present

## 2016-10-02 DIAGNOSIS — N83202 Unspecified ovarian cyst, left side: Secondary | ICD-10-CM | POA: Diagnosis not present

## 2016-10-02 DIAGNOSIS — E039 Hypothyroidism, unspecified: Secondary | ICD-10-CM | POA: Insufficient documentation

## 2016-10-02 LAB — URINALYSIS, ROUTINE W REFLEX MICROSCOPIC
Bilirubin Urine: NEGATIVE
GLUCOSE, UA: NEGATIVE mg/dL
HGB URINE DIPSTICK: NEGATIVE
KETONES UR: NEGATIVE mg/dL
Nitrite: NEGATIVE
PH: 7 (ref 5.0–8.0)
PROTEIN: NEGATIVE mg/dL
Specific Gravity, Urine: 1.025 (ref 1.005–1.030)

## 2016-10-02 LAB — CBC WITH DIFFERENTIAL/PLATELET
Basophils Absolute: 0 10*3/uL (ref 0.0–0.1)
Basophils Relative: 0 %
Eosinophils Absolute: 0.3 10*3/uL (ref 0.0–0.7)
Eosinophils Relative: 4 %
HCT: 30.8 % — ABNORMAL LOW (ref 36.0–46.0)
Hemoglobin: 10.6 g/dL — ABNORMAL LOW (ref 12.0–15.0)
Lymphocytes Relative: 30 %
Lymphs Abs: 2.6 10*3/uL (ref 0.7–4.0)
MCH: 22.2 pg — ABNORMAL LOW (ref 26.0–34.0)
MCHC: 34.4 g/dL (ref 30.0–36.0)
MCV: 64.6 fL — ABNORMAL LOW (ref 78.0–100.0)
Monocytes Absolute: 1.2 10*3/uL — ABNORMAL HIGH (ref 0.1–1.0)
Monocytes Relative: 14 %
Neutro Abs: 4.4 10*3/uL (ref 1.7–7.7)
Neutrophils Relative %: 52 %
Platelets: 301 10*3/uL (ref 150–400)
RBC: 4.77 MIL/uL (ref 3.87–5.11)
RDW: 16.1 % — ABNORMAL HIGH (ref 11.5–15.5)
WBC: 8.5 10*3/uL (ref 4.0–10.5)

## 2016-10-02 LAB — COMPREHENSIVE METABOLIC PANEL
ALBUMIN: 4.2 g/dL (ref 3.5–5.0)
ALT: 10 U/L — AB (ref 14–54)
AST: 17 U/L (ref 15–41)
Alkaline Phosphatase: 36 U/L — ABNORMAL LOW (ref 38–126)
Anion gap: 5 (ref 5–15)
BILIRUBIN TOTAL: 0.6 mg/dL (ref 0.3–1.2)
BUN: 22 mg/dL — AB (ref 6–20)
CALCIUM: 8.9 mg/dL (ref 8.9–10.3)
CO2: 23 mmol/L (ref 22–32)
Chloride: 110 mmol/L (ref 101–111)
Creatinine, Ser: 0.7 mg/dL (ref 0.44–1.00)
GFR calc Af Amer: >60 mL/min (ref >60)
GFR calc non Af Amer: >60 mL/min (ref >60)
GLUCOSE: 84 mg/dL (ref 65–99)
Potassium: 3.8 mmol/L (ref 3.5–5.1)
SODIUM: 138 mmol/L (ref 135–145)
TOTAL PROTEIN: 6.5 g/dL (ref 6.5–8.1)

## 2016-10-02 LAB — URINALYSIS, MICROSCOPIC (REFLEX)

## 2016-10-02 LAB — PREGNANCY, URINE: Preg Test, Ur: NEGATIVE

## 2016-10-02 MED ORDER — OXYCODONE-ACETAMINOPHEN 5-325 MG PO TABS: 1.0000 | ORAL_TABLET | ORAL | 0 refills | Status: DC | PRN

## 2016-10-02 MED ORDER — ONDANSETRON HCL 4 MG PO TABS: 4.0000 mg | ORAL_TABLET | Freq: Four times a day (QID) | ORAL | 0 refills | Status: DC

## 2016-10-02 MED ORDER — ONDANSETRON HCL 4 MG/2ML IJ SOLN
INTRAMUSCULAR | Status: AC
  Filled 2016-10-02: qty 2

## 2016-10-02 MED ORDER — IOPAMIDOL (ISOVUE-300) INJECTION 61%
100.0000 mL | Freq: Once | INTRAVENOUS | Status: AC | PRN
  Administered 2016-10-02: 100 mL via INTRAVENOUS

## 2016-10-02 MED ORDER — ONDANSETRON HCL 4 MG/2ML IJ SOLN
4.0000 mg | Freq: Once | INTRAMUSCULAR | Status: AC
  Administered 2016-10-02: 4 mg via INTRAVENOUS

## 2016-10-02 MED ORDER — ONDANSETRON 4 MG PO TBDP
4.0000 mg | ORAL_TABLET | Freq: Once | ORAL | Status: AC | PRN
  Administered 2016-10-02: 4 mg via ORAL
  Filled 2016-10-02: qty 1

## 2016-10-02 MED ORDER — MORPHINE SULFATE (PF) 4 MG/ML IV SOLN
4.0000 mg | Freq: Once | INTRAVENOUS | Status: AC
  Administered 2016-10-02: 4 mg via INTRAVENOUS
  Filled 2016-10-02: qty 1

## 2016-10-02 NOTE — ED Notes (Signed)
Spoke to pt about her pain and nausea.  Stated she wanted to wait for additional nausea medicine until CT results were in.  Requested something for pain.  Notified PA.  No orders at this time.

## 2016-10-02 NOTE — ED Notes (Signed)
Pt states she drove up here, but that her father will come pick her up. Pt advised if she receives the morphine than her father will need to be at bedside before she can be discharged. Pt is agreeable.

## 2016-10-02 NOTE — ED Provider Notes (Signed)
[facility] Provider Note   CSN: [id number] Arrival date & time: 10/02/16  1745  By signing my name below, I, Debbie [name], attest that this documentation has been prepared under the direction and in the presence of [employer], PA-C.  Electronically Signed: Reola [name], ED Scribe. 10/02/16. 6:55 PM.  History   Chief Complaint Chief Complaint  Patient presents with  . Diarrhea  . Abdominal Pain   The history is provided by the patient. No language interpreter was used.   HPI Comments: Debbie [name] is a 36 y.o. female with a h/o anemia, systemic lupus, and hypothyroidism, who presents to the Emergency Department complaining of gradually worsening, persistent left-sided abdominal pain beginning earlier today. Pt notes that prior to the onset of her pain that she has been experiencing episodic diarrhea, nausea, and emesis over the past three days. She also notes over the past week that she has had a persistent cough and congestion, but this has been mildly improving. Pt has additionally had decreased PO intake over the past two days secondary to it exacerbating her vomiting/diarrhea. Pt has been taking 600mg  Ibuprofen at home intermittently with minimal relief of her pain. However, she notes that sitting in a fetal position will mildly alleviate her pain. Pt has a prior surgical history including appendectomy and tubal ligation. She does have a h/o systemic Lupus and is currently followed by a Rheumatologist for this. No recent renal malfunctions. Her symptoms today are not consistent with her typical flare-ups, and she is not medicated daily for this  issue. No sick contacts with similar symptoms. She denies hematochezia, hematemesis, fever, or any other associated symptoms.   Past Medical History:  Diagnosis Date  . Anemia   . Hypothyroidism   . Migraine   . Migraines   . Thyroid disease   . UTI (urinary tract infection)   . Vaginal delivery 2002, 2009, 2015    . Varicose veins    Left > than right   Patient Active Problem List   Diagnosis Date Noted  . Chronic migraine w/o aura w/o status migrainosus, not intractable 02/13/2015  . Active labor 03/22/2014  . Varicose veins of leg with pain 02/16/2013  . Laryngitis, acute 04/28/2012  . Vomiting 04/28/2012  . Fever 04/28/2012  . Subconjunctival hemorrhage sm prob from vomiting 04/28/2012  . Anemia 01/27/2012  . Migraine 02/18/2011  . Allergic rhinitis 02/18/2011  . Hypothyroid 02/18/2011   Past Surgical History:  Procedure Laterality Date  . APPENDECTOMY    . DILATION AND EVACUATION N/A 04/27/2013   Procedure: DILATATION AND EVACUATION;  Surgeon: Daria Pastures, MD;  Location: [facility];  Service: Gynecology;  Laterality: N/A;  . LAPAROSCOPIC TUBAL LIGATION Bilateral 06/08/2014   Procedure: LAPAROSCOPIC TUBAL LIGATION;  Surgeon: Daria Pastures, MD;  Location: [facility];  Service: Gynecology;  Laterality: Bilateral;  . TONSILLECTOMY  2001  . TONSILLECTOMY    . TONSILLECTOMY AND ADENOIDECTOMY     OB History    Gravida Para Term Preterm AB Living   6 3 3   3 3    SAB TAB Ectopic Multiple Live Births   2 1     3      Home Medications    Prior to Admission medications   Medication Sig Start Date End Date Taking? Authorizing Provider  SYNTHROID 175 MCG tablet Take 175 mcg by mouth daily. 04/28/15  Yes Historical Provider, MD  Topiramate ER (TROKENDI XR) 50 MG CP24 Take 50 mg by mouth at  bedtime. 05/28/16  Yes Melvenia Beam, MD  ALPRAZolam Duanne Moron) 0.5 MG tablet Take 1-2 30-60 minutes before mri brain may take one additional if needed 05/28/16   Melvenia Beam, MD  cyclobenzaprine (FLEXERIL) 5 MG tablet Take 1 tablet (5 mg total) by mouth every 8 (eight) hours as needed for muscle spasms. 05/28/16   Melvenia Beam, MD  ondansetron (ZOFRAN) 4 MG tablet Take 1 tablet (4 mg total) by mouth every 6 (six) hours. 10/02/16   Okey Regal, PA-C  oxyCODONE-acetaminophen (PERCOCET/ROXICET) 5-325 MG  tablet Take 1 tablet by mouth every 4 (four) hours as needed for severe pain. 10/02/16   Okey Regal, PA-C  pantoprazole (PROTONIX) 40 MG tablet Take 40 mg by mouth as needed. Reported on 03/13/2016 06/01/15   Historical Provider, MD   Family History Family History  Problem Relation Age of Onset  . Hypertension Mother   . Hyperlipidemia Father   . Diabetes Father   . Hypertension Father   . Arthritis Neg Hx     family  . Migraines Neg Hx    Social History Social History  Substance Use Topics  . Smoking status: Never Smoker  . Smokeless tobacco: Never Used  . Alcohol use 0.0 oz/week     Comment: Socially    Allergies   Aspirin; Bactrim; and Sulfa antibiotics  Review of Systems Review of Systems  Constitutional: Negative for fever.  HENT: Positive for congestion.   Respiratory: Positive for cough.   Gastrointestinal: Positive for abdominal pain, diarrhea, nausea and vomiting.       Negative for hematochezia and hematemesis.   All other systems reviewed and are negative.  Physical Exam Updated Vital Signs BP 122/79   Pulse 79   Temp 98.3 F (36.8 C) (Oral)   Resp 20   Ht 5\' 2"  (1.575 m)   Wt 73 kg   SpO2 100%   BMI 29.45 kg/m   Physical Exam  Constitutional: She appears well-developed and well-nourished. No distress.  HENT:  Head: Normocephalic and atraumatic.  Eyes: Conjunctivae are normal.  Neck: Normal range of motion.  Cardiovascular: Normal rate, regular rhythm and normal heart sounds.   No murmur heard. Pulmonary/Chest: Effort normal and breath sounds normal. No respiratory distress. She has no wheezes. She has no rales.  Abdominal: Soft. She exhibits no distension. There is tenderness. There is no rebound and no guarding.  LLQ tenderness to palpation.   Musculoskeletal: Normal range of motion.  Neurological: She is alert.  Skin: No pallor.  Psychiatric: She has a normal mood and affect. Her behavior is normal.  Nursing note and vitals reviewed.  ED  Treatments / Results  DIAGNOSTIC STUDIES: Oxygen Saturation is 100% on RA, normal by my interpretation.   COORDINATION OF CARE: 6:55 PM-Discussed next steps with pt. Pt verbalized understanding and is agreeable with the plan.   Labs (all labs ordered are listed, but only abnormal results are displayed) Labs Reviewed  COMPREHENSIVE METABOLIC PANEL - Abnormal; Notable for the following:       Result Value   BUN 22 (*)    ALT 10 (*)    Alkaline Phosphatase 36 (*)    All other components within normal limits  URINALYSIS, ROUTINE W REFLEX MICROSCOPIC - Abnormal; Notable for the following:    Leukocytes, UA MODERATE (*)    All other components within normal limits  URINALYSIS, MICROSCOPIC (REFLEX) - Abnormal; Notable for the following:    Bacteria, UA RARE (*)    Squamous Epithelial /  LPF 0-5 (*)    All other components within normal limits  CBC WITH DIFFERENTIAL/PLATELET - Abnormal; Notable for the following:    Hemoglobin 10.6 (*)    HCT 30.8 (*)    MCV 64.6 (*)    MCH 22.2 (*)    RDW 16.1 (*)    Monocytes Absolute 1.2 (*)    All other components within normal limits  PREGNANCY, URINE   Radiology Dg Chest 2 View  Result Date: 10/02/2016 CLINICAL DATA:  Cough for 1 week. EXAM: CHEST  2 VIEW COMPARISON:  PA and lateral chest 10/02/2015. FINDINGS: Lungs are clear. Heart size is normal. No pneumothorax or pleural fluid. No bony abnormality. IMPRESSION: Negative chest. Electronically Signed   By: Inge Rise M.D.   On: 10/02/2016 20:22   Ct Abdomen Pelvis W Contrast  Result Date: 10/02/2016 CLINICAL DATA:  Three day history of watery diarrhea and abdominal pain. Vomiting. EXAM: CT ABDOMEN AND PELVIS WITH CONTRAST TECHNIQUE: Multidetector CT imaging of the abdomen and pelvis was performed using the standard protocol following bolus administration of intravenous contrast. CONTRAST:  126mL ISOVUE-300 IOPAMIDOL (ISOVUE-300) INJECTION 61% COMPARISON:  Acute abdominal series 10/16/2014  FINDINGS: Lower chest: The lung bases are clear without focal nodule, mass, or airspace disease. Heart size is normal. There is is no pleural or pericardial effusion. Hepatobiliary: The liver is unremarkable. No focal lesions are present. The common bile duct and gallbladder are within normal limits. Pancreas: Unremarkable. No pancreatic ductal dilatation or surrounding inflammatory changes. Spleen: The spleen is markedly atrophic. No focal lesions are present. Adrenals/Urinary Tract: The adrenal glands are normal bilaterally. A 6 mm cyst is evident laterally in the left kidney. No other focal renal lesions are present. There is no stone or obstruction. The ureters are within normal limits bilaterally. Stomach/Bowel: The stomach is mildly distended. There is no obstructing mass lesion. The duodenum is within normal limits. The small bowel is unremarkable. The appendix is surgically absent. The ascending and transverse colon are within normal limits. Diverticular changes are present within the sigmoid colon without focal inflammation to suggest diverticulitis. Vascular/Lymphatic: No focal vascular lesions are present. Multiple subcentimeter para-aortic lymph nodes are present. Reproductive: The uterus is mildly enlarged and heterogeneous. No discrete mass lesion is present. The left ovary demonstrates 2 dominant cysts measuring 3.7 cm each. There is no significant stranding about the left ovary. Right ovary is within normal limits. Other: No significant free fluid or free air is present. Musculoskeletal: Bone windows are unremarkable. No focal lytic or blastic lesions are present. The bony pelvis is intact. IMPRESSION: 1. Prominent cysts within the left ovary measure up to 3.7 cm. This is almost certainly benign, and no specific imaging follow up is recommended according to the Society of Radiologists in [place] Statement (D Clovis Riley et al. Management of Asymptomatic Ovarian and Other  Adnexal Cysts Imaged at [place]: Society of Radiologists in [place] Statement 2010. Radiology 256 (Sept 2010): 943-954.). 2. Diffuse heterogeneous thickening Ing enhancement of the uterus suggesting uterine adenomyosis. No discrete lesion is evident. 3. Sigmoid diverticulosis without diverticulitis. 4. Left renal cyst. Electronically Signed   By: San Morelle M.D.   On: 10/02/2016 20:26    Procedures Procedures   Medications Ordered in ED Medications  ondansetron (ZOFRAN-ODT) disintegrating tablet 4 mg (4 mg Oral Given 10/02/16 1805)  morphine 4 MG/ML injection 4 mg (4 mg Intravenous Given 10/02/16 1925)  ondansetron (ZOFRAN) injection 4 mg (4 mg Intravenous Given 10/02/16 1925)  iopamidol (  ISOVUE-300) 61 % injection 100 mL (100 mLs Intravenous Contrast Given 10/02/16 2006)   Initial Impression / Assessment and Plan / ED Course  I have reviewed the triage vital signs and the nursing notes.  Pertinent labs & imaging results that were available during my care of the patient were reviewed by me and considered in my medical decision making (see chart for details).  Clinical Course    Labs: CMP, UA and Urine Pregnancy, CBC  Imaging: CXR, CT A/P  Consults:   Therapeutics: 4mg  Zofran, 4mg  Morphine  Discharge Meds:  Assessment/Plan: Patient's presentation is most consistent with gastroenteritis and ovarian cyst. Her CT scan returned showing  ovarian cysts, no signs of infectious etiology. Patient with no episodes of vomiting while here in the ED, appears hydrated and tolerating by mouth.  Patient will be discharged home with antinausea medication, OB/GYN follow-up, and strict return cautioned. She verbalized her understanding and agreement to today's plan had no further questions or concerns at time of discharge  Final Clinical Impressions(s) / ED Diagnoses   Final diagnoses:  Gastroenteritis  Cyst of left ovary   New Prescriptions Discharge Medication List as of  10/02/2016  9:12 PM    START taking these medications   Details  ondansetron (ZOFRAN) 4 MG tablet Take 1 tablet (4 mg total) by mouth every 6 (six) hours., Starting Wed 10/02/2016, Print    oxyCODONE-acetaminophen (PERCOCET/ROXICET) 5-325 MG tablet Take 1 tablet by mouth every 4 (four) hours as needed for severe pain., Starting Wed 10/02/2016, Print       I personally performed the services described in this documentation, which was scribed in my presence. The recorded information has been reviewed and is accurate.   Okey Regal, PA-C 10/02/16 2203    Veryl Speak, MD 10/02/16 2216

## 2016-10-02 NOTE — ED Triage Notes (Signed)
Pt reports 3 days of watery diarrhea and abdominal pain with a few episode of vomiting. Denies fever.

## 2016-10-02 NOTE — Discharge Instructions (Signed)
Please read attached information. If you experience any new or worsening signs or symptoms please return to the emergency room for evaluation. Please follow-up with your primary care provider or specialist as discussed. Please use medication prescribed only as directed and discontinue taking if you have any concerning signs or symptoms.   °

## 2017-03-30 HISTORY — PX: UMBILICAL HERNIA REPAIR: SHX196

## 2017-07-29 ENCOUNTER — Encounter (HOSPITAL_BASED_OUTPATIENT_CLINIC_OR_DEPARTMENT_OTHER): Payer: Self-pay

## 2017-07-29 ENCOUNTER — Emergency Department (HOSPITAL_BASED_OUTPATIENT_CLINIC_OR_DEPARTMENT_OTHER)
Admission: EM | Admit: 2017-07-29 | Discharge: 2017-07-30 | Disposition: A | Discharge status: Home / Self Care | Payer: Medicaid Other | Attending: Emergency Medicine | Admitting: Emergency Medicine

## 2017-07-29 DIAGNOSIS — M545 Low back pain, unspecified: Secondary | ICD-10-CM

## 2017-07-29 DIAGNOSIS — N760 Acute vaginitis: Secondary | ICD-10-CM | POA: Insufficient documentation

## 2017-07-29 DIAGNOSIS — E039 Hypothyroidism, unspecified: Secondary | ICD-10-CM | POA: Insufficient documentation

## 2017-07-29 DIAGNOSIS — B9689 Other specified bacterial agents as the cause of diseases classified elsewhere: Secondary | ICD-10-CM

## 2017-07-29 DIAGNOSIS — Z79899 Other long term (current) drug therapy: Secondary | ICD-10-CM | POA: Insufficient documentation

## 2017-07-29 HISTORY — DX: Reserved for concepts with insufficient information to code with codable children: IMO0002

## 2017-07-29 HISTORY — DX: Systemic lupus erythematosus, unspecified: M32.9

## 2017-07-29 LAB — URINALYSIS, ROUTINE W REFLEX MICROSCOPIC
BILIRUBIN URINE: NEGATIVE
Glucose, UA: NEGATIVE mg/dL
Hgb urine dipstick: NEGATIVE
Ketones, ur: NEGATIVE mg/dL
LEUKOCYTES UA: NEGATIVE
NITRITE: NEGATIVE
Protein, ur: NEGATIVE mg/dL
Specific Gravity, Urine: 1.02 (ref 1.005–1.030)
pH: 7.5 (ref 5.0–8.0)

## 2017-07-29 LAB — WET PREP, GENITAL
Sperm: NONE SEEN
Trich, Wet Prep: NONE SEEN
Yeast Wet Prep HPF POC: NONE SEEN

## 2017-07-29 LAB — PREGNANCY, URINE: Preg Test, Ur: NEGATIVE

## 2017-07-29 MED ORDER — METHOCARBAMOL 500 MG PO TABS
500.0000 mg | ORAL_TABLET | Freq: Once | ORAL | Status: AC
  Administered 2017-07-29: 500 mg via ORAL
  Filled 2017-07-29: qty 1

## 2017-07-29 MED ORDER — KETOROLAC TROMETHAMINE 60 MG/2ML IM SOLN
60.0000 mg | Freq: Once | INTRAMUSCULAR | Status: AC
  Administered 2017-07-29: 60 mg via INTRAMUSCULAR
  Filled 2017-07-29: qty 2

## 2017-07-29 NOTE — ED Notes (Signed)
Pt has been taking motrin with no relief

## 2017-07-29 NOTE — ED Triage Notes (Signed)
C/o "pain all over-feel stiff", "lupus rash" to face and cloudy urine-sx started yesterday-NAD-steady gait

## 2017-07-30 MED ORDER — ORPHENADRINE CITRATE ER 100 MG PO TB12: 100.0000 mg | ORAL_TABLET | Freq: Two times a day (BID) | ORAL | 0 refills | Status: DC

## 2017-07-30 MED ORDER — NAPROXEN 500 MG PO TABS: 500.0000 mg | ORAL_TABLET | Freq: Two times a day (BID) | ORAL | 0 refills | Status: DC

## 2017-07-30 MED ORDER — METRONIDAZOLE 0.75 % VA GEL: 1.0000 | Freq: Two times a day (BID) | VAGINAL | 0 refills | Status: DC

## 2017-07-31 LAB — GC/CHLAMYDIA PROBE AMP (~~LOC~~) NOT AT ARMC
Chlamydia: NEGATIVE
Neisseria Gonorrhea: NEGATIVE

## 2017-07-31 NOTE — ED Provider Notes (Signed)
[facility] Provider Note   CSN: [id number] Arrival date & time: 07/29/17  1924     History   Chief Complaint Chief Complaint  Patient presents with  . Generalized Body Aches    HPI Debbie [name] is a 36 y.o. female.  HPI Patient has lower right back pain that is aching in nature.  She reports she does have lupus and gets generally diffuse body aches and stiffness at times.  This pain is somewhat different.  She reports there is some suprapubic discomfort and mild urinary frequency.  No fevers no vomiting.  No abnormal vaginal discharge.  Patient denies suspicion of STD exposure.  No radiation of pain into her legs or weakness or numbness of the lower extremities. Past Medical History:  Diagnosis Date  . Anemia   . Hypothyroidism   . Lupus   . Migraine   . Migraines   . Thyroid disease   . UTI (urinary tract infection)   . Vaginal delivery 2002, 2009, 2015  . Varicose veins    Left > than right    Patient Active Problem List   Diagnosis Date Noted  . Chronic migraine w/o aura w/o status migrainosus, not intractable 02/13/2015  . Active labor 03/22/2014  . Varicose veins of leg with pain 02/16/2013  . Laryngitis, acute 04/28/2012  . Vomiting 04/28/2012  . Fever 04/28/2012  . Subconjunctival hemorrhage sm prob from vomiting 04/28/2012  . Anemia 01/27/2012  . Migraine 02/18/2011  . Allergic rhinitis 02/18/2011  . Hypothyroid 02/18/2011    Past Surgical History:  Procedure Laterality Date  . APPENDECTOMY    . DILATION AND EVACUATION N/A 04/27/2013   Procedure: DILATATION AND EVACUATION;  Surgeon: Daria Pastures, MD;  Location: [facility];  Service: Gynecology;  Laterality: N/A;  . LAPAROSCOPIC TUBAL LIGATION Bilateral 06/08/2014   Procedure: LAPAROSCOPIC TUBAL LIGATION;  Surgeon: Daria Pastures, MD;  Location: [facility];  Service: Gynecology;  Laterality: Bilateral;  . TONSILLECTOMY  2001  . TONSILLECTOMY    . TONSILLECTOMY AND  ADENOIDECTOMY      OB History    Gravida Para Term Preterm AB Living   6 3 3   3 3    SAB TAB Ectopic Multiple Live Births   2 1     3        Home Medications    Prior to Admission medications   Medication Sig Start Date End Date Taking? Authorizing Provider  hydroxychloroquine (PLAQUENIL) 200 MG tablet Take by mouth daily.   Yes [provider]  metroNIDAZOLE (METROGEL VAGINAL) 0.75 % vaginal gel Place 1 Applicatorful vaginally 2 (two) times daily. 07/30/17   Charlesetta Shanks, MD  naproxen (NAPROSYN) 500 MG tablet Take 1 tablet (500 mg total) by mouth 2 (two) times daily. 07/30/17   Charlesetta Shanks, MD  orphenadrine (NORFLEX) 100 MG tablet Take 1 tablet (100 mg total) by mouth 2 (two) times daily. 07/30/17   Charlesetta Shanks, MD  SYNTHROID 175 MCG tablet Take 175 mcg by mouth daily. 04/28/15   [provider]  Topiramate ER (TROKENDI XR) 50 MG CP24 Take 50 mg by mouth at bedtime. 05/28/16   Melvenia Beam, MD    Family History Family History  Problem Relation Age of Onset  . Hypertension Mother   . Hyperlipidemia Father   . Diabetes Father   . Hypertension Father   . Arthritis Neg Hx        family  . Migraines Neg Hx  Social History Social History  Substance Use Topics  . Smoking status: Never Smoker  . Smokeless tobacco: Never Used  . Alcohol use No     Allergies   Aspirin; Bactrim; and Sulfa antibiotics   Review of Systems Review of Systems 10 Systems reviewed and are negative for acute change except as noted in the HPI.  Physical Exam Updated Vital Signs BP 106/63 (BP Location: Right Arm)   Pulse 75   Temp 98 F (36.7 C) (Oral)   Resp 18   Ht 5\' 2"  (1.575 m)   Wt 77.9 kg (171 lb 11.8 oz)   SpO2 99%   BMI 31.41 kg/m   Physical Exam  Constitutional: She is oriented to person, place, and time. She appears well-developed and well-nourished. No distress.  HENT:  Head: Normocephalic and atraumatic.  Eyes: Conjunctivae and EOM are  normal.  Neck: Neck supple.  Cardiovascular: Normal rate, regular rhythm and normal heart sounds.   No murmur heard. Pulmonary/Chest: Effort normal and breath sounds normal. No respiratory distress.  Abdominal: Soft. There is no tenderness.  Mild suprapubic discomfort without guarding.  Genitourinary: Vagina normal and uterus normal.  Genitourinary Comments: Speculum no significant discharge from the cervix, mild white yellow discharge in vault.  No significant cervical motion tenderness.  Mild tenderness over the top of the uterus.  Adnexa are nontender without fullness.  Musculoskeletal: Normal range of motion. She exhibits no edema.  Patient endorses tenderness over the lower back on the right to palpation.  No CVA tenderness.  Neurological: She is alert and oriented to person, place, and time. No cranial nerve deficit. She exhibits normal muscle tone. Coordination normal.  Skin: Skin is warm and dry.  Psychiatric: She has a normal mood and affect.  Nursing note and vitals reviewed.    ED Treatments / Results  Labs (all labs ordered are listed, but only abnormal results are displayed) Labs Reviewed  WET PREP, GENITAL - Abnormal; Notable for the following:       Result Value   Clue Cells Wet Prep HPF POC PRESENT (*)    WBC, Wet Prep HPF POC MODERATE (*)    All other components within normal limits  PREGNANCY, URINE  URINALYSIS, ROUTINE W REFLEX MICROSCOPIC  GC/CHLAMYDIA PROBE AMP ([place]) NOT AT [facility]    EKG  EKG Interpretation None       Radiology No results found.  Procedures Procedures (including critical care time)  Medications Ordered in ED Medications  ketorolac (TORADOL) injection 60 mg (60 mg Intramuscular Given 07/29/17 2246)  methocarbamol (ROBAXIN) tablet 500 mg (500 mg Oral Given 07/29/17 2245)     Initial Impression / Assessment and Plan / ED Course  I have reviewed the triage vital signs and the nursing notes.  Pertinent labs & imaging  results that were available during my care of the patient were reviewed by me and considered in my medical decision making (see chart for details).      Final Clinical Impressions(s) / ED Diagnoses   Final diagnoses:  Bacterial vaginosis  Acute right-sided low back pain without sciatica  Patient has right lower back pain that is most suggestive of musculoskeletal etiology.  This is reproducible to palpation.  Patient does not have signs of UTI or blood in the urine.  She also endorses some suprapubic discomfort.  Pelvic examination without significant tenderness but positive for bacterial vaginosis.  Will opt to treat.  Patient appears to have a low risk for STD and does not  have significant pelvic pain.  Will not treat empirically will await culture results.  I doubt kidney stone given the reproducibility of the patient's pain without any blood in the urine.  I will treat empirically for pain and have the patient follow-up with PCP and GYN.  New Prescriptions Discharge Medication List as of 07/30/2017 12:20 AM    START taking these medications   Details  metroNIDAZOLE (METROGEL VAGINAL) 0.75 % vaginal gel Place 1 Applicatorful vaginally 2 (two) times daily., Starting Wed 07/30/2017, Print    naproxen (NAPROSYN) 500 MG tablet Take 1 tablet (500 mg total) by mouth 2 (two) times daily., Starting Wed 07/30/2017, Print    orphenadrine (NORFLEX) 100 MG tablet Take 1 tablet (100 mg total) by mouth 2 (two) times daily., Starting Wed 07/30/2017, Print         Charlesetta Shanks, MD 07/31/17 [phone]

## 2017-10-28 ENCOUNTER — Emergency Department (HOSPITAL_BASED_OUTPATIENT_CLINIC_OR_DEPARTMENT_OTHER): Payer: BLUE CROSS/BLUE SHIELD

## 2017-10-28 ENCOUNTER — Encounter (HOSPITAL_BASED_OUTPATIENT_CLINIC_OR_DEPARTMENT_OTHER): Payer: Self-pay | Admitting: Emergency Medicine

## 2017-10-28 ENCOUNTER — Other Ambulatory Visit: Payer: Self-pay

## 2017-10-28 ENCOUNTER — Emergency Department (HOSPITAL_BASED_OUTPATIENT_CLINIC_OR_DEPARTMENT_OTHER)
Admission: EM | Admit: 2017-10-28 | Discharge: 2017-10-28 | Disposition: A | Discharge status: Home / Self Care | Payer: BLUE CROSS/BLUE SHIELD | Attending: Emergency Medicine | Admitting: Emergency Medicine

## 2017-10-28 DIAGNOSIS — J069 Acute upper respiratory infection, unspecified: Secondary | ICD-10-CM | POA: Insufficient documentation

## 2017-10-28 DIAGNOSIS — J019 Acute sinusitis, unspecified: Secondary | ICD-10-CM | POA: Diagnosis not present

## 2017-10-28 DIAGNOSIS — E039 Hypothyroidism, unspecified: Secondary | ICD-10-CM | POA: Diagnosis not present

## 2017-10-28 DIAGNOSIS — B9789 Other viral agents as the cause of diseases classified elsewhere: Secondary | ICD-10-CM

## 2017-10-28 DIAGNOSIS — J310 Chronic rhinitis: Secondary | ICD-10-CM

## 2017-10-28 DIAGNOSIS — Z79899 Other long term (current) drug therapy: Secondary | ICD-10-CM | POA: Diagnosis not present

## 2017-10-28 DIAGNOSIS — R05 Cough: Secondary | ICD-10-CM | POA: Diagnosis present

## 2017-10-28 DIAGNOSIS — J329 Chronic sinusitis, unspecified: Secondary | ICD-10-CM

## 2017-10-28 MED ORDER — BUDESONIDE 32 MCG/ACT NA SUSP: 1.0000 | Freq: Every day | NASAL | 0 refills | Status: DC

## 2017-10-28 MED ORDER — SALINE SPRAY 0.65 % NA SOLN: 1.0000 | NASAL | 0 refills | Status: DC | PRN

## 2017-10-28 MED ORDER — BENZONATATE 100 MG PO CAPS: 100.0000 mg | ORAL_CAPSULE | Freq: Three times a day (TID) | ORAL | 0 refills | Status: DC

## 2017-10-28 NOTE — ED Triage Notes (Addendum)
Patient states that she has had intermittent coughing and nasal congestion  generalized aches x 1 week. The patient reports that she was at work about an hour ago and started to have some chest pain and tightness, followed by increased nausea  - pateint has a dry non productive cough in triage

## 2017-10-28 NOTE — ED Provider Notes (Signed)
[facility]PART[facility] Provider Note   CSN: [id number] Arrival date & time: 10/28/17  1449     History   Chief Complaint Chief Complaint  Patient presents with  . Cough    HPI Debbie [name] is a 37 y.o. female.  Patient with URI symptoms x 1 week. Occasional fever/chills. Persistent cough, with occasional post-tussive emesis. Denies abdominal pain, diarrhea. Has been using OTC medications with some improvement in symptoms.   The history is provided by the patient. No language interpreter was used.  Cough  This is a new problem. The current episode started more than 2 days ago. The problem occurs every few minutes. The problem has been gradually worsening. The cough is productive of sputum. The maximum temperature recorded prior to her arrival was 100 to 100.9 F. Pertinent negatives include no shortness of breath. She has tried decongestants and cough syrup for the symptoms. The treatment provided mild relief.    Past Medical History:  Diagnosis Date  . Anemia   . Hypothyroidism   . Lupus   . Migraine   . Migraines   . Thyroid disease   . UTI (urinary tract infection)   . Vaginal delivery 2002, 2009, 2015  . Varicose veins    Left > than right    Patient Active Problem List   Diagnosis Date Noted  . Chronic migraine w/o aura w/o status migrainosus, not intractable 02/13/2015  . Active labor 03/22/2014  . Varicose veins of leg with pain 02/16/2013  . Laryngitis, acute 04/28/2012  . Vomiting 04/28/2012  . Fever 04/28/2012  . Subconjunctival hemorrhage sm prob from vomiting 04/28/2012  . Anemia 01/27/2012  . Migraine 02/18/2011  . Allergic rhinitis 02/18/2011  . Hypothyroid 02/18/2011    Past Surgical History:  Procedure Laterality Date  . APPENDECTOMY    . DILATION AND EVACUATION N/A 04/27/2013   Procedure: DILATATION AND EVACUATION;  Surgeon: Daria Pastures, MD;  Location: [facility];  Service: Gynecology;  Laterality: N/A;  . LAPAROSCOPIC  TUBAL LIGATION Bilateral 06/08/2014   Procedure: LAPAROSCOPIC TUBAL LIGATION;  Surgeon: Daria Pastures, MD;  Location: [facility];  Service: Gynecology;  Laterality: Bilateral;  . TONSILLECTOMY  2001  . TONSILLECTOMY    . TONSILLECTOMY AND ADENOIDECTOMY      OB History    Gravida Para Term Preterm AB Living   6 3 3   3 3    SAB TAB Ectopic Multiple Live Births   2 1     3        Home Medications    Prior to Admission medications   Medication Sig Start Date End Date Taking? Authorizing Provider  hydroxychloroquine (PLAQUENIL) 200 MG tablet Take by mouth daily.    [provider]  metroNIDAZOLE (METROGEL VAGINAL) 0.75 % vaginal gel Place 1 Applicatorful vaginally 2 (two) times daily. 07/30/17   Charlesetta Shanks, MD  naproxen (NAPROSYN) 500 MG tablet Take 1 tablet (500 mg total) by mouth 2 (two) times daily. 07/30/17   Charlesetta Shanks, MD  orphenadrine (NORFLEX) 100 MG tablet Take 1 tablet (100 mg total) by mouth 2 (two) times daily. 07/30/17   Charlesetta Shanks, MD  SYNTHROID 175 MCG tablet Take 175 mcg by mouth daily. 04/28/15   [provider]  Topiramate ER (TROKENDI XR) 50 MG CP24 Take 50 mg by mouth at bedtime. 05/28/16   Melvenia Beam, MD    Family History Family History  Problem Relation Age of Onset  . Hypertension Mother   . Hyperlipidemia  Father   . Diabetes Father   . Hypertension Father   . Arthritis Neg Hx        family  . Migraines Neg Hx     Social History Social History   Tobacco Use  . Smoking status: Never Smoker  . Smokeless tobacco: Never Used  Substance Use Topics  . Alcohol use: No  . Drug use: No     Allergies   Aspirin; Bactrim; and Sulfa antibiotics   Review of Systems Review of Systems  HENT: Positive for congestion, sinus pressure and sinus pain.   Respiratory: Positive for cough. Negative for shortness of breath.   All other systems reviewed and are negative.    Physical Exam Updated Vital Signs BP 113/84   Pulse  79   Temp 98.2 F (36.8 C) (Oral)   Resp 18   Ht 5\' 2"  (1.575 m)   Wt 76.7 kg (169 lb)   LMP 09/27/2017   SpO2 100%   BMI 30.91 kg/m   Physical Exam  Constitutional: She is oriented to person, place, and time. She appears well-developed and well-nourished.  HENT:  Head: Normocephalic.  Nose: Mucosal edema present. Right sinus exhibits maxillary sinus tenderness and frontal sinus tenderness. Left sinus exhibits maxillary sinus tenderness and frontal sinus tenderness.  Mouth/Throat: Oropharynx is clear and moist.  Eyes: Conjunctivae are normal.  Neck: Neck supple.  Cardiovascular: Normal rate and regular rhythm.  Pulmonary/Chest: Effort normal and breath sounds normal.  Abdominal: Soft. Bowel sounds are normal.  Musculoskeletal: Normal range of motion. She exhibits no edema.  Lymphadenopathy:    She has no cervical adenopathy.  Neurological: She is alert and oriented to person, place, and time.  Skin: Skin is warm and dry.  Psychiatric: She has a normal mood and affect.  Nursing note and vitals reviewed.    ED Treatments / Results  Labs (all labs ordered are listed, but only abnormal results are displayed) Labs Reviewed - No data to display  EKG  EKG Interpretation  Date/Time:  Tuesday October 28 2017 14:57:18 EST Ventricular Rate:  88 PR Interval:  140 QRS Duration: 68 QT Interval:  342 QTC Calculation: 413 R Axis:   47 Text Interpretation:  Normal sinus rhythm Low voltage QRS Septal infarct , age undetermined Abnormal ECG No prior ECG for comparison.  No STEMI Confirmed by Antony Blackbird [phone]) on 10/28/2017 5:06:24 PM       Radiology Dg Chest 2 View  Result Date: 10/28/2017 CLINICAL DATA:  37 year old female with substernal chest pain. Cough, congestion, shortness of breath and fever over the past week. Initial encounter. EXAM: CHEST  2 VIEW COMPARISON:  10/02/2016 chest x-ray. FINDINGS: No infiltrate, congestive heart failure or pneumothorax. No plain film  evidence of pulmonary malignancy. Heart size within normal limits. No acute osseous abnormality. IMPRESSION: No active cardiopulmonary disease. Electronically Signed   By: Genia Del M.D.   On: 10/28/2017 15:31    Procedures Procedures (including critical care time)  Medications Ordered in ED Medications - No data to display   Initial Impression / Assessment and Plan / ED Course  I have reviewed the triage vital signs and the nursing notes.  Pertinent labs & imaging results that were available during my care of the patient were reviewed by me and considered in my medical decision making (see chart for details).     Pt symptoms consistent with URI. CXR negative for acute infiltrate. Pt will be discharged with symptomatic treatment.  Discussed return precautions.  Pt is hemodynamically stable & in NAD prior to discharge.  Final Clinical Impressions(s) / ED Diagnoses   Final diagnoses:  Viral URI with cough  Rhinosinusitis    ED Discharge Orders        Ordered    budesonide (RHINOCORT AQUA) 32 MCG/ACT nasal spray  Daily     10/28/17 1926    benzonatate (TESSALON) 100 MG capsule  Every 8 hours     10/28/17 1926    sodium chloride (OCEAN) 0.65 % SOLN nasal spray  As needed     10/28/17 1926       Etta Quill, NP 10/29/17 0022    Tegeler, Gwenyth Allegra, MD 10/29/17 [phone]

## 2017-10-28 NOTE — ED Notes (Signed)
Pt discharged to home with family. NAD.  

## 2018-04-06 ENCOUNTER — Emergency Department (HOSPITAL_BASED_OUTPATIENT_CLINIC_OR_DEPARTMENT_OTHER): Payer: BLUE CROSS/BLUE SHIELD

## 2018-04-06 ENCOUNTER — Emergency Department (HOSPITAL_BASED_OUTPATIENT_CLINIC_OR_DEPARTMENT_OTHER)
Admission: EM | Admit: 2018-04-06 | Discharge: 2018-04-06 | Disposition: A | Discharge status: Home / Self Care | Payer: BLUE CROSS/BLUE SHIELD | Attending: Emergency Medicine | Admitting: Emergency Medicine

## 2018-04-06 ENCOUNTER — Other Ambulatory Visit: Payer: Self-pay

## 2018-04-06 ENCOUNTER — Encounter (HOSPITAL_BASED_OUTPATIENT_CLINIC_OR_DEPARTMENT_OTHER): Payer: Self-pay

## 2018-04-06 DIAGNOSIS — M7989 Other specified soft tissue disorders: Secondary | ICD-10-CM

## 2018-04-06 DIAGNOSIS — E039 Hypothyroidism, unspecified: Secondary | ICD-10-CM | POA: Diagnosis not present

## 2018-04-06 DIAGNOSIS — R2242 Localized swelling, mass and lump, left lower limb: Secondary | ICD-10-CM | POA: Diagnosis present

## 2018-04-06 DIAGNOSIS — Z79899 Other long term (current) drug therapy: Secondary | ICD-10-CM | POA: Diagnosis not present

## 2018-04-06 LAB — CBC WITH DIFFERENTIAL/PLATELET
BASOS PCT: 1 %
Basophils Absolute: 0.1 10*3/uL (ref 0.0–0.1)
EOS PCT: 4 %
Eosinophils Absolute: 0.2 10*3/uL (ref 0.0–0.7)
HEMATOCRIT: 32.3 % — AB (ref 36.0–46.0)
HEMOGLOBIN: 11.5 g/dL — AB (ref 12.0–15.0)
LYMPHS PCT: 38 %
Lymphs Abs: 2.4 10*3/uL (ref 0.7–4.0)
MCH: 23.7 pg — AB (ref 26.0–34.0)
MCHC: 35.6 g/dL (ref 30.0–36.0)
MCV: 66.5 fL — AB (ref 78.0–100.0)
Monocytes Absolute: 0.9 10*3/uL (ref 0.1–1.0)
Monocytes Relative: 14 %
NEUTROS ABS: 2.6 10*3/uL (ref 1.7–7.7)
NEUTROS PCT: 43 %
PLATELETS: 333 10*3/uL (ref 150–400)
RBC: 4.86 MIL/uL (ref 3.87–5.11)
RDW: 15.7 % — AB (ref 11.5–15.5)
WBC: 6.2 10*3/uL (ref 4.0–10.5)

## 2018-04-06 LAB — BASIC METABOLIC PANEL
Anion gap: 5 (ref 5–15)
BUN: 16 mg/dL (ref 6–20)
CALCIUM: 8.3 mg/dL — AB (ref 8.9–10.3)
CHLORIDE: 106 mmol/L (ref 98–111)
CO2: 26 mmol/L (ref 22–32)
Creatinine, Ser: 0.83 mg/dL (ref 0.44–1.00)
GFR calc non Af Amer: >60 mL/min (ref >60)
GLUCOSE: 86 mg/dL (ref 70–99)
Potassium: 3.8 mmol/L (ref 3.5–5.1)
Sodium: 137 mmol/L (ref 135–145)

## 2018-04-06 LAB — TROPONIN I: Troponin I: <0.03 ng/mL (ref <0.03)

## 2018-04-06 LAB — PREGNANCY, URINE: Preg Test, Ur: NEGATIVE

## 2018-04-06 NOTE — ED Provider Notes (Signed)
[name] EMERGENCY DEPARTMENT Provider Note   CSN: [id number] Arrival date & time: 04/06/18  1759     History   Chief Complaint No chief complaint on file.   HPI De[name] is a 37 y.o. female.  37 year old female with past medical history including lupus, thyroid disease, migraines who presents with left leg swelling.  The patient drove with her family to [place] a [place] over 1 week ago.  5 days ago, she began noticing left leg swelling.  She has tried to rest her leg and elevate it without improvement.  This morning she noticed a Deneen Slager bit of right leg swelling but her left leg is worse.  No severe pain.  She denies any history of blood clots or cancer.  Of note, the patient was evaluated by her PCP 1 month ago and had an EKG that she was told was abnormal.  She has been referred to cardiology and has an appointment at the end of this month.  She had presented to her PCP because she was having a few second episodes of central chest squeezing that are nonexertional with no associated nausea, vomiting, diaphoresis, or shortness of breath.  The episodes happen randomly every few days and she denies any pleuritic chest pain.  No sharp pain.  These episodes started several weeks prior to her trip. She has had no new chest pain or change in her symptoms since her trip.  The history is provided by the patient.    Past Medical History:  Diagnosis Date  . Anemia   . Hypothyroidism   . Lupus ([place])   . Migraine   . Migraines   . Thyroid disease   . UTI (urinary tract infection)   . Vaginal delivery 2002, 2009, 2015  . Varicose veins    Left > than right    Patient Active Problem List   Diagnosis Date Noted  . Chronic migraine w/o aura w/o status migrainosus, not intractable 02/13/2015  . Active labor 03/22/2014  . Varicose veins of leg with pain 02/16/2013  . Laryngitis, acute 04/28/2012  . Vomiting 04/28/2012  . Fever 04/28/2012  . Subconjunctival hemorrhage sm  prob from vomiting 04/28/2012  . Anemia 01/27/2012  . Migraine 02/18/2011  . Allergic rhinitis 02/18/2011  . Hypothyroid 02/18/2011    Past Surgical History:  Procedure Laterality Date  . APPENDECTOMY    . DILATION AND EVACUATION N/A 04/27/2013   Procedure: DILATATION AND EVACUATION;  Surgeon: Daria Pastures, MD;  Location: [facility];  Service: Gynecology;  Laterality: N/A;  . LAPAROSCOPIC TUBAL LIGATION Bilateral 06/08/2014   Procedure: LAPAROSCOPIC TUBAL LIGATION;  Surgeon: Daria Pastures, MD;  Location: [facility];  Service: Gynecology;  Laterality: Bilateral;  . TONSILLECTOMY  2001  . TONSILLECTOMY    . TONSILLECTOMY AND ADENOIDECTOMY       OB History    Gravida  6   Para  3   Term  3   Preterm      AB  3   Living  3     SAB  2   TAB  1   Ectopic      Multiple      Live Births  3            Home Medications    Prior to Admission medications   Medication Sig Start Date End Date Taking? Authorizing Provider  benzonatate (TESSALON) 100 MG capsule Take 1 capsule (100 mg total) by mouth every 8 (eight) hours. 10/28/17  Etta Quill, NP  budesonide (RHINOCORT AQUA) 32 MCG/ACT nasal spray Place 1 spray into both nostrils daily. 10/28/17   Etta Quill, NP  hydroxychloroquine (PLAQUENIL) 200 MG tablet Take by mouth daily.    [provider]  metroNIDAZOLE (METROGEL VAGINAL) 0.75 % vaginal gel Place 1 Applicatorful vaginally 2 (two) times daily. 07/30/17   Charlesetta Shanks, MD  naproxen (NAPROSYN) 500 MG tablet Take 1 tablet (500 mg total) by mouth 2 (two) times daily. 07/30/17   Charlesetta Shanks, MD  orphenadrine (NORFLEX) 100 MG tablet Take 1 tablet (100 mg total) by mouth 2 (two) times daily. 07/30/17   Charlesetta Shanks, MD  sodium chloride (OCEAN) 0.65 % SOLN nasal spray Place 1 spray into both nostrils as needed for congestion. 10/28/17   Etta Quill, NP  SYNTHROID 175 MCG tablet Take 175 mcg by mouth daily. 04/28/15   [provider]    Topiramate ER (TROKENDI XR) 50 MG CP24 Take 50 mg by mouth at bedtime. 05/28/16   Melvenia Beam, MD    Family History Family History  Problem Relation Age of Onset  . Hypertension Mother   . Hyperlipidemia Father   . Diabetes Father   . Hypertension Father   . Arthritis Neg Hx        family  . Migraines Neg Hx     Social History Social History   Tobacco Use  . Smoking status: Never Smoker  . Smokeless tobacco: Never Used  Substance Use Topics  . Alcohol use: No  . Drug use: No     Allergies   Aspirin; Bactrim; and Sulfa antibiotics   Review of Systems Review of Systems All other systems reviewed and are negative except that which was mentioned in HPI   Physical Exam Updated Vital Signs BP 102/67 (BP Location: Left Arm)   Pulse 70   Temp 98.3 F (36.8 C) (Oral)   Resp 18   Ht 5\' 2"  (1.575 m)   Wt 77.1 kg (170 lb)   LMP 03/17/2018   SpO2 99%   BMI 31.09 kg/m   Physical Exam  Constitutional: She is oriented to person, place, and time. She appears well-developed and well-nourished. No distress.  HENT:  Head: Normocephalic and atraumatic.  Moist mucous membranes  Eyes: Conjunctivae are normal.  Neck: Neck supple.  Cardiovascular: Normal rate, regular rhythm and normal heart sounds.  No murmur heard. Pulmonary/Chest: Effort normal and breath sounds normal.  Abdominal: Soft. Bowel sounds are normal. She exhibits no distension. There is no tenderness.  Musculoskeletal: She exhibits edema. She exhibits no tenderness.  Very mild L lower leg swelling compared to R  Neurological: She is alert and oriented to person, place, and time.  Fluent speech  Skin: Skin is warm and dry.  Psychiatric: She has a normal mood and affect. Judgment normal.  Nursing note and vitals reviewed.    ED Treatments / Results  Labs (all labs ordered are listed, but only abnormal results are displayed) Labs Reviewed  BASIC METABOLIC PANEL - Abnormal; Notable for the following  components:      Result Value   Calcium 8.3 (*)    All other components within normal limits  CBC WITH DIFFERENTIAL/PLATELET - Abnormal; Notable for the following components:   Hemoglobin 11.5 (*)    HCT 32.3 (*)    MCV 66.5 (*)    MCH 23.7 (*)    RDW 15.7 (*)    All other components within normal limits  PREGNANCY, URINE  TROPONIN I  EKG EKG Interpretation  Date/Time:  Monday April 06 2018 18:47:55 EDT Ventricular Rate:  74 PR Interval:    QRS Duration: 87 QT Interval:  375 QTC Calculation: 416 R Axis:   53 Text Interpretation:  Sinus rhythm Low voltage, precordial leads low voltages, otherwise unremarkable Confirmed by Theotis Burrow [phone]) on 04/06/2018 6:59:50 PM   Radiology Dg Chest 2 View  Result Date: 04/06/2018 CLINICAL DATA:  37 year old female with chest pain for 1 month. Cough and shortness of breath with left leg swelling for 1 day. EXAM: CHEST - 2 VIEW COMPARISON:  Chest radiographs 10/28/2017 and earlier. FINDINGS: Lung volumes and mediastinal contours remain normal. Visualized tracheal air column is within normal limits. The lungs appear stable and clear with no pneumothorax or pleural effusion. Negative visible osseous structures and bowel gas pattern. IMPRESSION: Negative.  No acute cardiopulmonary abnormality. Electronically Signed   By: Genevie Ann M.D.   On: 04/06/2018 20:18   US Venous Img Lower  Left (dvt Study)  Result Date: 04/06/2018 CLINICAL DATA:  LEFT lower extremity pain and swelling for 5 days, recent trip to [place], history lupus EXAM: LEFT LOWER EXTREMITY VENOUS DOPPLER ULTRASOUND TECHNIQUE: Gray-scale sonography with graded compression, as well as color Doppler and duplex ultrasound were performed to evaluate the lower extremity deep venous systems from the level of the common femoral vein and including the common femoral, femoral, profunda femoral, popliteal and calf veins including the posterior tibial, peroneal and gastrocnemius veins when visible.  The superficial great saphenous vein was also interrogated. Spectral Doppler was utilized to evaluate flow at rest and with distal augmentation maneuvers in the common femoral, femoral and popliteal veins. COMPARISON:  None FINDINGS: Contralateral Common Femoral Vein: Respiratory phasicity is normal and symmetric with the symptomatic side. No evidence of thrombus. Normal compressibility. Common Femoral Vein: No evidence of thrombus. Normal compressibility, respiratory phasicity and response to augmentation. Saphenofemoral Junction: No evidence of thrombus. Normal compressibility and flow on color Doppler imaging. Profunda Femoral Vein: No evidence of thrombus. Normal compressibility and flow on color Doppler imaging. Femoral Vein: No evidence of thrombus. Normal compressibility, respiratory phasicity and response to augmentation. Popliteal Vein: No evidence of thrombus. Normal compressibility, respiratory phasicity and response to augmentation. Calf Veins: No evidence of thrombus. Normal compressibility and flow on color Doppler imaging. Superficial Great Saphenous Vein: No evidence of thrombus. Normal compressibility. Venous Reflux:  None. Other Findings:  None. IMPRESSION: No evidence of deep venous thrombosis in the LEFT lower extremity. Electronically Signed   By: Lavonia Dana M.D.   On: 04/06/2018 20:40    Procedures Procedures (including critical care time)  Medications Ordered in ED Medications - No data to display   Initial Impression / Assessment and Plan / ED Course  I have reviewed the triage vital signs and the nursing notes.  Pertinent labs & imaging results that were available during my care of the patient were reviewed by me and considered in my medical decision making (see chart for details).    She was well-appearing and comfortable on exam, normal vital signs.  EKG was unremarkable, it is unclear what her PCP was concerned about that prompted cardiology referral.  Lab work here is  unremarkable including negative troponin.  Because her intermittent non-exertional chest squeezing has been going on for months and she has reassuring cardiac work-up, I do not feel she needs any further cardiac work-up especially considering she is following up with cardiology soon.  Ultrasound today is negative for DVT.  I explained  that if she continues to have swelling, she should follow-up with PCP for repeat ultrasound in 5 to 7 days. Her intermittent chest squeezing sensation preceded leg swelling by several months, I doubt PE based on sx description and normal VS.    I have discussed supportive measures including elevation, compression stockings, low-salt diet.  Reviewed return precautions and she voiced understanding.  Final Clinical Impressions(s) / ED Diagnoses   Final diagnoses:  Left leg swelling    ED Discharge Orders    None       Nochum Fenter, Wenda Overland, MD 04/07/18 [phone]

## 2018-04-06 NOTE — ED Notes (Signed)
ED Provider at bedside. 

## 2018-04-06 NOTE — ED Triage Notes (Signed)
Pt reports swelling to left leg x 1 week. Pt had recent travel to [place]. Pt saw PMP and had abnormal EKG- has not seen cardiologist yet.

## 2018-04-06 NOTE — Discharge Instructions (Addendum)
RETURN TO ER IF YOU HAVE WORSENING SWELLING, LEG PAIN, CHEST PAIN, OR BREATHING PROBLEMS.

## 2018-07-28 ENCOUNTER — Encounter: Payer: Self-pay | Admitting: Neurology

## 2018-07-28 ENCOUNTER — Ambulatory Visit (INDEPENDENT_AMBULATORY_CARE_PROVIDER_SITE_OTHER): Payer: BLUE CROSS/BLUE SHIELD | Admitting: Neurology

## 2018-07-28 VITALS — BP 108/64 | HR 76 | Ht 62.0 in | Wt 179.0 lb

## 2018-07-28 DIAGNOSIS — G43011 Migraine without aura, intractable, with status migrainosus: Secondary | ICD-10-CM | POA: Diagnosis not present

## 2018-07-28 MED ORDER — RIZATRIPTAN BENZOATE 10 MG PO TBDP: 10.0000 mg | ORAL_TABLET | ORAL | 11 refills | Status: DC | PRN

## 2018-07-28 MED ORDER — ONDANSETRON 4 MG PO TBDP: 4.0000 mg | ORAL_TABLET | Freq: Three times a day (TID) | ORAL | 3 refills | Status: DC | PRN

## 2018-07-28 NOTE — Progress Notes (Signed)
[facility]    Provider:  Dr Jaynee Eagles Referring Provider: Thea Silversmith, Dianna Rossetti, MD Primary Care Physician:  Thurman Coyer, MD  CC: Migraines  Interval history 07/28/2018: She started having migraines again. The migraines are unilateral, radiates to the neck. Pulsating and throbbing. +light and sound sensitivity. Movement makes it worse. Can be severe. The headaches wake her up. Have been worsening. She was recently diagnosed with Lupus and takes injections of prednisone as needed, infusions every 4 weeks of her infusion. She has had the headache since 430am. It has been ongoing for several weeks.   Tried: Nortriptyline, Topamax and Trokendi, flexeril, zonisamide, patient stopped taking most of these without informing physician.  Interval history 05/28/2016: Has not been seen for close to a year. She was diagnosed with discoid lupus. She stopped taking the trokendi in May. She is having migraines every other day. Trokendi was working. She did not tolerate the Topiramate but Trokendi was fine and helped migraines. She has worsening migraines and dizziness and vision changes with blurry vision. Migraines are worsening, every other day, can be severe. Headaches are in the fron of the head, or all on the right or left, lught sensitivity, nausea, vomiting. They can last the whole day. Discussed imaging again, MRI of the brain given worsening headaches, vision changes, ongoing headaches for years.  Tried: Nortriptyline, Topamax and Trokendi, flexeril, zonisamide, patient stopped taking most of these without informing physician.   Interval update 06/12/2015: Headaches are getting worse. 2-3x a day. Will start Topamax. She is worried that something is wrong. She has blurry vision, vision changes. They are severe. The Nortriptyline didn't help at all. She wants to go back on Topamax. Discussed Trokendi, maybe she will tolerate it better. It is taken at night. She had to sleep with a cool rag  over her head last night due to the migraine pain. She is also having dizziness.     Interval history: She is having headaches several times a week. headaches last all day. Flexeril helped but makes her too sleepy. She stopped Topamax due to intolerance. Discussed that we can try different medication, will try Nortriptyline at night and zofran for nausea. Relpax for acute management with zofran for nausea.   HPI: Cristi Belcourt is a 37 y.o. female here as a referral from the hospital for Headaches. She has migraines for years since teens. She is having her migraines 3-4 times a week. She has throbbing on the right side, whole right side to the neck. She has light sensitivity, sound sensitivity, nausea. Has vomited. A dark room helps. She gets dizziness with the headaches. No vision or hearing changes. No other focal neurologic symptoms. No aura. She has been taking OTC medications which don' t help. She is taking too many motrins. Migraines can last all day and worsening. Can get to be 10/10 pain. She has had tubal ligation. No known triggers.   Review of Systems: Patient complains of symptoms per HPI as well as the following symptoms: nausea, headache, memory loss, joint pain. Pertinent negatives per HPI. All others negative.    Social History   Socioeconomic History  . Marital status: Single    Spouse name: Not on file  . Number of children: 3  . Years of education: College   . Highest education level: Not on file  Occupational History  . Not on file  Social Needs  . Financial resource strain: Not on file  . Food insecurity:    Worry: Not on  file    Inability: Not on file  . Transportation needs:    Medical: Not on file    Non-medical: Not on file  Tobacco Use  . Smoking status: Never Smoker  . Smokeless tobacco: Never Used  Substance and Sexual Activity  . Alcohol use: No  . Drug use: No  . Sexual activity: Not on file  Lifestyle  . Physical activity:    Days per week:  Not on file    Minutes per session: Not on file  . Stress: Not on file  Relationships  . Social connections:    Talks on phone: Not on file    Gets together: Not on file    Attends religious service: Not on file    Active member of club or organization: Not on file    Attends meetings of clubs or organizations: Not on file    Relationship status: Not on file  . Intimate partner violence:    Fear of current or ex partner: Not on file    Emotionally abused: Not on file    Physically abused: Not on file    Forced sexual activity: Not on file  Other Topics Concern  . Not on file  Social History Narrative   Lives at home with her children   Caffeine use: Drinks 1 cup coffee per day   Right handed    Family History  Problem Relation Age of Onset  . Hypertension Mother   . Hyperlipidemia Father   . Diabetes Father   . Hypertension Father   . Arthritis Neg Hx        family  . Migraines Neg Hx     Past Medical History:  Diagnosis Date  . Anemia   . Chest pain   . Hypothyroidism   . Lupus ([place])   . Migraine   . Migraines   . Systemic lupus ([place])   . Thyroid disease   . UTI (urinary tract infection)   . Vaginal delivery 2002, 2009, 2015  . Varicose veins    Left > than right    Past Surgical History:  Procedure Laterality Date  . APPENDECTOMY    . DILATION AND EVACUATION N/A 04/27/2013   Procedure: DILATATION AND EVACUATION;  Surgeon: Daria Pastures, MD;  Location: [facility];  Service: Gynecology;  Laterality: N/A;  . LAPAROSCOPIC TUBAL LIGATION Bilateral 06/08/2014   Procedure: LAPAROSCOPIC TUBAL LIGATION;  Surgeon: Daria Pastures, MD;  Location: [facility];  Service: Gynecology;  Laterality: Bilateral;  . TONSILLECTOMY  2001  . TONSILLECTOMY    . TONSILLECTOMY AND ADENOIDECTOMY    . UMBILICAL HERNIA REPAIR  03/2017   x2    Current Outpatient Medications  Medication Sig Dispense Refill  . Belimumab (BENLYSTA IV) Inject into the vein. Every 4 weeks.    .  cephALEXin (KEFLEX) 500 MG capsule Take 500 mg by mouth 2 (two) times daily. Finish on 07/28/18    . Cholecalciferol (VITAMIN D PO) Take by mouth daily.    Marland Kitchen levothyroxine (SYNTHROID, LEVOTHROID) 150 MCG tablet Take 150 mcg by mouth daily before breakfast.    . Multiple Vitamins-Minerals (MULTIVITAMIN PO) Take by mouth daily.    . traMADol (ULTRAM) 50 MG tablet Take 50 mg by mouth as needed.    . ondansetron (ZOFRAN-ODT) 4 MG disintegrating tablet Take 1 tablet (4 mg total) by mouth every 8 (eight) hours as needed for nausea. 30 tablet 3  . rizatriptan (MAXALT-MLT) 10 MG disintegrating tablet Take 1 tablet (10  mg total) by mouth as needed for migraine. May repeat in 2 hours if needed 9 tablet 11   No current facility-administered medications for this visit.     Allergies as of 07/28/2018 - Review Complete 07/28/2018  Allergen Reaction Noted  . Aspirin Hives 02/18/2011  . Bactrim Swelling and Rash 10/24/2011  . Sulfa antibiotics Swelling and Rash 12/20/2011    Vitals: BP 108/64 (BP Location: Right Arm, Patient Position: Sitting)   Pulse 76   Ht 5\' 2"  (1.575 m)   Wt 179 lb (81.2 kg)   LMP 07/10/2018 (Within Days)   BMI 32.74 kg/m  Last Weight:  Wt Readings from Last 1 Encounters:  07/28/18 179 lb (81.2 kg)   Last Height:   Ht Readings from Last 1 Encounters:  07/28/18 5\' 2"  (1.575 m)    Physical exam: Exam: Gen: NAD, conversant, well nourised, obese, well groomed                     CV: RRR, no MRG. No Carotid Bruits. No peripheral edema, warm, nontender Eyes: Conjunctivae clear without exudates or hemorrhage  Neuro: Detailed Neurologic Exam  Speech:    Speech is normal; fluent and spontaneous with normal comprehension.  Cognition:    The patient is oriented to person, place, and time;     recent and remote memory intact;     language fluent;     normal attention, concentration,     fund of knowledge Cranial Nerves:    The pupils are equal, round, and reactive to  light. The fundi are normal and spontaneous venous pulsations are present. Visual fields are full to finger confrontation. Extraocular movements are intact. Trigeminal sensation is intact and the muscles of mastication are normal. The face is symmetric. The palate elevates in the midline. Hearing intact. Voice is normal. Shoulder shrug is normal. The tongue has normal motion without fasciculations.   Coordination:    Normal finger to nose and heel to shin. Normal rapid alternating movements.   Gait:    Heel-toe and tandem gait are normal.   Motor Observation:    No asymmetry, no atrophy, and no involuntary movements noted. Tone:    Normal muscle tone.    Posture:    Posture is normal. normal erect    Strength:    Strength is V/V in the upper and lower limbs.      Sensation: intact to LT     Reflex Exam:  DTR's:    Deep tendon reflexes in the upper and lower extremities are normal bilaterally.   Toes:    The toes are downgoing bilaterally.   Clonus:    Clonus is absent.     Assessment/Plan: 37 year old patient with prior chronic migraines, without aura, not intractable. She has been doing well over the last few weeks has had an intractable migraine. May be due to new infusion for her Lupus, unclear.  - Will try and break migraine with infusion.  - If migraines continue discussed options, medications like Trokendi or the new CGRPs(would have to discuss with rheumatology but I dont see any contraindication to cgrp) - Acutely: Maxalt and Zofran - If continues with intractable migraines or red flags (fever, focal motor deficits, positional headache, vision changes etc) needs MRI brain w/wo - she will email if migraines continue  Meds ordered this encounter  Medications  . rizatriptan (MAXALT-MLT) 10 MG disintegrating tablet    Sig: Take 1 tablet (10 mg total) by mouth as  needed for migraine. May repeat in 2 hours if needed    Dispense:  9 tablet    Refill:  11  .  ondansetron (ZOFRAN-ODT) 4 MG disintegrating tablet    Sig: Take 1 tablet (4 mg total) by mouth every 8 (eight) hours as needed for nausea.    Dispense:  30 tablet    Refill:  3     Discussed: To prevent or relieve headaches, try the following: Cool Compress. Lie down and place a cool compress on your head.  Avoid headache triggers. If certain foods or odors seem to have triggered your migraines in the past, avoid them. A headache diary might help you identify triggers.  Include physical activity in your daily routine. Try a daily walk or other moderate aerobic exercise.  Manage stress. Find healthy ways to cope with the stressors, such as delegating tasks on your to-do list.  Practice relaxation techniques. Try deep breathing, yoga, massage and visualization.  Eat regularly. Eating regularly scheduled meals and maintaining a healthy diet might help prevent headaches. Also, drink plenty of fluids.  Follow a regular sleep schedule. Sleep deprivation might contribute to headaches Consider biofeedback. With this mind-body technique, you learn to control certain bodily functions - such as muscle tension, heart rate and blood pressure - to prevent headaches or reduce headache pain.    Proceed to emergency room if you experience new or worsening symptoms or symptoms do not resolve, if you have new neurologic symptoms or if headache is severe, or for any concerning symptom.   Provided education and documentation from American headache Society toolbox including articles on: chronic migraine medication overuse headache, chronic migraines, prevention of migraines, behavioral and other nonpharmacologic treatments for headache.    Sarina Ill, MD  [facility] [address] [place], [place] [address]  Phone [phone] Fax [phone]  A total of 25 minutes was spent face-to-face with this patient. Over half this time was spent on counseling patient on the  migraine diagnosis and different diagnostic and therapeutic options available.

## 2018-07-28 NOTE — Patient Instructions (Signed)
Maxalt (Rizatriptan): Please take one tablet at the onset of your headache. If it does not improve the symptoms please take one additional tablet. Do not take more then 2 tablets in 24hrs. Do not take use more then 2 to 3 times in a week.  Nausea or migraine: Ondansetron  Ondansetron tablets What is this medicine? ONDANSETRON (on DAN se tron) is used to treat nausea and vomiting caused by chemotherapy. It is also used to prevent or treat nausea and vomiting after surgery. This medicine may be used for other purposes; ask your health care provider or pharmacist if you have questions. COMMON BRAND NAME(S): Zofran What should I tell my health care provider before I take this medicine? They need to know if you have any of these conditions: -heart disease -history of irregular heartbeat -liver disease -low levels of magnesium or potassium in the blood -an unusual or allergic reaction to ondansetron, granisetron, other medicines, foods, dyes, or preservatives -pregnant or trying to get pregnant -breast-feeding How should I use this medicine? Take this medicine by mouth with a glass of water. Follow the directions on your prescription label. Take your doses at regular intervals. Do not take your medicine more often than directed. Talk to your pediatrician regarding the use of this medicine in children. Special care may be needed. Overdosage: If you think you have taken too much of this medicine contact a poison control center or emergency room at once. NOTE: This medicine is only for you. Do not share this medicine with others. What if I miss a dose? If you miss a dose, take it as soon as you can. If it is almost time for your next dose, take only that dose. Do not take double or extra doses. What may interact with this medicine? Do not take this medicine with any of the following medications: -apomorphine -certain medicines for fungal infections like fluconazole, itraconazole, ketoconazole,  posaconazole, voriconazole -cisapride -dofetilide -dronedarone -pimozide -thioridazine -ziprasidone This medicine may also interact with the following medications: -carbamazepine -certain medicines for depression, anxiety, or psychotic disturbances -fentanyl -linezolid -MAOIs like Carbex, Eldepryl, Marplan, Nardil, and Parnate -methylene blue (injected into a vein) -other medicines that prolong the QT interval (cause an abnormal heart rhythm) -phenytoin -rifampicin -tramadol This list may not describe all possible interactions. Give your health care provider a list of all the medicines, herbs, non-prescription drugs, or dietary supplements you use. Also tell them if you smoke, drink alcohol, or use illegal drugs. Some items may interact with your medicine. What should I watch for while using this medicine? Check with your doctor or health care professional right away if you have any sign of an allergic reaction. What side effects may I notice from receiving this medicine? Side effects that you should report to your doctor or health care professional as soon as possible: -allergic reactions like skin rash, itching or hives, swelling of the face, lips or tongue -breathing problems -confusion -dizziness -fast or irregular heartbeat -feeling faint or lightheaded, falls -fever and chills -loss of balance or coordination -seizures -sweating -swelling of the hands or feet -tightness in the chest -tremors -unusually weak or tired Side effects that usually do not require medical attention (report to your doctor or health care professional if they continue or are bothersome): -constipation or diarrhea -headache This list may not describe all possible side effects. Call your doctor for medical advice about side effects. You may report side effects to FDA at 1-800-FDA-1088. Where should I keep my medicine? Keep  out of the reach of children. Store between 2 and 30 degrees C (36 and 86  degrees F). Throw away any unused medicine after the expiration date. NOTE: This sheet is a summary. It may not cover all possible information. If you have questions about this medicine, talk to your doctor, pharmacist, or health care provider.  2018 Elsevier/Gold Standard (2013-06-23 16:27:45)   Rizatriptan disintegrating tablets What is this medicine? RIZATRIPTAN (rye za TRIP tan) is used to treat migraines with or without aura. An aura is a strange feeling or visual disturbance that warns you of an attack. It is not used to prevent migraines. This medicine may be used for other purposes; ask your health care provider or pharmacist if you have questions. COMMON BRAND NAME(S): Maxalt-MLT What should I tell my health care provider before I take this medicine? They need to know if you have any of these conditions: -bowel disease or colitis -diabetes -family history of heart disease -fast or irregular heart beat -heart or blood vessel disease, angina (chest pain), or previous heart attack -high blood pressure -high cholesterol -history of stroke, transient ischemic attacks (TIAs or mini-strokes), or intracranial bleeding -kidney or liver disease -overweight -poor circulation -postmenopausal or surgical removal of uterus and ovaries -an unusual or allergic reaction to rizatriptan, other medicines, foods, dyes, or preservatives -pregnant or trying to get pregnant -breast-feeding How should I use this medicine? Take this medicine by mouth. Follow the directions on the prescription label. This medicine is taken at the first symptoms of a migraine. It is not for everyday use. Leave the tablet in the foil package until you are ready to take it. Do not push the tablet through the blister pack. Peel open the blister pack with dry hands and place the tablet on your tongue. The tablet will dissolve rapidly and be swallowed in your saliva. It is not necessary to drink any water to take this medicine.  If your migraine headache returns after one dose, you can take another dose as directed. You must leave at least 2 hours between doses, and do not take more than 30 mg total in 24 hours. If there is no improvement at all after the first dose, do not take a second dose without talking to your doctor or health care professional. Do not take your medicine more often than directed. Talk to your pediatrician regarding the use of this medicine in children. While this drug may be prescribed for children as young as 6 years for selected conditions, precautions do apply. Overdosage: If you think you have taken too much of this medicine contact a poison control center or emergency room at once. NOTE: This medicine is only for you. Do not share this medicine with others. What if I miss a dose? This does not apply; this medicine is not for regular use. What may interact with this medicine? Do not take this medicine with any of the following medicines: -amphetamine, dextroamphetamine or cocaine -dihydroergotamine, ergotamine, ergoloid mesylates, methysergide, or ergot-type medication - do not take within 24 hours of taking rizatriptan -feverfew -MAOIs like Carbex, Eldepryl, Marplan, Nardil, and Parnate - do not take rizatriptan within 2 weeks of stopping MAOI therapy. -other migraine medicines like almotriptan, eletriptan, naratriptan, sumatriptan, zolmitriptan - do not take within 24 hours of taking rizatriptan -tryptophan This medicine may also interact with the following medications: -medicines for mental depression, anxiety or mood problems -propranolol This list may not describe all possible interactions. Give your health care provider a list of  all the medicines, herbs, non-prescription drugs, or dietary supplements you use. Also tell them if you smoke, drink alcohol, or use illegal drugs. Some items may interact with your medicine. What should I watch for while using this medicine? Only take this  medicine for a migraine headache. Take it if you get warning symptoms or at the start of a migraine attack. It is not for regular use to prevent migraine attacks. You may get drowsy or dizzy. Do not drive, use machinery, or do anything that needs mental alertness until you know how this medicine affects you. To reduce dizzy or fainting spells, do not sit or stand up quickly, especially if you are an older patient. Alcohol can increase drowsiness, dizziness and flushing. Avoid alcoholic drinks. Smoking cigarettes may increase the risk of heart-related side effects from using this medicine. If you take migraine medicines for 10 or more days a month, your migraines may get worse. Keep a diary of headache days and medicine use. Contact your healthcare professional if your migraine attacks occur more frequently. What side effects may I notice from receiving this medicine? Side effects that you should report to your doctor or health care professional as soon as possible: -allergic reactions like skin rash, itching or hives, swelling of the face, lips, or tongue -fast, slow, or irregular heart beat -increased or decreased blood pressure -seizures -severe stomach pain and cramping, bloody diarrhea -signs and symptoms of a blood clot such as breathing problems; changes in vision; chest pain; severe, sudden headache; pain, swelling, warmth in the leg; trouble speaking; sudden numbness or weakness of the face, arm or leg -tingling, pain, or numbness in the face, hands, or feet Side effects that usually do not require medical attention (report to your doctor or health care professional if they continue or are bothersome): -drowsiness -dry mouth -feeling warm, flushing, or redness of the face -headache -muscle cramps, pain -nausea, vomiting -unusually weak or tired This list may not describe all possible side effects. Call your doctor for medical advice about side effects. You may report side effects to FDA  at 1-800-FDA-1088. Where should I keep my medicine? Keep out of the reach of children. Store at room temperature between 15 and 30 degrees C (59 and 86 degrees F). Protect from light and moisture. Throw away any unused medicine after the expiration date. NOTE: This sheet is a summary. It may not cover all possible information. If you have questions about this medicine, talk to your doctor, pharmacist, or health care provider.  2018 Elsevier/Gold Standard (2013-05-18 10:17:42)

## 2018-08-07 ENCOUNTER — Telehealth: Payer: Self-pay | Admitting: Neurology

## 2018-08-07 NOTE — Telephone Encounter (Signed)
Spoke with patient. She said the zofran and maxalt help but her headaches will come back. She said she really doesn't like injections so she would rather try something oral. Discussed that RN will send message to Dr. Jaynee Eagles and she can take a look and then we will either call back or send her a message through her mychart. Encouraged pt to use mychart as well as this can be a great adjunct to communication with Dr. Jaynee Eagles. She verbalized understanding and appreciation.

## 2018-08-07 NOTE — Telephone Encounter (Signed)
Pt called stating her h/a are constant, not much relief with medication(unsure of name). Requesting a call to discuss further

## 2018-08-10 ENCOUNTER — Other Ambulatory Visit: Payer: Self-pay | Admitting: Neurology

## 2018-08-10 ENCOUNTER — Encounter: Payer: Self-pay | Admitting: *Deleted

## 2018-08-10 MED ORDER — TOPIRAMATE ER 50 MG PO CAP24: 50.0000 mg | ORAL_CAPSULE | Freq: Every day | ORAL | 11 refills | Status: DC

## 2018-08-10 NOTE — Telephone Encounter (Signed)
I prescribed trokendi, she may need a card though have been emailing with her

## 2018-08-10 NOTE — Telephone Encounter (Signed)
Noted, will complete PA on Cover My Meds.

## 2018-08-10 NOTE — Telephone Encounter (Addendum)
PA completed on Cover My Meds. KEY: [id number]. Anticipate determination within 72 hours.

## 2018-08-10 NOTE — Telephone Encounter (Signed)
CaseId: [id number]; Status: Approved; Review Type: Prior Auth; Coverage Start Date: 07/11/2018; Coverage End Date: 08/10/2019.   Messaged pt through [employer].

## 2018-08-26 ENCOUNTER — Ambulatory Visit: Payer: Self-pay | Admitting: Neurology

## 2018-10-05 ENCOUNTER — Emergency Department (HOSPITAL_BASED_OUTPATIENT_CLINIC_OR_DEPARTMENT_OTHER)
Admission: EM | Admit: 2018-10-05 | Discharge: 2018-10-05 | Disposition: A | Discharge status: Home / Self Care | Payer: BLUE CROSS/BLUE SHIELD | Attending: Emergency Medicine | Admitting: Emergency Medicine

## 2018-10-05 ENCOUNTER — Other Ambulatory Visit: Payer: Self-pay

## 2018-10-05 ENCOUNTER — Encounter (HOSPITAL_BASED_OUTPATIENT_CLINIC_OR_DEPARTMENT_OTHER): Payer: Self-pay

## 2018-10-05 DIAGNOSIS — M79604 Pain in right leg: Secondary | ICD-10-CM | POA: Insufficient documentation

## 2018-10-05 DIAGNOSIS — Z5321 Procedure and treatment not carried out due to patient leaving prior to being seen by health care provider: Secondary | ICD-10-CM | POA: Diagnosis not present

## 2018-10-05 DIAGNOSIS — M79605 Pain in left leg: Secondary | ICD-10-CM | POA: Diagnosis not present

## 2018-10-05 NOTE — ED Notes (Signed)
Did not answer for vitals reassess.

## 2018-10-05 NOTE — ED Triage Notes (Signed)
Pt states she has lupus with chronic joint pain-increase in joint pain to bilat LE and diarrhea x 2 days-NAD-steady gait

## 2018-11-21 ENCOUNTER — Emergency Department (HOSPITAL_BASED_OUTPATIENT_CLINIC_OR_DEPARTMENT_OTHER): Payer: BLUE CROSS/BLUE SHIELD

## 2018-11-21 ENCOUNTER — Other Ambulatory Visit: Payer: Self-pay

## 2018-11-21 ENCOUNTER — Emergency Department (HOSPITAL_BASED_OUTPATIENT_CLINIC_OR_DEPARTMENT_OTHER)
Admission: EM | Admit: 2018-11-21 | Discharge: 2018-11-21 | Disposition: A | Discharge status: Home / Self Care | Payer: BLUE CROSS/BLUE SHIELD | Attending: Emergency Medicine | Admitting: Emergency Medicine

## 2018-11-21 ENCOUNTER — Encounter (HOSPITAL_BASED_OUTPATIENT_CLINIC_OR_DEPARTMENT_OTHER): Payer: Self-pay | Admitting: Emergency Medicine

## 2018-11-21 DIAGNOSIS — R1111 Vomiting without nausea: Secondary | ICD-10-CM

## 2018-11-21 DIAGNOSIS — Z79899 Other long term (current) drug therapy: Secondary | ICD-10-CM | POA: Insufficient documentation

## 2018-11-21 DIAGNOSIS — J029 Acute pharyngitis, unspecified: Secondary | ICD-10-CM | POA: Diagnosis not present

## 2018-11-21 DIAGNOSIS — E039 Hypothyroidism, unspecified: Secondary | ICD-10-CM | POA: Diagnosis not present

## 2018-11-21 LAB — CBC WITH DIFFERENTIAL/PLATELET
Abs Immature Granulocytes: 0.01 10*3/uL (ref 0.00–0.07)
Basophils Absolute: 0 10*3/uL (ref 0.0–0.1)
Basophils Relative: 0 %
Eosinophils Absolute: 0.2 10*3/uL (ref 0.0–0.5)
Eosinophils Relative: 3 %
HCT: 36.1 % (ref 36.0–46.0)
Hemoglobin: 11.7 g/dL — ABNORMAL LOW (ref 12.0–15.0)
Immature Granulocytes: 0 %
LYMPHS ABS: 1.7 10*3/uL (ref 0.7–4.0)
LYMPHS PCT: 29 %
MCH: 22.5 pg — ABNORMAL LOW (ref 26.0–34.0)
MCHC: 32.4 g/dL (ref 30.0–36.0)
MCV: 69.6 fL — ABNORMAL LOW (ref 80.0–100.0)
Monocytes Absolute: 0.8 10*3/uL (ref 0.1–1.0)
Monocytes Relative: 14 %
Neutro Abs: 3 10*3/uL (ref 1.7–7.7)
Neutrophils Relative %: 54 %
Platelets: 293 10*3/uL (ref 150–400)
RBC: 5.19 MIL/uL — ABNORMAL HIGH (ref 3.87–5.11)
RDW: 17.7 % — AB (ref 11.5–15.5)
Smear Review: NORMAL
WBC: 5.7 10*3/uL (ref 4.0–10.5)
nRBC: 0 % (ref 0.0–0.2)

## 2018-11-21 LAB — BASIC METABOLIC PANEL
Anion gap: 2 — ABNORMAL LOW (ref 5–15)
BUN: 8 mg/dL (ref 6–20)
CO2: 23 mmol/L (ref 22–32)
CREATININE: 0.8 mg/dL (ref 0.44–1.00)
Calcium: 8.8 mg/dL — ABNORMAL LOW (ref 8.9–10.3)
Chloride: 113 mmol/L — ABNORMAL HIGH (ref 98–111)
GFR calc Af Amer: >60 mL/min (ref >60)
GFR calc non Af Amer: >60 mL/min (ref >60)
Glucose, Bld: 90 mg/dL (ref 70–99)
Potassium: 3.9 mmol/L (ref 3.5–5.1)
Sodium: 138 mmol/L (ref 135–145)

## 2018-11-21 LAB — GROUP A STREP BY PCR: Group A Strep by PCR: NOT DETECTED

## 2018-11-21 MED ORDER — DEXAMETHASONE SODIUM PHOSPHATE 10 MG/ML IJ SOLN
10.0000 mg | Freq: Four times a day (QID) | INTRAMUSCULAR | Status: DC
  Administered 2018-11-21: 10 mg via INTRAVENOUS
  Filled 2018-11-21: qty 1

## 2018-11-21 MED ORDER — ONDANSETRON HCL 4 MG PO TABS: 4.0000 mg | ORAL_TABLET | Freq: Four times a day (QID) | ORAL | 0 refills | Status: DC | PRN

## 2018-11-21 MED ORDER — SODIUM CHLORIDE 0.9 % IV BOLUS
1000.0000 mL | Freq: Once | INTRAVENOUS | Status: AC
  Administered 2018-11-21: 1000 mL via INTRAVENOUS

## 2018-11-21 MED ORDER — DIPHENHYDRAMINE HCL 50 MG/ML IJ SOLN
25.0000 mg | Freq: Once | INTRAMUSCULAR | Status: AC
  Administered 2018-11-21: 25 mg via INTRAVENOUS

## 2018-11-21 MED ORDER — DIPHENHYDRAMINE HCL 50 MG/ML IJ SOLN
INTRAMUSCULAR | Status: AC
  Administered 2018-11-21: 25 mg via INTRAVENOUS
  Filled 2018-11-21: qty 1

## 2018-11-21 NOTE — ED Notes (Signed)
Pt c/o burning sessation all over body after Decadron administered; EDP notified; orders received.

## 2018-11-21 NOTE — ED Triage Notes (Signed)
Patient states that she is having a sore throat  - the  Patient states that she has had pain to her throat since yesterday and a hx of a "lupus" flair with a sore throat in the past

## 2018-11-21 NOTE — ED Provider Notes (Signed)
[facility] EMERGENCY DEPARTMENT Provider Note   CSN: [id number] Arrival date & time: 11/21/18  1255    History   Chief Complaint Chief Complaint  Patient presents with  . Sore Throat    HPI Debbie [name] is a 38 y.o. female.     HPI Presents with sore throat that started 2 days ago.  She denies any fever or chills.  She does endorse some nasal congestion and states she has been gagging and vomiting.  Denies abdominal pain.  States she has had decreased oral intake of the last 24 hours.  Denies any difficulty breathing.  States she does have pain with swallowing.  Patient has a history of lupus and is on low-dose of prednisone daily. Past Medical History:  Diagnosis Date  . Anemia   . Chest pain   . Hypothyroidism   . Lupus ([place])   . Migraine   . Migraines   . Systemic lupus ([place])   . Thyroid disease   . UTI (urinary tract infection)   . Vaginal delivery 2002, 2009, 2015  . Varicose veins    Left > than right    Patient Active Problem List   Diagnosis Date Noted  . Active labor 03/22/2014  . Varicose veins of leg with pain 02/16/2013  . Laryngitis, acute 04/28/2012  . Vomiting 04/28/2012  . Fever 04/28/2012  . Subconjunctival hemorrhage sm prob from vomiting 04/28/2012  . Anemia 01/27/2012  . Migraine 02/18/2011  . Allergic rhinitis 02/18/2011  . Hypothyroid 02/18/2011    Past Surgical History:  Procedure Laterality Date  . APPENDECTOMY    . DILATION AND EVACUATION N/A 04/27/2013   Procedure: DILATATION AND EVACUATION;  Surgeon: Daria Pastures, MD;  Location: [facility];  Service: Gynecology;  Laterality: N/A;  . LAPAROSCOPIC TUBAL LIGATION Bilateral 06/08/2014   Procedure: LAPAROSCOPIC TUBAL LIGATION;  Surgeon: Daria Pastures, MD;  Location: [facility];  Service: Gynecology;  Laterality: Bilateral;  . TONSILLECTOMY  2001  . TONSILLECTOMY    . TONSILLECTOMY AND ADENOIDECTOMY    . UMBILICAL HERNIA REPAIR  03/2017   x2     OB History    Gravida    6   Para  3   Term  3   Preterm      AB  3   Living  3     SAB  2   TAB  1   Ectopic      Multiple      Live Births  3            Home Medications    Prior to Admission medications   Medication Sig Start Date End Date Taking? Authorizing Provider  Belimumab (BENLYSTA IV) Inject into the vein. Every 4 weeks.    [provider]  cephALEXin (KEFLEX) 500 MG capsule Take 500 mg by mouth 2 (two) times daily. Finish on 07/28/18    [provider]  Cholecalciferol (VITAMIN D PO) Take by mouth daily.    [provider]  levothyroxine (SYNTHROID, LEVOTHROID) 150 MCG tablet Take 150 mcg by mouth daily before breakfast.    [provider]  Multiple Vitamins-Minerals (MULTIVITAMIN PO) Take by mouth daily.    [provider]  ondansetron (ZOFRAN) 4 MG tablet Take 1 tablet (4 mg total) by mouth every 6 (six) hours as needed for nausea or vomiting. 11/21/18   Julianne Rice, MD  ondansetron (ZOFRAN-ODT) 4 MG disintegrating tablet Take 1 tablet (4 mg total) by mouth every 8 (  eight) hours as needed for nausea. 07/28/18   Melvenia Beam, MD  rizatriptan (MAXALT-MLT) 10 MG disintegrating tablet Take 1 tablet (10 mg total) by mouth as needed for migraine. May repeat in 2 hours if needed 07/28/18   Melvenia Beam, MD  Topiramate ER (TROKENDI XR) 50 MG CP24 Take 50 mg by mouth at bedtime. 08/10/18   Melvenia Beam, MD  traMADol (ULTRAM) 50 MG tablet Take 50 mg by mouth as needed.    [provider]    Family History Family History  Problem Relation Age of Onset  . Hypertension Mother   . Hyperlipidemia Father   . Diabetes Father   . Hypertension Father   . Arthritis Neg Hx        family  . Migraines Neg Hx     Social History Social History   Tobacco Use  . Smoking status: Never Smoker  . Smokeless tobacco: Never Used  Substance Use Topics  . Alcohol use: No  . Drug use: No     Allergies   Aspirin; Bactrim;  and Sulfa antibiotics   Review of Systems Review of Systems  Constitutional: Negative for chills and fever.  HENT: Positive for congestion, rhinorrhea, sore throat, trouble swallowing and voice change. Negative for sinus pressure and sinus pain.   Eyes: Negative for visual disturbance.  Respiratory: Positive for cough. Negative for shortness of breath, wheezing and stridor.   Cardiovascular: Negative for chest pain.  Gastrointestinal: Positive for vomiting. Negative for abdominal pain, diarrhea and nausea.  Musculoskeletal: Negative for back pain, myalgias, neck pain and neck stiffness.  Skin: Negative for rash and wound.  Neurological: Negative for dizziness, weakness, light-headedness, numbness and headaches.  All other systems reviewed and are negative.    Physical Exam Updated Vital Signs BP 121/71 (BP Location: Left Arm)   Pulse 62   Temp 98.2 F (36.8 C) (Oral)   Resp 16   Ht 5\' 2"  (1.575 m)   Wt 78 kg   LMP 11/13/2018   SpO2 100%   BMI 31.46 kg/m   Physical Exam Vitals signs and nursing note reviewed.  Constitutional:      General: She is not in acute distress.    Appearance: Normal appearance. She is well-developed. She is not ill-appearing.     Comments: Occasionally spitting into a bag  HENT:     Head: Normocephalic and atraumatic.     Nose: Congestion present.     Mouth/Throat:     Mouth: Mucous membranes are moist.     Pharynx: Posterior oropharyngeal erythema present. No oropharyngeal exudate.     Comments: Oropharynx is erythematous.  Uvula is midline. Eyes:     Extraocular Movements: Extraocular movements intact.     Conjunctiva/sclera: Conjunctivae normal.     Pupils: Pupils are equal, round, and reactive to light.  Neck:     Musculoskeletal: Normal range of motion and neck supple. No neck rigidity or muscular tenderness.     Vascular: No carotid bruit.     Comments: No stridor. Cardiovascular:     Rate and Rhythm: Normal rate and regular rhythm.      Heart sounds: No murmur. No friction rub. No gallop.   Pulmonary:     Effort: Pulmonary effort is normal. No respiratory distress.     Breath sounds: Normal breath sounds. No stridor. No wheezing or rhonchi.     Comments: Mild hoarseness to patient's voice.  No respiratory distress. Chest:     Chest wall:  No tenderness.  Abdominal:     General: Bowel sounds are normal.     Palpations: Abdomen is soft.     Tenderness: There is no abdominal tenderness. There is no guarding or rebound.  Musculoskeletal: Normal range of motion.        General: No swelling, tenderness, deformity or signs of injury.     Right lower leg: No edema.     Left lower leg: No edema.  Lymphadenopathy:     Cervical: No cervical adenopathy.  Skin:    General: Skin is warm and dry.     Capillary Refill: Capillary refill takes less than 2 seconds.     Findings: No erythema or rash.  Neurological:     General: No focal deficit present.     Mental Status: She is alert and oriented to person, place, and time.  Psychiatric:        Mood and Affect: Mood normal.        Behavior: Behavior normal.      ED Treatments / Results  Labs (all labs ordered are listed, but only abnormal results are displayed) Labs Reviewed  CBC WITH DIFFERENTIAL/PLATELET - Abnormal; Notable for the following components:      Result Value   RBC 5.19 (*)    Hemoglobin 11.7 (*)    MCV 69.6 (*)    MCH 22.5 (*)    RDW 17.7 (*)    All other components within normal limits  BASIC METABOLIC PANEL - Abnormal; Notable for the following components:   Chloride 113 (*)    Calcium 8.8 (*)    Anion gap 2 (*)    All other components within normal limits  GROUP A STREP BY PCR    EKG None  Radiology Dg Neck Soft Tissue  Result Date: 11/21/2018 CLINICAL DATA:  Sore throat and congestion. EXAM: NECK SOFT TISSUES - 1+ VIEW COMPARISON:  None. FINDINGS: There is no evidence of retropharyngeal soft tissue swelling or epiglottic enlargement. The  cervical airway is unremarkable and no radio-opaque foreign body identified. IMPRESSION: Negative. Electronically Signed   By: Markus Daft M.D.   On: 11/21/2018 15:26    Procedures Procedures (including critical care time)  Medications Ordered in ED Medications  dexamethasone (DECADRON) injection 10 mg (10 mg Intravenous Given 11/21/18 1423)  sodium chloride 0.9 % bolus 1,000 mL (0 mLs Intravenous Stopped 11/21/18 1529)  diphenhydrAMINE (BENADRYL) injection 25 mg (25 mg Intravenous Given 11/21/18 1431)     Initial Impression / Assessment and Plan / ED Course  I have reviewed the triage vital signs and the nursing notes.  Pertinent labs & imaging results that were available during my care of the patient were reviewed by me and considered in my medical decision making (see chart for details).        Likely pharyngitis.  Will test for strep.  No evidence of airway compromise.  Patient is on chronic low-dose steroids.  Will get imaging of the neck.  Also start IV and give IV fluids and Decadron.  Patient had a burning sensation after receiving IV Decadron.  This is since resolved.  She is resting comfortably.  She is in no distress.  Further vomiting in the emergency department.  X-ray without acute concerning findings.  Normal white blood cell count.  Strep negative.  Patient states she is feeling better after IV fluids.  Strict return precautions given. Final Clinical Impressions(s) / ED Diagnoses   Final diagnoses:  Pharyngitis, unspecified etiology  Non-intractable vomiting  without nausea, unspecified vomiting type    ED Discharge Orders         Ordered    ondansetron (ZOFRAN) 4 MG tablet  Every 6 hours PRN     11/21/18 1533           Julianne Rice, MD 11/21/18 1533

## 2018-11-21 NOTE — ED Notes (Signed)
Patient transported to CT 

## 2018-11-21 NOTE — ED Notes (Signed)
Pt enrolled in aromatherapy pain trial 

## 2018-12-14 ENCOUNTER — Telehealth: Payer: BLUE CROSS/BLUE SHIELD | Admitting: Family

## 2018-12-14 ENCOUNTER — Telehealth: Payer: Self-pay | Admitting: Neurology

## 2018-12-14 DIAGNOSIS — R112 Nausea with vomiting, unspecified: Secondary | ICD-10-CM

## 2018-12-14 MED ORDER — ONDANSETRON HCL 4 MG PO TABS: 4.0000 mg | ORAL_TABLET | Freq: Three times a day (TID) | ORAL | 0 refills | Status: DC | PRN

## 2018-12-14 NOTE — Telephone Encounter (Addendum)
Spoke with infusion & Dr. Krista Blue. We can do an infusion for migraine this afternoon. Per Dr. Krista Blue, received order for:  Depacon 500 mg IV, repeat in 15 minutes Compazine 10 mg IV x 1 (if pt has a driver) Toradol 30 mg IV x 1   Spoke with patient. She stated this migraine is resistant to her meds. She has neck pain going down her spine & has had some dizziness. Per Dr. Krista Blue, ok to try infusion. Patient will come within the hour. She will have a driver. She denied any fever or illness or symptoms of cough, sore throat, etc. Pt encouraged to please hydrate herself before coming. She verbalized appreciation.   Orders written & copy of insurance printed. Orders signed & given to infusion RN.

## 2018-12-14 NOTE — Progress Notes (Signed)
Greater than 5 minutes, yet less than 10 minutes of time have been spent researching, coordinating, and implementing care for this patient today.  Thank you for the details you included in the comment boxes. Those details are very helpful in determining the best course of treatment for you and help [place] to provide the best care.  Regarding the headache, hopefully this will improve as you begin to feel better. Do your best to stay hydrated as below. Also, you may use Tylenol/ibuprofen for the headache in the meantime.   We are sorry that you are not feeling well. Here is how we plan to help!  Based on what you have shared with me it looks like you have a Virus that is irritating your GI tract.  Vomiting is the forceful emptying of a portion of the stomach's content through the mouth.  Although nausea and vomiting can make you feel miserable, it's important to remember that these are not diseases, but rather symptoms of an underlying illness.  When we treat short term symptoms, we always caution that any symptoms that persist should be fully evaluated in a medical office.  I have prescribed a medication that will help alleviate your symptoms and allow you to stay hydrated:  Zofran 4 mg 1 tablet every 8 hours as needed for nausea and vomiting  HOME CARE:  Drink clear liquids.  This is very important! Dehydration (the lack of fluid) can lead to a serious complication.  Start off with 1 tablespoon every 5 minutes for 8 hours.  You may begin eating bland foods after 8 hours without vomiting.  Start with saltine crackers, white bread, rice, mashed potatoes, applesauce.  After 48 hours on a bland diet, you may resume a normal diet.  Try to go to sleep.  Sleep often empties the stomach and relieves the need to vomit.  GET HELP RIGHT AWAY IF:   Your symptoms do not improve or worsen within 2 days after treatment.  You have a fever for over 3 days.  You cannot keep down fluids after trying the  medication.  MAKE SURE YOU:   Understand these instructions.  Will watch your condition.  Will get help right away if you are not doing well or get worse.   Thank you for choosing an e-visit. Your e-visit answers were reviewed by a board certified advanced clinical practitioner to complete your personal care plan. Depending upon the condition, your plan could have included both over the counter or prescription medications. Please review your pharmacy choice. Be sure that the pharmacy you have chosen is open so that you can pick up your prescription now.  If there is a problem you may message your provider in [place] to have the prescription routed to another pharmacy. Your safety is important to [place]. If you have drug allergies check your prescription carefully.  For the next 24 hours, you can use MyChart to ask questions about today's visit, request a non-urgent call back, or ask for a work or school excuse from your e-visit provider. You will get an e-mail in the next two days asking about your experience. I hope that your e-visit has been valuable and will speed your recovery.

## 2018-12-14 NOTE — Telephone Encounter (Signed)
Pt states she is having a really bad head and is wanting an Infusion. Please advise.

## 2019-02-18 DIAGNOSIS — Z0271 Encounter for disability determination: Secondary | ICD-10-CM

## 2019-03-02 ENCOUNTER — Encounter: Payer: BLUE CROSS/BLUE SHIELD | Admitting: Family Medicine

## 2019-03-03 ENCOUNTER — Ambulatory Visit: Payer: BLUE CROSS/BLUE SHIELD | Admitting: Family Medicine

## 2019-03-15 ENCOUNTER — Encounter: Payer: Self-pay | Admitting: Family Medicine

## 2019-03-30 ENCOUNTER — Telehealth: Payer: Self-pay | Admitting: *Deleted

## 2019-03-30 NOTE — Telephone Encounter (Signed)
Received a PA request for pt's Trokendi via [employer]. Completed this on CMM at pre-provided key [id number] - Rx #: [id number]. Awaiting BCBS determination.   Meds tried: Nortriptyline, Topamax and Trokendi, flexeril, zonisamide,  "If [employer] [facility] has not responded in 3 business days or if you have any questions about your submission, contact [facility] at [phone]."

## 2019-04-11 ENCOUNTER — Emergency Department (HOSPITAL_BASED_OUTPATIENT_CLINIC_OR_DEPARTMENT_OTHER)
Admission: EM | Admit: 2019-04-11 | Discharge: 2019-04-11 | Disposition: A | Discharge status: Home / Self Care | Payer: BC Managed Care – PPO | Attending: Emergency Medicine | Admitting: Emergency Medicine

## 2019-04-11 ENCOUNTER — Emergency Department (HOSPITAL_BASED_OUTPATIENT_CLINIC_OR_DEPARTMENT_OTHER): Payer: BC Managed Care – PPO

## 2019-04-11 ENCOUNTER — Other Ambulatory Visit: Payer: Self-pay

## 2019-04-11 DIAGNOSIS — E039 Hypothyroidism, unspecified: Secondary | ICD-10-CM | POA: Diagnosis not present

## 2019-04-11 DIAGNOSIS — R1031 Right lower quadrant pain: Secondary | ICD-10-CM

## 2019-04-11 LAB — COMPREHENSIVE METABOLIC PANEL
ALT: 11 U/L (ref 0–44)
AST: 15 U/L (ref 15–41)
Albumin: 4.3 g/dL (ref 3.5–5.0)
Alkaline Phosphatase: 32 U/L — ABNORMAL LOW (ref 38–126)
Anion gap: 7 (ref 5–15)
BUN: 17 mg/dL (ref 6–20)
CO2: 21 mmol/L — ABNORMAL LOW (ref 22–32)
Calcium: 8.7 mg/dL — ABNORMAL LOW (ref 8.9–10.3)
Chloride: 110 mmol/L (ref 98–111)
Creatinine, Ser: 0.83 mg/dL (ref 0.44–1.00)
GFR calc Af Amer: >60 mL/min (ref >60)
GFR calc non Af Amer: >60 mL/min (ref >60)
Glucose, Bld: 88 mg/dL (ref 70–99)
Potassium: 3.9 mmol/L (ref 3.5–5.1)
Sodium: 138 mmol/L (ref 135–145)
Total Bilirubin: 0.9 mg/dL (ref 0.3–1.2)
Total Protein: 6.6 g/dL (ref 6.5–8.1)

## 2019-04-11 LAB — CBC WITH DIFFERENTIAL/PLATELET
Abs Immature Granulocytes: 0.01 10*3/uL (ref 0.00–0.07)
Basophils Absolute: 0 10*3/uL (ref 0.0–0.1)
Basophils Relative: 1 %
Eosinophils Absolute: 0.1 10*3/uL (ref 0.0–0.5)
Eosinophils Relative: 1 %
HCT: 36.4 % (ref 36.0–46.0)
Hemoglobin: 11.7 g/dL — ABNORMAL LOW (ref 12.0–15.0)
Immature Granulocytes: 0 %
Lymphocytes Relative: 31 %
Lymphs Abs: 1.6 10*3/uL (ref 0.7–4.0)
MCH: 22.8 pg — ABNORMAL LOW (ref 26.0–34.0)
MCHC: 32.1 g/dL (ref 30.0–36.0)
MCV: 70.8 fL — ABNORMAL LOW (ref 80.0–100.0)
Monocytes Absolute: 0.7 10*3/uL (ref 0.1–1.0)
Monocytes Relative: 13 %
Neutro Abs: 2.9 10*3/uL (ref 1.7–7.7)
Neutrophils Relative %: 54 %
Platelets: 255 10*3/uL (ref 150–400)
RBC: 5.14 MIL/uL — ABNORMAL HIGH (ref 3.87–5.11)
RDW: 16.9 % — ABNORMAL HIGH (ref 11.5–15.5)
WBC Morphology: ABNORMAL
WBC: 5.3 10*3/uL (ref 4.0–10.5)
nRBC: 0 % (ref 0.0–0.2)

## 2019-04-11 LAB — PREGNANCY, URINE: Preg Test, Ur: NEGATIVE

## 2019-04-11 LAB — URINALYSIS, ROUTINE W REFLEX MICROSCOPIC
Bilirubin Urine: NEGATIVE
Glucose, UA: NEGATIVE mg/dL
Hgb urine dipstick: NEGATIVE
Ketones, ur: NEGATIVE mg/dL
Leukocytes,Ua: NEGATIVE
Nitrite: NEGATIVE
Protein, ur: NEGATIVE mg/dL
Specific Gravity, Urine: >1.03 — ABNORMAL HIGH (ref 1.005–1.030)
pH: 5.5 (ref 5.0–8.0)

## 2019-04-11 LAB — LIPASE, BLOOD: Lipase: 40 U/L (ref 11–51)

## 2019-04-11 MED ORDER — IOPAMIDOL (ISOVUE-300) INJECTION 61%
100.0000 mL | Freq: Once | INTRAVENOUS | Status: AC | PRN
  Administered 2019-04-11: 100 mL via INTRAVENOUS

## 2019-04-11 MED ORDER — ONDANSETRON HCL 4 MG/2ML IJ SOLN
4.0000 mg | Freq: Once | INTRAMUSCULAR | Status: AC
  Administered 2019-04-11: 4 mg via INTRAVENOUS
  Filled 2019-04-11: qty 2

## 2019-04-11 MED ORDER — TRAMADOL HCL 50 MG PO TABS
50.0000 mg | ORAL_TABLET | Freq: Once | ORAL | Status: AC
  Administered 2019-04-11: 50 mg via ORAL
  Filled 2019-04-11: qty 1

## 2019-04-11 MED ORDER — MORPHINE SULFATE (PF) 4 MG/ML IV SOLN
4.0000 mg | Freq: Once | INTRAVENOUS | Status: AC
  Administered 2019-04-11: 4 mg via INTRAVENOUS
  Filled 2019-04-11: qty 1

## 2019-04-11 MED ORDER — OXYCODONE HCL 5 MG PO TABS: 5.0000 mg | ORAL_TABLET | Freq: Four times a day (QID) | ORAL | 0 refills | Status: AC | PRN

## 2019-04-11 MED ORDER — SODIUM CHLORIDE 0.9 % IV BOLUS
1000.0000 mL | Freq: Once | INTRAVENOUS | Status: AC
  Administered 2019-04-11: 1000 mL via INTRAVENOUS

## 2019-04-11 MED ORDER — KETOROLAC TROMETHAMINE 30 MG/ML IJ SOLN
30.0000 mg | Freq: Once | INTRAMUSCULAR | Status: AC
  Administered 2019-04-11: 30 mg via INTRAVENOUS
  Filled 2019-04-11: qty 1

## 2019-04-11 NOTE — ED Notes (Signed)
ED Provider at bedside. 

## 2019-04-11 NOTE — ED Notes (Signed)
Patient transported to CT 

## 2019-04-11 NOTE — ED Notes (Signed)
Patient transported to [facility] 

## 2019-04-11 NOTE — Discharge Instructions (Addendum)
Continue Motrin, Tylenol for pain.  Use narcotic as needed.  Follow-up with your OB/GYN.  Return to ED if symptoms worsen.

## 2019-04-11 NOTE — ED Triage Notes (Signed)
Pt c/o RLQ pain x 5 days. Pt denies vomiting, last emesis yesterday afternoon. Pt states bearing weight on right leg increases pain. Pt verbalizes 800 mg otc motrin at 0700 today with mild relief. Denies fever, denies chills. Pt denies constipation. Pt states hx of lupus

## 2019-04-11 NOTE — ED Notes (Signed)
Patient states pain is starting to get worse. Notified [facility] RN of patients pain.

## 2019-04-11 NOTE — ED Notes (Signed)
Awaiting dispo

## 2019-04-11 NOTE — ED Provider Notes (Signed)
[facility] Provider Note   CSN: [id number] Arrival date & time: 04/11/19  1204    History   Chief Complaint Chief Complaint  Patient presents with   Abdominal Pain    HPI Debbie [name] is a 38 y.o. female.     HPI   5 days of RLQ abdominal pain Sharp, has been constant Ibuprofen helps some but it it is still there Feels hot at night like maybe has fever Had urinary symptoms at first but then they improved Nausea, no vomiting Diarrhea 3 days ago then improved No vaginal bleeding or discharge, no suspicion for STI   LMP 4-5 weeks ago  Hx of appendectomy at age 32  Past Medical History:  Diagnosis Date   Anemia    Chest pain    Hypothyroidism    Lupus ([place])    Migraine    Migraines    Systemic lupus ([place])    Thyroid disease    UTI (urinary tract infection)    Vaginal delivery 2002, 2009, 2015   Varicose veins    Left > than right    Patient Active Problem List   Diagnosis Date Noted   Active labor 03/22/2014   Varicose veins of leg with pain 02/16/2013   Laryngitis, acute 04/28/2012   Vomiting 04/28/2012   Fever 04/28/2012   Subconjunctival hemorrhage sm prob from vomiting 04/28/2012   Anemia 01/27/2012   Migraine 02/18/2011   Allergic rhinitis 02/18/2011   Hypothyroid 02/18/2011    Past Surgical History:  Procedure Laterality Date   APPENDECTOMY     DILATION AND EVACUATION N/A 04/27/2013   Procedure: DILATATION AND EVACUATION;  Surgeon: Daria Pastures, MD;  Location: [facility];  Service: Gynecology;  Laterality: N/A;   LAPAROSCOPIC TUBAL LIGATION Bilateral 06/08/2014   Procedure: LAPAROSCOPIC TUBAL LIGATION;  Surgeon: Daria Pastures, MD;  Location: [facility];  Service: Gynecology;  Laterality: Bilateral;   TONSILLECTOMY  2001   TONSILLECTOMY     TONSILLECTOMY AND ADENOIDECTOMY     UMBILICAL HERNIA REPAIR  03/2017   x2     OB History    Gravida  6   Para  3   Term  3   Preterm       AB  3   Living  3     SAB  2   TAB  1   Ectopic      Multiple      Live Births  3            Home Medications    Prior to Admission medications   Medication Sig Start Date End Date Taking? Authorizing Provider  Belimumab (BENLYSTA IV) Inject into the vein. Every 4 weeks.    [provider]  cephALEXin (KEFLEX) 500 MG capsule Take 500 mg by mouth 2 (two) times daily. Finish on 07/28/18    [provider]  Cholecalciferol (VITAMIN D PO) Take by mouth daily.    [provider]  DULoxetine (CYMBALTA) 20 MG capsule Take by mouth.    [provider]  levothyroxine (SYNTHROID, LEVOTHROID) 150 MCG tablet Take 150 mcg by mouth daily before breakfast.    [provider]  Multiple Vitamins-Minerals (MULTIVITAMIN PO) Take by mouth daily.    [provider]  ondansetron (ZOFRAN) 4 MG tablet Take 1 tablet (4 mg total) by mouth every 8 (eight) hours as needed for nausea or vomiting. 12/14/18   Withrow, Elyse Jarvis, FNP  ondansetron (ZOFRAN-ODT) 4 MG disintegrating tablet  Take 1 tablet (4 mg total) by mouth every 8 (eight) hours as needed for nausea. 07/28/18   Melvenia Beam, MD  oxyCODONE (ROXICODONE) 5 MG immediate release tablet Take 1 tablet (5 mg total) by mouth every 6 (six) hours as needed for up to 12 days for severe pain. 04/11/19 04/23/19  Curatolo, Adam, DO  rizatriptan (MAXALT-MLT) 10 MG disintegrating tablet Take 1 tablet (10 mg total) by mouth as needed for migraine. May repeat in 2 hours if needed 07/28/18   Melvenia Beam, MD  Topiramate ER (TROKENDI XR) 50 MG CP24 Take 50 mg by mouth at bedtime. 08/10/18   Melvenia Beam, MD  traMADol (ULTRAM) 50 MG tablet Take 50 mg by mouth as needed.    [provider]    Family History Family History  Problem Relation Age of Onset   Hypertension Mother    Hyperlipidemia Father    Diabetes Father    Hypertension Father    Arthritis Neg Hx        family    Migraines Neg Hx     Social History Social History   Tobacco Use   Smoking status: Never Smoker   Smokeless tobacco: Never Used  Substance Use Topics   Alcohol use: No   Drug use: No     Allergies   Aspirin, Bactrim, and Sulfa antibiotics   Review of Systems Review of Systems  Constitutional: Negative for fever.  HENT: Negative for sore throat.   Eyes: Negative for visual disturbance.  Respiratory: Negative for cough and shortness of breath.   Cardiovascular: Negative for chest pain.  Gastrointestinal: Positive for abdominal pain and nausea. Negative for diarrhea and vomiting.  Genitourinary: Positive for dysuria (improved now). Negative for difficulty urinating, vaginal bleeding and vaginal discharge.  Musculoskeletal: Negative for back pain and neck pain.  Skin: Negative for rash.  Neurological: Negative for syncope and headaches.     Physical Exam Updated Vital Signs BP 106/66 (BP Location: Right Arm)    Pulse (!) 58    Temp 98.4 F (36.9 C) (Oral)    Resp 18    Ht 5\' 2"  (1.575 m)    Wt 73.9 kg    LMP 03/20/2019 (Approximate)    SpO2 100%    BMI 29.81 kg/m   Physical Exam Vitals signs and nursing note reviewed.  Constitutional:      General: She is not in acute distress.    Appearance: She is well-developed. She is not diaphoretic.  HENT:     Head: Normocephalic and atraumatic.  Eyes:     Conjunctiva/sclera: Conjunctivae normal.  Neck:     Musculoskeletal: Normal range of motion.  Cardiovascular:     Rate and Rhythm: Normal rate and regular rhythm.  Pulmonary:     Effort: Pulmonary effort is normal. No respiratory distress.  Abdominal:     General: There is no distension.     Palpations: Abdomen is soft.     Tenderness: There is abdominal tenderness in the right lower quadrant. There is no guarding. Negative signs include Murphy's sign.  Musculoskeletal:        General: No tenderness.  Skin:    General: Skin is warm and dry.     Findings: No  erythema or rash.  Neurological:     Mental Status: She is alert and oriented to person, place, and time.      ED Treatments / Results  Labs (all labs ordered are listed, but only abnormal results are  displayed) Labs Reviewed  CBC WITH DIFFERENTIAL/PLATELET - Abnormal; Notable for the following components:      Result Value   RBC 5.14 (*)    Hemoglobin 11.7 (*)    MCV 70.8 (*)    MCH 22.8 (*)    RDW 16.9 (*)    All other components within normal limits  COMPREHENSIVE METABOLIC PANEL - Abnormal; Notable for the following components:   CO2 21 (*)    Calcium 8.7 (*)    Alkaline Phosphatase 32 (*)    All other components within normal limits  URINALYSIS, ROUTINE W REFLEX MICROSCOPIC - Abnormal; Notable for the following components:   Specific Gravity, Urine >1.030 (*)    All other components within normal limits  PREGNANCY, URINE  LIPASE, BLOOD    EKG None  Radiology Ct Abdomen Pelvis W Contrast  Result Date: 04/11/2019 CLINICAL DATA:  Abdominal pain, primarily right lower quadrant. EXAM: CT ABDOMEN AND PELVIS WITH CONTRAST TECHNIQUE: Multidetector CT imaging of the abdomen and pelvis was performed using the standard protocol following bolus administration of intravenous contrast. Oral contrast was also administered. CONTRAST:  161mL ISOVUE-300 IOPAMIDOL (ISOVUE-300) INJECTION 61% COMPARISON:  October 03, 2016 FINDINGS: Lower chest: Lung bases are clear. Hepatobiliary: No focal liver lesions are evident. The gallbladder wall is not appreciably thickened. There is no biliary duct dilatation. Pancreas: No pancreatic mass or inflammatory focus. Spleen: Spleen is again noted to be atrophic. No focal splenic lesions are evident. Adrenals/Urinary Tract: Adrenals appear unremarkable. There are two 8 mm cysts arising from the lateral mid to lower left kidney. There is no appreciable hydronephrosis on either side. There is a junctional parenchymal defect in each kidney, an anatomic variant.  There is no evident renal or ureteral calculus on either side. Urinary bladder is midline with wall thickness within normal limits. Stomach/Bowel: There is no appreciable bowel wall or mesenteric thickening. There is no evident bowel obstruction. Terminal ileum appears unremarkable. There is no evident free air or portal venous air. Vascular/Lymphatic: No abdominal aortic aneurysm. No vascular lesions are evident. There is no appreciable adenopathy in the abdomen or pelvis. Reproductive: The uterus is anteverted. Endometrium appears thickened at 19 mm. There is an apparent collapsed right ovarian cyst measuring 1.6 x 1.6 cm. Tubal ligation clips are present. Other: The appendix is absent. There is no periappendiceal region inflammation. There is no abscess in the abdomen pelvis. Mesh is seen along the anterior abdominal wall with apparent ventral hernia repair. No hernia evident currently in this area. Musculoskeletal: There are no blastic or lytic bone lesions. No intramuscular lesions evident. IMPRESSION: 1. Endometrium appears rather prominent. This finding may warrant pelvic ultrasound for further assessment. 2.  Apparent collapsed right ovarian cyst. 3. No evident bowel obstruction. No abscess in the abdomen or pelvis. Appendix absent. No periappendiceal region inflammatory change. 4. No evident renal or ureteral calculus. No hydronephrosis. Urinary bladder wall thickness is within normal limits. 5.  Postoperative change along the anterior abdominal wall. Electronically Signed   By: Lowella Grip III M.D.   On: 04/11/2019 15:07   US Pelvic Complete W Transvaginal And Torsion R/o  Result Date: 04/11/2019 CLINICAL DATA:  Right lower quadrant pain for 5 days. Abnormal CT scan today. EXAM: TRANSABDOMINAL AND TRANSVAGINAL ULTRASOUND OF PELVIS DOPPLER ULTRASOUND OF OVARIES TECHNIQUE: Both transabdominal and transvaginal ultrasound examinations of the pelvis were performed. Transabdominal technique was  performed for global imaging of the pelvis including uterus, ovaries, adnexal regions, and pelvic cul-de-sac. It was necessary to  proceed with endovaginal exam following the transabdominal exam to visualize the endometrium and ovaries. Color and duplex Doppler ultrasound was utilized to evaluate blood flow to the ovaries. COMPARISON:  CT scan April 11, 2019 FINDINGS: Uterus Measurements: 9.1 x 5.1 x 6 cm = volume: 145 mL. Multiple nabothian cysts in the cervix. No other abnormalities. Endometrium Thickness: 10 mm.  No focal abnormality visualized. Right ovary Measurements: 3 x 1.6 x 1.8 cm = volume: 4.6 mL. Contains a hypoechoic mass measuring 12 x 10 x 10 mm, likely a collapsing corpus luteum cyst seen on the CT from earlier today. No follow-up necessary. Left ovary Not visualized. Pulsed Doppler evaluation of the right ovary demonstrates normal low-resistance arterial and venous waveforms. Other findings Trace fluid in the cul-de-sac is likely physiologic. IMPRESSION: 1. A hypoechoic rounded region in the right ovary is consistent with the collapsing follicle/corpus luteum cyst seen on today's CT scan. No evidence of torsion or suspicious mass. 2. The left ovary is not visualized. 3. Trace physiologic fluid in the pelvis. 4. No other significant abnormalities. The endometrium is within normal limits for age. Electronically Signed   By: Dorise Bullion III M.D   On: 04/11/2019 16:41    Procedures Procedures (including critical care time)  Medications Ordered in ED Medications  morphine 4 MG/ML injection 4 mg (4 mg Intravenous Given 04/11/19 1313)  ondansetron (ZOFRAN) injection 4 mg (4 mg Intravenous Given 04/11/19 1313)  sodium chloride 0.9 % bolus 1,000 mL (0 mLs Intravenous Stopped 04/11/19 1415)  ketorolac (TORADOL) 30 MG/ML injection 30 mg (30 mg Intravenous Given 04/11/19 1345)  iopamidol (ISOVUE-300) 61 % injection 100 mL (100 mLs Intravenous Contrast Given 04/11/19 1425)  traMADol (ULTRAM) tablet 50  mg (50 mg Oral Given 04/11/19 1552)     Initial Impression / Assessment and Plan / ED Course  I have reviewed the triage vital signs and the nursing notes.  Pertinent labs & imaging results that were available during my care of the patient were reviewed by me and considered in my medical decision making (see chart for details).        38yo female with history of lupus, appendectomy, presents with concern for RLQ abdominal pain.  Given significant tenderness on exam, ordered CT to evaluate for abscess or other abnormality.  UA withou tinfection.  CT with collapsed right ovarian cyst, thickened endometrium. Will order pelvic US for further eval. Signed out to Dr. Ronnald Nian with imaging and reeval pending.   Final Clinical Impressions(s) / ED Diagnoses   Final diagnoses:  Right lower quadrant abdominal pain    ED Discharge Orders         Ordered    oxyCODONE (ROXICODONE) 5 MG immediate release tablet  Every 6 hours PRN     04/11/19 1723           Gareth Morgan, MD 04/13/19 1045

## 2019-04-11 NOTE — ED Notes (Signed)
ED MD informed of requesting more pain medicine, awaiting CT results

## 2019-04-11 NOTE — ED Provider Notes (Signed)
Patient awaiting ultrasound of the pelvis to rule out intrapelvic source for pain.  CT scan showed possible cyst of the ovary, thickened endometrium.  Patient with otherwise unremarkable work-up thus far.  Right lower quadrant pain.  Pelvic ultrasound showed collapsing follicle/cyst.  No torsion.  Pain is improved upon my reevaluation.  Endometrium appears well.  No evidence of abscess or infectious source.  Could also be pain from scar tissue as patient has had multiple abdominal surgeries including hernia repair with mesh.  Will prescribe narcotic pain medication and have her follow-up with her OB/GYN.  Low concern for torsion at this time.  Discharged from ED in good condition.  Given return precautions.  This chart was dictated using voice recognition software.  Despite best efforts to proofread,  errors can occur which can change the documentation meaning.     Lennice Sites, DO 04/11/19 1722

## 2019-04-30 ENCOUNTER — Ambulatory Visit: Payer: Medicaid Other | Admitting: Family Medicine

## 2019-05-05 DIAGNOSIS — Z0271 Encounter for disability determination: Secondary | ICD-10-CM

## 2019-05-07 ENCOUNTER — Telehealth: Payer: Self-pay

## 2019-05-07 NOTE — Telephone Encounter (Signed)
Megan - I have it blocked in the background so scheduling with [facility] is not allowed. I have also now added a note to her appt desk stating she is not allowed to r/s with [facility].   Arbie Cookey - Please call pt and offer her to r/s with another provider as she is not able to r/s with [facility]. If she makes another appt with a different provider she MUST keep it or risk not being allowed to schedule at this office again. Thanks!

## 2019-05-07 NOTE — Telephone Encounter (Signed)
This is the patient that Dr. Elease Hashimoto said to not allow to schedule with him since she was a no show for the last appointment and canceled the appointment before this one.  I know you were going to make a note of this but I so not see in the system??  Can you take care of this one?  Thank you!  Copied from [place] [phone]. Topic: General - Other >> May 05, 2019  9:20 AM [name] wrote: Reason for CRM: Patient would like a call back to reschedule her appointment that she missed on 04/30/2019 states she tried calling but did not get an answer. Ph# [phone] >> May 07, 2019 10:25 AM [name] wrote: Patient was a no show on 07/31 for new patient appointment. Can this be rescheduled?

## 2019-05-17 NOTE — Telephone Encounter (Signed)
I have the patient scheduled with Dr. Jerilee Hoh for 05/19/2019

## 2019-05-19 ENCOUNTER — Other Ambulatory Visit: Payer: Self-pay

## 2019-05-19 ENCOUNTER — Encounter: Payer: Self-pay | Admitting: Internal Medicine

## 2019-05-19 ENCOUNTER — Ambulatory Visit (INDEPENDENT_AMBULATORY_CARE_PROVIDER_SITE_OTHER): Payer: BC Managed Care – PPO | Admitting: Internal Medicine

## 2019-05-19 VITALS — BP 102/78 | HR 75 | Temp 97.6°F | Ht 62.0 in | Wt 171.6 lb

## 2019-05-19 DIAGNOSIS — G43011 Migraine without aura, intractable, with status migrainosus: Secondary | ICD-10-CM

## 2019-05-19 DIAGNOSIS — E669 Obesity, unspecified: Secondary | ICD-10-CM

## 2019-05-19 DIAGNOSIS — F339 Major depressive disorder, recurrent, unspecified: Secondary | ICD-10-CM | POA: Diagnosis not present

## 2019-05-19 DIAGNOSIS — M329 Systemic lupus erythematosus, unspecified: Secondary | ICD-10-CM | POA: Diagnosis not present

## 2019-05-19 DIAGNOSIS — E039 Hypothyroidism, unspecified: Secondary | ICD-10-CM

## 2019-05-19 DIAGNOSIS — Z23 Encounter for immunization: Secondary | ICD-10-CM

## 2019-05-19 DIAGNOSIS — K219 Gastro-esophageal reflux disease without esophagitis: Secondary | ICD-10-CM

## 2019-05-19 MED ORDER — LEVOTHYROXINE SODIUM 150 MCG PO TABS: 150.0000 ug | ORAL_TABLET | Freq: Every day | ORAL | 1 refills | Status: DC

## 2019-05-19 MED ORDER — PANTOPRAZOLE SODIUM 40 MG PO TBEC: 40.0000 mg | DELAYED_RELEASE_TABLET | Freq: Every day | ORAL | 0 refills | Status: DC

## 2019-05-19 NOTE — Patient Instructions (Addendum)
-  Nice meeting you today!!  -Start Protonix 40 mg daily.  -Referral to [facility].  -Schedule follow up visit at your convenience for your annual physical. Please come in fasting that day.  -Flu shot today.

## 2019-05-19 NOTE — Progress Notes (Signed)
New Patient Office Visit     CC/Reason for Visit: Establish care, discuss chronic conditions Previous PCP: Cloward with [facility] Last Visit: Unknown  HPI: Debbie [name] is a 38 y.o. female who is coming in today for the above mentioned reasons. Past Medical History is significant for: Systemic lupus erythematosus diagnosed in 2016, migraine headaches not currently on any treatment, hypothyroidism on Synthroid and depression on Cymbalta.  She states she has gained some weight recently while being on prednisone and is interested in weight management referral.  She is scheduled for hysterectomy in October with her GYN due to significant menometrorrhagia and no desire to preserve fertility.  Her rheumatologist is with [facility] rheumatology, she is currently on prednisone 5 mg daily and Benlysta injections every 4 weeks.  She has been complaining of some significant reflux and nausea especially after meals.   Past Medical/Surgical History: Past Medical History:  Diagnosis Date  . Anemia   . Chest pain   . Hypothyroidism   . Lupus ([place])   . Migraine   . Migraines   . Systemic lupus ([place])   . Thyroid disease   . UTI (urinary tract infection)   . Vaginal delivery 2002, 2009, 2015  . Varicose veins    Left > than right    Past Surgical History:  Procedure Laterality Date  . APPENDECTOMY    . DILATION AND EVACUATION N/A 04/27/2013   Procedure: DILATATION AND EVACUATION;  Surgeon: Daria Pastures, MD;  Location: [facility];  Service: Gynecology;  Laterality: N/A;  . LAPAROSCOPIC TUBAL LIGATION Bilateral 06/08/2014   Procedure: LAPAROSCOPIC TUBAL LIGATION;  Surgeon: Daria Pastures, MD;  Location: [facility];  Service: Gynecology;  Laterality: Bilateral;  . TONSILLECTOMY  2001  . TONSILLECTOMY    . TONSILLECTOMY AND ADENOIDECTOMY    . UMBILICAL HERNIA REPAIR  03/2017   x2    Social History:  reports that she has never smoked. She has never used smokeless tobacco. She reports that she  does not drink alcohol or use drugs.  Allergies: Allergies  Allergen Reactions  . Aspirin Hives    Pt states she can take Ibuprofen  . Bactrim Swelling and Rash  . Sulfa Antibiotics Swelling and Rash    Family History:  Family History  Problem Relation Age of Onset  . Hypertension Mother   . Hyperlipidemia Father   . Diabetes Father   . Hypertension Father   . Arthritis Neg Hx        family  . Migraines Neg Hx      Current Outpatient Medications:  .  Belimumab (BENLYSTA IV), Inject into the vein. Every 4 weeks., Disp: , Rfl:  .  Cholecalciferol (VITAMIN D PO), Take by mouth daily., Disp: , Rfl:  .  DULoxetine (CYMBALTA) 20 MG capsule, Take by mouth., Disp: , Rfl:  .  levothyroxine (SYNTHROID) 150 MCG tablet, Take 1 tablet (150 mcg total) by mouth daily before breakfast., Disp: 90 tablet, Rfl: 1 .  Multiple Vitamins-Minerals (MULTIVITAMIN PO), Take by mouth daily., Disp: , Rfl:  .  ondansetron (ZOFRAN) 4 MG tablet, Take 1 tablet (4 mg total) by mouth every 8 (eight) hours as needed for nausea or vomiting., Disp: 20 tablet, Rfl: 0 .  ondansetron (ZOFRAN-ODT) 4 MG disintegrating tablet, Take 1 tablet (4 mg total) by mouth every 8 (eight) hours as needed for nausea., Disp: 30 tablet, Rfl: 3 .  predniSONE (DELTASONE) 5 MG tablet, prednisone 5 mg tablet, Disp: , Rfl:  .  rizatriptan (MAXALT-MLT) 10 MG disintegrating tablet, Take 1 tablet (10 mg total) by mouth as needed for migraine. May repeat in 2 hours if needed, Disp: 9 tablet, Rfl: 11 .  Topiramate ER (TROKENDI XR) 50 MG CP24, Take 50 mg by mouth at bedtime., Disp: 30 capsule, Rfl: 11 .  traMADol (ULTRAM) 50 MG tablet, Take 50 mg by mouth as needed., Disp: , Rfl:  .  pantoprazole (PROTONIX) 40 MG tablet, Take 1 tablet (40 mg total) by mouth daily., Disp: 90 tablet, Rfl: 0  Review of Systems:  Constitutional: Denies fever, chills, diaphoresis, appetite change and fatigue.  HEENT: Denies photophobia, eye pain, redness, hearing  loss, ear pain, congestion, sore throat, rhinorrhea, sneezing, mouth sores, trouble swallowing, neck pain, neck stiffness and tinnitus.   Respiratory: Denies SOB, DOE, cough, chest tightness,  and wheezing.   Cardiovascular: Denies chest pain, palpitations and leg swelling.  Gastrointestinal: Denies nausea, vomiting, abdominal pain, diarrhea, constipation, blood in stool and abdominal distention.  Genitourinary: Denies dysuria, urgency, frequency, hematuria, flank pain and difficulty urinating.  Endocrine: Denies: hot or cold intolerance, sweats, changes in hair or nails, polyuria, polydipsia. Musculoskeletal: Denies myalgias, back pain, joint swelling, arthralgias and gait problem.  Skin: Denies pallor and wound.  Neurological: Denies dizziness, seizures, syncope, weakness, light-headedness, numbness and headaches.  Hematological: Denies adenopathy. Easy bruising, personal or family bleeding history  Psychiatric/Behavioral: Denies suicidal ideation, mood changes, confusion, nervousness, sleep disturbance and agitation    Physical Exam: Vitals:   05/19/19 0928  BP: 102/78  Pulse: 75  Temp: 97.6 F (36.4 C)  TempSrc: Temporal  SpO2: 98%  Weight: 171 lb 9.6 oz (77.8 kg)  Height: 5\' 2"  (1.575 m)   Body mass index is 31.39 kg/m.  Constitutional: NAD, calm, comfortable Eyes: PERRL, lids and conjunctivae normal ENMT: Mucous membranes are moist.  Neck: normal, supple, no masses, no thyromegaly Respiratory: clear to auscultation bilaterally, no wheezing, no crackles. Normal respiratory effort. No accessory muscle use.  Cardiovascular: Regular rate and rhythm, no murmurs / rubs / gallops. No extremity edema. 2+ pedal pulses. No carotid bruits.  Abdomen: no tenderness, no masses palpated. No hepatosplenomegaly. Bowel sounds positive.  Musculoskeletal: no clubbing / cyanosis. No joint deformity upper and lower extremities. Good ROM, no contractures. Normal muscle tone.  Skin: no rashes,  lesions, ulcers. No induration Neurologic: Grossly intact and nonfocal Psychiatric: Normal judgment and insight. Alert and oriented x 3. Normal mood.    Impression and Plan:  Systemic lupus erythematosus, unspecified SLE type, unspecified organ involvement status ([place]) -On daily prednisone and Benlysta every 4 weeks. -Continue follow-up with rheumatology as scheduled.  Obesity (BMI 30.0-34.9) -Discussed healthy lifestyle, including increased physical activity and better food choices to promote weight loss. -Will refer to healthy weight and management per patient request.  Depression, recurrent ([place]) -Mood is stable on low-dose Cymbalta.  Intractable migraine without aura and with status migrainosus -Needs to be on Topamax and rizatriptan, but migraines have improved with treatment of SLE.  Gastroesophageal reflux disease without esophagitis  -Her symptoms of nausea and dyspepsia after eating sound like reflux. -Will place on empiric PPI trial for 12 weeks.  Hypothyroidism, unspecified type -Refilled Synthroid today. -She will return for TSH check with her physical.  Requesting flu shot today.     Patient Instructions  -Nice meeting you today!!  -Start Protonix 40 mg daily.  -Referral to [facility].  -Schedule follow up visit at your convenience for your annual physical. Please come in fasting  that day.  -Flu shot today.     Lelon Frohlich, MD [facility] at [facility]

## 2019-05-25 ENCOUNTER — Encounter: Payer: Self-pay | Admitting: Internal Medicine

## 2019-05-26 ENCOUNTER — Other Ambulatory Visit: Payer: Self-pay | Admitting: Internal Medicine

## 2019-05-26 DIAGNOSIS — K219 Gastro-esophageal reflux disease without esophagitis: Secondary | ICD-10-CM

## 2019-05-26 MED ORDER — PANTOPRAZOLE SODIUM 40 MG PO TBEC: 40.0000 mg | DELAYED_RELEASE_TABLET | Freq: Two times a day (BID) | ORAL | 0 refills | Status: DC

## 2019-06-08 ENCOUNTER — Telehealth (INDEPENDENT_AMBULATORY_CARE_PROVIDER_SITE_OTHER): Payer: BC Managed Care – PPO | Admitting: Internal Medicine

## 2019-06-08 ENCOUNTER — Other Ambulatory Visit: Payer: Self-pay

## 2019-06-08 DIAGNOSIS — J069 Acute upper respiratory infection, unspecified: Secondary | ICD-10-CM

## 2019-06-08 DIAGNOSIS — E039 Hypothyroidism, unspecified: Secondary | ICD-10-CM | POA: Diagnosis not present

## 2019-06-08 MED ORDER — SYNTHROID 150 MCG PO TABS: 150.0000 ug | ORAL_TABLET | Freq: Every day | ORAL | 1 refills | Status: DC

## 2019-06-08 NOTE — Progress Notes (Signed)
Virtual Visit via Video Note  I connected with Debbie [name] on 06/08/19 at  7:00 AM EDT by a video enabled telemedicine application and verified that I am speaking with the correct person using two identifiers.  Location patient: home Location provider: work office Persons participating in the virtual visit: patient, provider  I discussed the limitations of evaluation and management by telemedicine and the availability of in person appointments. The patient expressed understanding and agreed to proceed.   HPI: This was a scheduled CPE, however due to cough was rescheduled as a virtual. She has been having cough for about a week; states this happens routinely after Benlysta injections but seems to be worsening into a "cold". [name] cough is productive of yellow phlegm, no fevers or SOB. No known COVID contacts.  She needs branded synthroid, not generic. Needs new Rx.   ROS: Constitutional: Denies fever, chills, diaphoresis, appetite change and fatigue.  HEENT: Denies photophobia, eye pain, redness, hearing loss, ear pain, sore throat, rhinorrhea, sneezing, mouth sores, trouble swallowing, neck pain, neck stiffness and tinnitus.   Respiratory: Denies SOB, DOE,  chest tightness,  and wheezing.   Cardiovascular: Denies chest pain, palpitations and leg swelling.  Gastrointestinal: Denies nausea, vomiting, abdominal pain, diarrhea, constipation, blood in stool and abdominal distention.  Genitourinary: Denies dysuria, urgency, frequency, hematuria, flank pain and difficulty urinating.  Endocrine: Denies: hot or cold intolerance, sweats, changes in hair or nails, polyuria, polydipsia. Musculoskeletal: Denies myalgias, back pain, joint swelling, arthralgias and gait problem.  Skin: Denies pallor, rash and wound.  Neurological: Denies dizziness, seizures, syncope, weakness, light-headedness, numbness and headaches.  Hematological: Denies adenopathy. Easy bruising, personal or family bleeding  history  Psychiatric/Behavioral: Denies suicidal ideation, mood changes, confusion, nervousness, sleep disturbance and agitation   Past Medical History:  Diagnosis Date  . Anemia   . Chest pain   . Hypothyroidism   . Lupus ([place])   . Migraine   . Migraines   . Systemic lupus ([place])   . Thyroid disease   . UTI (urinary tract infection)   . Vaginal delivery 2002, 2009, 2015  . Varicose veins    Left > than right    Past Surgical History:  Procedure Laterality Date  . APPENDECTOMY    . DILATION AND EVACUATION N/A 04/27/2013   Procedure: DILATATION AND EVACUATION;  Surgeon: Daria Pastures, MD;  Location: [facility];  Service: Gynecology;  Laterality: N/A;  . LAPAROSCOPIC TUBAL LIGATION Bilateral 06/08/2014   Procedure: LAPAROSCOPIC TUBAL LIGATION;  Surgeon: Daria Pastures, MD;  Location: [facility];  Service: Gynecology;  Laterality: Bilateral;  . TONSILLECTOMY  2001  . TONSILLECTOMY    . TONSILLECTOMY AND ADENOIDECTOMY    . UMBILICAL HERNIA REPAIR  03/2017   x2    Family History  Problem Relation Age of Onset  . Hypertension Mother   . Hyperlipidemia Father   . Diabetes Father   . Hypertension Father   . Arthritis Neg Hx        family  . Migraines Neg Hx     SOCIAL HX:   reports that she has never smoked. She has never used smokeless tobacco. She reports that she does not drink alcohol or use drugs.   Current Outpatient Medications:  .  Belimumab (BENLYSTA IV), Inject into the vein. Every 4 weeks., Disp: , Rfl:  .  Cholecalciferol (VITAMIN D PO), Take by mouth daily., Disp: , Rfl:  .  DULoxetine (CYMBALTA) 20 MG capsule, Take by mouth., Disp: ,  Rfl:  .  Multiple Vitamins-Minerals (MULTIVITAMIN PO), Take by mouth daily., Disp: , Rfl:  .  ondansetron (ZOFRAN) 4 MG tablet, Take 1 tablet (4 mg total) by mouth every 8 (eight) hours as needed for nausea or vomiting., Disp: 20 tablet, Rfl: 0 .  ondansetron (ZOFRAN-ODT) 4 MG disintegrating tablet, Take 1 tablet (4 mg total) by  mouth every 8 (eight) hours as needed for nausea., Disp: 30 tablet, Rfl: 3 .  pantoprazole (PROTONIX) 40 MG tablet, Take 1 tablet (40 mg total) by mouth 2 (two) times daily., Disp: 180 tablet, Rfl: 0 .  predniSONE (DELTASONE) 5 MG tablet, prednisone 5 mg tablet, Disp: , Rfl:  .  rizatriptan (MAXALT-MLT) 10 MG disintegrating tablet, Take 1 tablet (10 mg total) by mouth as needed for migraine. May repeat in 2 hours if needed, Disp: 9 tablet, Rfl: 11 .  SYNTHROID 150 MCG tablet, Take 1 tablet (150 mcg total) by mouth daily before breakfast., Disp: 90 tablet, Rfl: 1 .  Topiramate ER (TROKENDI XR) 50 MG CP24, Take 50 mg by mouth at bedtime., Disp: 30 capsule, Rfl: 11 .  traMADol (ULTRAM) 50 MG tablet, Take 50 mg by mouth as needed., Disp: , Rfl:   EXAM:   VITALS per patient if applicable: none reported  GENERAL: alert, oriented, appears well and in no acute distress  HEENT: atraumatic, conjunttiva clear, no obvious abnormalities on inspection of external nose and ears  NECK: normal movements of the head and neck  LUNGS: on inspection no signs of respiratory distress, breathing rate appears normal, no obvious gross increased work of breathing, gasping or wheezing  CV: no obvious cyanosis  MS: moves all visible extremities without noticeable abnormality  PSYCH/NEURO: pleasant and cooperative, no obvious depression or anxiety, speech and thought processing grossly intact  ASSESSMENT AND PLAN:   Upper respiratory tract infection, unspecified type -Advised home isolation, OTC sinus meds and mucinex for about 5 days. -She will contact us if symptoms worsen to consider COVID testing or need for in-person evaluation. -RTC if no improvement in 7-10 days.  Hypothyroidism, unspecified type -Branded synthroid Rx has been sent.     I discussed the assessment and treatment plan with the patient. The patient was provided an opportunity to ask questions and all were answered. The patient agreed  with the plan and demonstrated an understanding of the instructions.   The patient was advised to call back or seek an in-person evaluation if the symptoms worsen or if the condition fails to improve as anticipated.    Lelon Frohlich, MD  [facility] at [facility]

## 2019-06-16 ENCOUNTER — Encounter (HOSPITAL_COMMUNITY): Admission: RE | Admit: 2019-06-16 | Payer: Medicaid Other | Source: Ambulatory Visit

## 2019-06-17 ENCOUNTER — Telehealth: Payer: Self-pay | Admitting: Neurology

## 2019-06-17 ENCOUNTER — Other Ambulatory Visit: Payer: Self-pay | Admitting: Obstetrics and Gynecology

## 2019-06-17 NOTE — H&P (Signed)
38 y.o. [id number] complains of chronic dysmenorrhea and pain outside of periods.  Previously: "Pt. here to discuss surgery; TLH/b-salpingectomy/possible BSO order in; painful and heavy cycles/tb Periods are always painful. Yesterday was very bad and LLQ pain- always on L, sometimes on right. Periods are long but not heavy. Pain in between periods- worst on the week of periods but will have pain off and on for entire cycle. No pain meds are helping. Feeling like pressure to void all the time.  Did not see any endometriosis at [facility] in 2015. Also has pain with sex. Pt has lupus. " "Long discussion with pt regarding pain and bleeding with periods; pain that is out side of periods. We discussed possibilities of endometriosis, adenomyosis, adhesions, tethering, ovarian cysts causing pain and maybe unknown reason. We discussed that taking out just the uterus, tubes and cervix may not completely or even partially resolve pain. We discussed that oopherectomy may help with endometriosis but then commit her to HRT for the rest of her life. We also discussed that the option to leave ovaries even if the cyst ruptures are causing pain is the best plan if no other abnormalities of her ovaries are seen and clear endometriosis is also not seen.   Pt has been in significant pain for a prolonged time and desires definitive. She is done with child bearing. After trying multiple medical modalities, she is prepared for Robo TLH. salpingectomies, cysto and possible BSO, lysis of adhesions."  Past Medical History:  Diagnosis Date  . Anemia   . Chest pain   . Hypothyroidism   . Lupus ([place])   . Migraine   . Migraines   . Systemic lupus ([place])   . Thyroid disease   . UTI (urinary tract infection)   . Vaginal delivery 2002, 2009, 2015  . Varicose veins    Left > than right   Past Surgical History:  Procedure Laterality Date  . APPENDECTOMY    . DILATION AND EVACUATION N/A 04/27/2013   Procedure: DILATATION AND  EVACUATION;  Surgeon: Daria Pastures, MD;  Location: [facility];  Service: Gynecology;  Laterality: N/A;  . LAPAROSCOPIC TUBAL LIGATION Bilateral 06/08/2014   Procedure: LAPAROSCOPIC TUBAL LIGATION;  Surgeon: Daria Pastures, MD;  Location: [facility];  Service: Gynecology;  Laterality: Bilateral;  . TONSILLECTOMY  2001  . TONSILLECTOMY    . TONSILLECTOMY AND ADENOIDECTOMY    . UMBILICAL HERNIA REPAIR  03/2017   x2    Social History   Socioeconomic History  . Marital status: Single    Spouse name: Not on file  . Number of children: 3  . Years of education: College   . Highest education level: Not on file  Occupational History  . Not on file  Social Needs  . Financial resource strain: Not on file  . Food insecurity    Worry: Not on file    Inability: Not on file  . Transportation needs    Medical: Not on file    Non-medical: Not on file  Tobacco Use  . Smoking status: Never Smoker  . Smokeless tobacco: Never Used  Substance and Sexual Activity  . Alcohol use: No  . Drug use: No  . Sexual activity: Not on file  Lifestyle  . Physical activity    Days per week: Not on file    Minutes per session: Not on file  . Stress: Not on file  Relationships  . Social connections    Talks on phone: Not on file  Gets together: Not on file    Attends religious service: Not on file    Active member of club or organization: Not on file    Attends meetings of clubs or organizations: Not on file    Relationship status: Not on file  . Intimate partner violence    Fear of current or ex partner: Not on file    Emotionally abused: Not on file    Physically abused: Not on file    Forced sexual activity: Not on file  Other Topics Concern  . Not on file  Social History Narrative   Lives at home with her children   Caffeine use: Drinks 1 cup coffee per day   Right handed    No current facility-administered medications on file prior to encounter.    Current Outpatient Medications on File  Prior to Encounter  Medication Sig Dispense Refill  . Belimumab (BENLYSTA IV) Inject into the vein. Every 4 weeks.    . Cholecalciferol (VITAMIN D PO) Take 1,000 Units by mouth daily.     . DULoxetine (CYMBALTA) 20 MG capsule Take 20 mg by mouth 2 (two) times daily.     . ondansetron (ZOFRAN-ODT) 4 MG disintegrating tablet Take 1 tablet (4 mg total) by mouth every 8 (eight) hours as needed for nausea. 30 tablet 3  . pantoprazole (PROTONIX) 40 MG tablet Take 1 tablet (40 mg total) by mouth 2 (two) times daily. 180 tablet 0  . RAYOS 5 MG TBEC Take 5 mg by mouth daily. Slow Release Prednisone    . rizatriptan (MAXALT-MLT) 10 MG disintegrating tablet Take 1 tablet (10 mg total) by mouth as needed for migraine. May repeat in 2 hours if needed 9 tablet 11  . traMADol (ULTRAM) 50 MG tablet Take 50 mg by mouth every 12 (twelve) hours as needed (psin).     . ondansetron (ZOFRAN) 4 MG tablet Take 1 tablet (4 mg total) by mouth every 8 (eight) hours as needed for nausea or vomiting. 20 tablet 0    Allergies  Allergen Reactions  . Aspirin Hives    Pt states she can take Ibuprofen  . Bactrim Swelling and Rash  . Sulfa Antibiotics Swelling and Rash    There were no vitals filed for this visit.  Lungs: clear to ascultation Cor:  RRR Abdomen:  soft, nontender, nondistended. Ex:  no cords, erythema Pelvic:   Vulva: no masses, no atrophy, no lesions Vagina: no tenderness, no erythema, no abnormal vaginal discharge, no vesicle(s) or ulcers, no cystocele, no rectocele Cervix: grossly normal, no discharge, no cervical motion tenderness (tender cul de sac.) Uterus: normal size (7), normal shape, midline, no uterine prolapse, tender (very on exam) Bladder/Urethra: normal meatus, no urethral discharge, no urethral mass, bladder non distended, Urethra well supported Adnexa/Parametria: no parametrial tenderness, no parametrial mass, no adnexal tenderness, no ovarian mass  A:  After long discussion with pt  regarding pain and bleeding with periods; pain that is out side of periods. We discussed possibilities of endometriosis, adenomyosis, adhesions, tethering, ovarian cysts causing pain and maybe unknown reason. We discussed that taking out just the uterus, tubes and cervix may not completely or even partially resolve pain. We discussed that oopherectomy may help with endometriosis but then commit her to HRT for the rest of her life. We also discussed that the option to leave ovaries even if the cyst ruptures are causing pain is the best plan if no other abnormalities of her ovaries are seen and clear endometriosis  is also not seen.   Pt has been in significant pain for a prolonged time and desires definitive. She is done with child bearing. After trying multiple medical modalities, she is prepared for Rob[facility] TLH. salpingectomies, cysto and possible BSO, loa.   P: P: All risks, benefits and alternatives d/w patient and she desires to proceed.  Patient has undergone a modified diet, ERAS protocol and will receive preop antibiotics and SCDs during the operation.   Pt to have extended recovery but will go home same day if eating, ambulating, voiding and pain control is good.  Daria Pastures

## 2019-06-17 NOTE — Patient Instructions (Addendum)
YOU ARE SCHEDULED FOR A COVID TEST _9-19-20________@____________ . THIS TEST MUST BE DONE BEFORE SURGERY. GO TO  [address], [place], [address] AND REMAIN IN YOUR CAR, THIS IS A DRIVE UP TEST. ONCE YOUR COVID TEST IS DONE PLEASE FOLLOW ALL THE QUARANTINE  INSTRUCTIONS GIVEN IN YOUR HANDOUT.      Your procedure is scheduled on 06-23-19  Report to [facility].   Call this number if you have problems the morning of surgery  :[phone].   OUR ADDRESS IS [address].  WE ARE LOCATED IN [facility].                                     REMEMBER:  DO NOT EAT FOOD OR DRINK LIQUIDS AFTER MIDNIGHT .   TAKE THESE MEDICATIONS MORNING OF SURGERY WITH A SIP OF WATER:  __synthyroid, rayos, protonix, cymbalta________________________________  IF YOU ARE SPENDING THE NIGHT AFTER SURGERY PLEASE BRING ALL YOUR PRESCRIPTION MEDICATIONS IN THEIR ORIGINAL BOTTLES. 1 VISITOR IS ALLOWED IN WAITING ROOM ONLY DAY OF SURGERY. NO VISITOR MAY SPEND THE NIGHT.                                    DO NOT WEAR JEWERLY, MAKE UP, OR NAIL POLISH,  DO NOT WEAR LOTIONS, POWDERS, PERFUMES OR DEODORANT. DO NOT SHAVE FOR 24 HOURS PRIOR TO DAY OF SURGERY.  CONTACTS, GLASSES, OR DENTURES MAY NOT BE WORN TO SURGERY.                                    [facility] IS NOT RESPONSIBLE  FOR ANY BELONGINGS.                                                                    [facility] - Preparing for Surgery Before surgery, you can play an important role.  Because skin is not sterile, your skin needs to be as free of germs as possible.  You can reduce the number of germs on your skin by washing with CHG (chlorahexidine gluconate) soap before surgery.  CHG is an antiseptic cleaner which kills germs and bonds with the skin to  continue killing germs even after washing. Please DO NOT use if you have an allergy to CHG or antibacterial soaps.  If your skin becomes reddened/irritated stop using the  CHG and inform your nurse when you arrive at Short Stay. Do not shave (including legs and underarms) for at least 48 hours prior to the first CHG shower.  You may shave your face/neck. Please follow these instructions carefully:  1.  Shower with CHG Soap the night before surgery and the  morning of Surgery.  2.  If you choose to wash your hair, wash your hair first as usual with your  normal  shampoo.  3.  After you shampoo, rinse your hair and body thoroughly to remove the  shampoo.                           4.  Use CHG as you would any other liquid soap.  You can apply chg directly  to the skin and wash                       Gently with a scrungie or clean washcloth.  5.  Apply the CHG Soap to your body ONLY FROM THE NECK DOWN.   Do not use on face/ open                           Wound or open sores. Avoid contact with eyes, ears mouth and genitals (private parts).                       Wash face,  Genitals (private parts) with your normal soap.             6.  Wash thoroughly, paying special attention to the area where your surgery  will be performed.  7.  Thoroughly rinse your body with warm water from the neck down.  8.  DO NOT shower/wash with your normal soap after using and rinsing off  the CHG Soap.                9.  Pat yourself dry with a clean towel.            10.  Wear clean pajamas.            11.  Place clean sheets on your bed the night of your first shower and do not  sleep with pets. Day of Surgery : Do not apply any lotions/deodorants the morning of surgery.  Please wear clean clothes to the hospital/surgery center.  FAILURE TO FOLLOW THESE INSTRUCTIONS MAY RESULT IN THE CANCELLATION OF YOUR SURGERY PATIENT SIGNATURE_________________________________  NURSE  SIGNATURE__________________________________  ________________________________________________________________________  WHAT IS A BLOOD TRANSFUSION? Blood Transfusion Information  A transfusion is the replacement of blood or some of its parts. Blood is made up of multiple cells which provide different functions.  Red blood cells carry oxygen and are used for blood loss replacement.  White blood cells fight against infection.  Platelets control bleeding.  Plasma helps clot blood.  Other blood products are available for specialized needs, such as hemophilia or other clotting disorders. BEFORE THE TRANSFUSION  Who gives blood for transfusions?   Healthy volunteers who are fully evaluated to make sure their blood is safe. This is blood bank blood. Transfusion therapy is the safest it has ever been in the practice of medicine. Before blood is taken from a donor, a complete history is taken to make sure that person has no history of diseases nor engages in risky social behavior (examples are intravenous drug  use or sexual activity with multiple partners). The donor's travel history is screened to minimize risk of transmitting infections, such as malaria. The donated blood is tested for signs of infectious diseases, such as HIV and hepatitis. The blood is then tested to be sure it is compatible with you in order to minimize the chance of a transfusion reaction. If you or a relative donates blood, this is often done in anticipation of surgery and is not appropriate for emergency situations. It takes many days to process the donated blood. RISKS AND COMPLICATIONS Although transfusion therapy is very safe and saves many lives, the main dangers of transfusion include:   Getting an infectious disease.  Developing a transfusion reaction. This is an allergic reaction to something in the blood you were given. Every precaution is taken to prevent this. The decision to have a blood transfusion has been  considered carefully by your caregiver before blood is given. Blood is not given unless the benefits outweigh the risks. AFTER THE TRANSFUSION  Right after receiving a blood transfusion, you will usually feel much better and more energetic. This is especially true if your red blood cells have gotten low (anemic). The transfusion raises the level of the red blood cells which carry oxygen, and this usually causes an energy increase.  The nurse administering the transfusion will monitor you carefully for complications. HOME CARE INSTRUCTIONS  No special instructions are needed after a transfusion. You may find your energy is better. Speak with your caregiver about any limitations on activity for underlying diseases you may have. SEEK MEDICAL CARE IF:   Your condition is not improving after your transfusion.  You develop redness or irritation at the intravenous (IV) site. SEEK IMMEDIATE MEDICAL CARE IF:  Any of the following symptoms occur over the next 12 hours:  Shaking chills.  You have a temperature by mouth above 102 F (38.9 C), not controlled by medicine.  Chest, back, or muscle pain.  People around you feel you are not acting correctly or are confused.  Shortness of breath or difficulty breathing.  Dizziness and fainting.  You get a rash or develop hives.  You have a decrease in urine output.  Your urine turns a dark color or changes to pink, red, or brown. Any of the following symptoms occur over the next 10 days:  You have a temperature by mouth above 102 F (38.9 C), not controlled by medicine.  Shortness of breath.  Weakness after normal activity.  The white part of the eye turns yellow (jaundice).  You have a decrease in the amount of urine or are urinating less often.  Your urine turns a dark color or changes to pink, red, or brown. Document Released: 09/13/2000 Document Revised: 12/09/2011 Document Reviewed: 05/02/2008 [facility] Patient Information 2014  [place], [place].  _______________________________________________________________________

## 2019-06-17 NOTE — Telephone Encounter (Signed)
I had Edison Nasuti from Dr. Odette Fraction office call wanting to inform Dr. Jaynee Eagles that Dr. Claudie Revering would like to have a peer to peer to discuss possible restrictions that the pt would possibly have for her Disability Claim. Please advise.

## 2019-06-18 ENCOUNTER — Other Ambulatory Visit: Payer: Self-pay

## 2019-06-18 ENCOUNTER — Encounter (HOSPITAL_COMMUNITY)
Admission: RE | Admit: 2019-06-18 | Discharge: 2019-06-18 | Disposition: A | Discharge status: Home / Self Care | Payer: BC Managed Care – PPO | Source: Ambulatory Visit | Attending: Obstetrics and Gynecology | Admitting: Obstetrics and Gynecology

## 2019-06-18 ENCOUNTER — Encounter (HOSPITAL_COMMUNITY): Payer: Self-pay

## 2019-06-18 DIAGNOSIS — N920 Excessive and frequent menstruation with regular cycle: Secondary | ICD-10-CM | POA: Diagnosis not present

## 2019-06-18 DIAGNOSIS — Z01812 Encounter for preprocedural laboratory examination: Secondary | ICD-10-CM | POA: Diagnosis not present

## 2019-06-18 LAB — CBC
HCT: 35.7 % — ABNORMAL LOW (ref 36.0–46.0)
Hemoglobin: 11.4 g/dL — ABNORMAL LOW (ref 12.0–15.0)
MCH: 23 pg — ABNORMAL LOW (ref 26.0–34.0)
MCHC: 31.9 g/dL (ref 30.0–36.0)
MCV: 72 fL — ABNORMAL LOW (ref 80.0–100.0)
Platelets: 308 10*3/uL (ref 150–400)
RBC: 4.96 MIL/uL (ref 3.87–5.11)
RDW: 16.3 % — ABNORMAL HIGH (ref 11.5–15.5)
WBC: 5.7 10*3/uL (ref 4.0–10.5)
nRBC: 0 % (ref 0.0–0.2)

## 2019-06-18 LAB — ABO/RH: ABO/RH(D): A NEG

## 2019-06-19 ENCOUNTER — Other Ambulatory Visit (HOSPITAL_COMMUNITY)
Admission: RE | Admit: 2019-06-19 | Discharge: 2019-06-19 | Disposition: A | Discharge status: Home / Self Care | Payer: BC Managed Care – PPO | Source: Ambulatory Visit | Attending: Obstetrics and Gynecology | Admitting: Obstetrics and Gynecology

## 2019-06-19 DIAGNOSIS — Z01812 Encounter for preprocedural laboratory examination: Secondary | ICD-10-CM | POA: Insufficient documentation

## 2019-06-19 DIAGNOSIS — Z20828 Contact with and (suspected) exposure to other viral communicable diseases: Secondary | ICD-10-CM | POA: Insufficient documentation

## 2019-06-20 LAB — NOVEL CORONAVIRUS, NAA (HOSP ORDER, SEND-OUT TO REF LAB; TAT 18-24 HRS): SARS-CoV-2, NAA: NOT DETECTED

## 2019-06-21 NOTE — Telephone Encounter (Signed)
I called, left a message. I have not seen patient in almost a year so I cannot make any recommendations. Bethany fyi. thanks

## 2019-06-21 NOTE — Telephone Encounter (Signed)
Noted thanks °

## 2019-06-23 ENCOUNTER — Encounter (HOSPITAL_BASED_OUTPATIENT_CLINIC_OR_DEPARTMENT_OTHER)
Admission: RE | Disposition: A | Discharge status: Home / Self Care | Payer: Self-pay | Source: Home / Self Care | Attending: Obstetrics and Gynecology

## 2019-06-23 ENCOUNTER — Ambulatory Visit (HOSPITAL_BASED_OUTPATIENT_CLINIC_OR_DEPARTMENT_OTHER): Payer: BC Managed Care – PPO | Admitting: Physician Assistant

## 2019-06-23 ENCOUNTER — Encounter (HOSPITAL_BASED_OUTPATIENT_CLINIC_OR_DEPARTMENT_OTHER): Payer: Self-pay | Admitting: *Deleted

## 2019-06-23 ENCOUNTER — Ambulatory Visit (HOSPITAL_BASED_OUTPATIENT_CLINIC_OR_DEPARTMENT_OTHER): Payer: BC Managed Care – PPO | Admitting: Certified Registered"

## 2019-06-23 ENCOUNTER — Ambulatory Visit (HOSPITAL_BASED_OUTPATIENT_CLINIC_OR_DEPARTMENT_OTHER)
Admission: RE | Admit: 2019-06-23 | Discharge: 2019-06-24 | Disposition: A | Discharge status: Home / Self Care | Payer: BC Managed Care – PPO | Attending: Obstetrics and Gynecology | Admitting: Obstetrics and Gynecology

## 2019-06-23 DIAGNOSIS — E669 Obesity, unspecified: Secondary | ICD-10-CM | POA: Insufficient documentation

## 2019-06-23 DIAGNOSIS — Z6831 Body mass index (BMI) 31.0-31.9, adult: Secondary | ICD-10-CM | POA: Insufficient documentation

## 2019-06-23 DIAGNOSIS — G43909 Migraine, unspecified, not intractable, without status migrainosus: Secondary | ICD-10-CM | POA: Diagnosis not present

## 2019-06-23 DIAGNOSIS — Z79899 Other long term (current) drug therapy: Secondary | ICD-10-CM | POA: Diagnosis not present

## 2019-06-23 DIAGNOSIS — N888 Other specified noninflammatory disorders of cervix uteri: Secondary | ICD-10-CM | POA: Insufficient documentation

## 2019-06-23 DIAGNOSIS — N946 Dysmenorrhea, unspecified: Secondary | ICD-10-CM | POA: Diagnosis not present

## 2019-06-23 DIAGNOSIS — N838 Other noninflammatory disorders of ovary, fallopian tube and broad ligament: Secondary | ICD-10-CM | POA: Insufficient documentation

## 2019-06-23 DIAGNOSIS — K219 Gastro-esophageal reflux disease without esophagitis: Secondary | ICD-10-CM | POA: Insufficient documentation

## 2019-06-23 DIAGNOSIS — Z9889 Other specified postprocedural states: Secondary | ICD-10-CM

## 2019-06-23 DIAGNOSIS — M329 Systemic lupus erythematosus, unspecified: Secondary | ICD-10-CM | POA: Diagnosis not present

## 2019-06-23 HISTORY — PX: ROBOTIC ASSISTED LAPAROSCOPIC HYSTERECTOMY AND SALPINGECTOMY: SHX6379

## 2019-06-23 HISTORY — PX: CYSTOSCOPY: SHX5120

## 2019-06-23 LAB — TYPE AND SCREEN
ABO/RH(D): A NEG
Antibody Screen: NEGATIVE

## 2019-06-23 LAB — POCT PREGNANCY, URINE: Preg Test, Ur: NEGATIVE

## 2019-06-23 SURGERY — XI ROBOTIC ASSISTED LAPAROSCOPIC HYSTERECTOMY AND SALPINGECTOMY
Anesthesia: General

## 2019-06-23 MED ORDER — LEVOTHYROXINE SODIUM 150 MCG PO TABS
150.0000 ug | ORAL_TABLET | Freq: Every day | ORAL | Status: DC
  Administered 2019-06-24: 150 ug via ORAL
  Filled 2019-06-23 (×2): qty 1

## 2019-06-23 MED ORDER — ROCURONIUM BROMIDE 10 MG/ML (PF) SYRINGE
PREFILLED_SYRINGE | INTRAVENOUS | Status: AC
  Filled 2019-06-23: qty 10

## 2019-06-23 MED ORDER — FENTANYL CITRATE (PF) 100 MCG/2ML IJ SOLN
INTRAMUSCULAR | Status: DC | PRN
  Administered 2019-06-23: 100 ug via INTRAVENOUS
  Administered 2019-06-23 (×3): 50 ug via INTRAVENOUS

## 2019-06-23 MED ORDER — PREDNISONE 5 MG PO TBEC: 5.0000 mg | DELAYED_RELEASE_TABLET | Freq: Every day | ORAL | Status: DC

## 2019-06-23 MED ORDER — ONDANSETRON HCL 4 MG/2ML IJ SOLN
INTRAMUSCULAR | Status: AC
  Filled 2019-06-23: qty 2

## 2019-06-23 MED ORDER — ROCURONIUM BROMIDE 10 MG/ML (PF) SYRINGE
PREFILLED_SYRINGE | INTRAVENOUS | Status: DC | PRN
  Administered 2019-06-23: 60 mg via INTRAVENOUS

## 2019-06-23 MED ORDER — METHYLENE BLUE 0.5 % INJ SOLN
INTRAVENOUS | Status: DC | PRN
  Administered 2019-06-23: 2 mL via INTRAVENOUS

## 2019-06-23 MED ORDER — FENTANYL CITRATE (PF) 250 MCG/5ML IJ SOLN
INTRAMUSCULAR | Status: AC
  Filled 2019-06-23: qty 5

## 2019-06-23 MED ORDER — OXYCODONE-ACETAMINOPHEN 5-325 MG PO TABS
ORAL_TABLET | ORAL | Status: AC
  Filled 2019-06-23: qty 1

## 2019-06-23 MED ORDER — SCOPOLAMINE 1 MG/3DAYS TD PT72
MEDICATED_PATCH | TRANSDERMAL | Status: AC
  Filled 2019-06-23: qty 1

## 2019-06-23 MED ORDER — RIZATRIPTAN BENZOATE 10 MG PO TBDP: 10.0000 mg | ORAL_TABLET | ORAL | Status: DC | PRN

## 2019-06-23 MED ORDER — ONDANSETRON HCL 4 MG/2ML IJ SOLN
4.0000 mg | Freq: Four times a day (QID) | INTRAMUSCULAR | Status: DC | PRN
  Administered 2019-06-24: 07:00:00 4 mg via INTRAVENOUS
  Filled 2019-06-23: qty 2

## 2019-06-23 MED ORDER — KETOROLAC TROMETHAMINE 15 MG/ML IJ SOLN
15.0000 mg | INTRAMUSCULAR | Status: DC
  Filled 2019-06-23: qty 1

## 2019-06-23 MED ORDER — IBUPROFEN 200 MG PO TABS
ORAL_TABLET | ORAL | Status: AC
  Filled 2019-06-23: qty 4

## 2019-06-23 MED ORDER — PROMETHAZINE HCL 25 MG/ML IJ SOLN
6.2500 mg | INTRAMUSCULAR | Status: DC | PRN
  Filled 2019-06-23: qty 1

## 2019-06-23 MED ORDER — OXYCODONE-ACETAMINOPHEN 5-325 MG PO TABS
1.0000 | ORAL_TABLET | ORAL | Status: DC | PRN
  Administered 2019-06-23 – 2019-06-24 (×4): 1 via ORAL
  Filled 2019-06-23: qty 1

## 2019-06-23 MED ORDER — ACETAMINOPHEN 500 MG PO TABS
ORAL_TABLET | ORAL | Status: AC
  Filled 2019-06-23: qty 2

## 2019-06-23 MED ORDER — PANTOPRAZOLE SODIUM 40 MG PO TBEC
DELAYED_RELEASE_TABLET | ORAL | Status: AC
  Filled 2019-06-23: qty 1

## 2019-06-23 MED ORDER — GABAPENTIN 300 MG PO CAPS
300.0000 mg | ORAL_CAPSULE | ORAL | Status: AC
  Administered 2019-06-23: 300 mg via ORAL
  Filled 2019-06-23: qty 1

## 2019-06-23 MED ORDER — CEFAZOLIN SODIUM-DEXTROSE 2-4 GM/100ML-% IV SOLN
2.0000 g | INTRAVENOUS | Status: AC
  Administered 2019-06-23: 11:00:00 2 g via INTRAVENOUS
  Filled 2019-06-23: qty 100

## 2019-06-23 MED ORDER — CELECOXIB 400 MG PO CAPS
400.0000 mg | ORAL_CAPSULE | ORAL | Status: AC
  Administered 2019-06-23: 400 mg via ORAL
  Filled 2019-06-23: qty 1

## 2019-06-23 MED ORDER — ONDANSETRON HCL 4 MG/2ML IJ SOLN
INTRAMUSCULAR | Status: DC | PRN
  Administered 2019-06-23: 4 mg via INTRAVENOUS

## 2019-06-23 MED ORDER — MEPERIDINE HCL 25 MG/ML IJ SOLN
6.2500 mg | INTRAMUSCULAR | Status: DC | PRN
  Filled 2019-06-23: qty 1

## 2019-06-23 MED ORDER — CELECOXIB 200 MG PO CAPS
ORAL_CAPSULE | ORAL | Status: AC
  Filled 2019-06-23: qty 2

## 2019-06-23 MED ORDER — MENTHOL 3 MG MT LOZG
1.0000 | LOZENGE | OROMUCOSAL | Status: DC | PRN
  Administered 2019-06-23: 3 mg via ORAL
  Filled 2019-06-23: qty 9

## 2019-06-23 MED ORDER — DEXAMETHASONE SODIUM PHOSPHATE 10 MG/ML IJ SOLN
INTRAMUSCULAR | Status: AC
  Filled 2019-06-23: qty 1

## 2019-06-23 MED ORDER — PROPOFOL 10 MG/ML IV BOLUS
INTRAVENOUS | Status: AC
  Filled 2019-06-23: qty 20

## 2019-06-23 MED ORDER — HYDROMORPHONE HCL 1 MG/ML IJ SOLN
INTRAMUSCULAR | Status: AC
  Filled 2019-06-23: qty 1

## 2019-06-23 MED ORDER — GABAPENTIN 300 MG PO CAPS
ORAL_CAPSULE | ORAL | Status: AC
  Filled 2019-06-23: qty 1

## 2019-06-23 MED ORDER — PANTOPRAZOLE SODIUM 40 MG PO TBEC
40.0000 mg | DELAYED_RELEASE_TABLET | Freq: Two times a day (BID) | ORAL | Status: DC
  Administered 2019-06-23: 40 mg via ORAL
  Filled 2019-06-23: qty 1

## 2019-06-23 MED ORDER — PROPOFOL 10 MG/ML IV BOLUS
INTRAVENOUS | Status: DC | PRN
  Administered 2019-06-23: 180 mg via INTRAVENOUS

## 2019-06-23 MED ORDER — IBUPROFEN 800 MG PO TABS
800.0000 mg | ORAL_TABLET | Freq: Three times a day (TID) | ORAL | Status: DC
  Administered 2019-06-23 – 2019-06-24 (×3): 800 mg via ORAL
  Filled 2019-06-23: qty 1

## 2019-06-23 MED ORDER — LACTATED RINGERS IV SOLN
INTRAVENOUS | Status: DC
  Administered 2019-06-23: 11:00:00 via INTRAVENOUS
  Filled 2019-06-23: qty 1000

## 2019-06-23 MED ORDER — SUGAMMADEX SODIUM 200 MG/2ML IV SOLN
INTRAVENOUS | Status: DC | PRN
  Administered 2019-06-23: 175 mg via INTRAVENOUS

## 2019-06-23 MED ORDER — SCOPOLAMINE 1 MG/3DAYS TD PT72
1.0000 | MEDICATED_PATCH | Freq: Once | TRANSDERMAL | Status: DC
  Administered 2019-06-23: 1.5 mg via TRANSDERMAL
  Filled 2019-06-23: qty 1

## 2019-06-23 MED ORDER — DULOXETINE HCL 20 MG PO CPEP
20.0000 mg | ORAL_CAPSULE | Freq: Two times a day (BID) | ORAL | Status: DC
  Administered 2019-06-23: 20 mg via ORAL
  Filled 2019-06-23 (×3): qty 1

## 2019-06-23 MED ORDER — ONDANSETRON HCL 4 MG PO TABS
4.0000 mg | ORAL_TABLET | Freq: Four times a day (QID) | ORAL | Status: DC | PRN
  Filled 2019-06-23: qty 1

## 2019-06-23 MED ORDER — MIDAZOLAM HCL 2 MG/2ML IJ SOLN
0.5000 mg | Freq: Once | INTRAMUSCULAR | Status: DC | PRN
  Filled 2019-06-23: qty 2

## 2019-06-23 MED ORDER — LIDOCAINE 2% (20 MG/ML) 5 ML SYRINGE
INTRAMUSCULAR | Status: DC | PRN
  Administered 2019-06-23: 60 mg via INTRAVENOUS

## 2019-06-23 MED ORDER — LIDOCAINE 2% (20 MG/ML) 5 ML SYRINGE
INTRAMUSCULAR | Status: AC
  Filled 2019-06-23: qty 5

## 2019-06-23 MED ORDER — ACETAMINOPHEN 500 MG PO TABS
1000.0000 mg | ORAL_TABLET | ORAL | Status: AC
  Administered 2019-06-23: 1000 mg via ORAL
  Filled 2019-06-23: qty 2

## 2019-06-23 MED ORDER — HYDROMORPHONE HCL 1 MG/ML IJ SOLN
0.2500 mg | INTRAMUSCULAR | Status: DC | PRN
  Administered 2019-06-23: 0.25 mg via INTRAVENOUS
  Administered 2019-06-23: 0.5 mg via INTRAVENOUS
  Administered 2019-06-23 (×2): 0.25 mg via INTRAVENOUS
  Administered 2019-06-23: 0.5 mg via INTRAVENOUS
  Administered 2019-06-23: 0.25 mg via INTRAVENOUS
  Administered 2019-06-23: 0.5 mg via INTRAVENOUS
  Filled 2019-06-23: qty 0.5

## 2019-06-23 MED ORDER — KETAMINE HCL 10 MG/ML IJ SOLN
INTRAMUSCULAR | Status: DC | PRN
  Administered 2019-06-23: 30 mg via INTRAVENOUS

## 2019-06-23 MED ORDER — LACTATED RINGERS IV SOLN
INTRAVENOUS | Status: DC
  Administered 2019-06-23: 15:00:00 via INTRAVENOUS
  Filled 2019-06-23 (×2): qty 1000

## 2019-06-23 MED ORDER — 0.9 % SODIUM CHLORIDE (POUR BTL) OPTIME
TOPICAL | Status: DC | PRN
  Administered 2019-06-23: 1000 mL

## 2019-06-23 MED ORDER — OXYCODONE-ACETAMINOPHEN 5-325 MG PO TABS: 1.0000 | ORAL_TABLET | ORAL | 0 refills | Status: DC | PRN

## 2019-06-23 MED ORDER — SOD CITRATE-CITRIC ACID 500-334 MG/5ML PO SOLN
30.0000 mL | ORAL | Status: DC
  Filled 2019-06-23: qty 30

## 2019-06-23 MED ORDER — SODIUM CHLORIDE 0.9 % IV SOLN
INTRAVENOUS | Status: DC | PRN
  Administered 2019-06-23: 60 mL

## 2019-06-23 MED ORDER — BUTORPHANOL TARTRATE 2 MG/ML IJ SOLN
2.0000 mg | Freq: Once | INTRAMUSCULAR | Status: AC
  Administered 2019-06-23: 2 mg via INTRAVENOUS
  Filled 2019-06-23: qty 1

## 2019-06-23 MED ORDER — CEFAZOLIN SODIUM-DEXTROSE 2-4 GM/100ML-% IV SOLN
INTRAVENOUS | Status: AC
  Filled 2019-06-23: qty 100

## 2019-06-23 MED ORDER — MIDAZOLAM HCL 5 MG/5ML IJ SOLN
INTRAMUSCULAR | Status: DC | PRN
  Administered 2019-06-23: 2 mg via INTRAVENOUS

## 2019-06-23 MED ORDER — BUTORPHANOL TARTRATE 2 MG/ML IJ SOLN
INTRAMUSCULAR | Status: AC
  Filled 2019-06-23: qty 1

## 2019-06-23 MED ORDER — LIDOCAINE 2% (20 MG/ML) 5 ML SYRINGE
INTRAMUSCULAR | Status: DC | PRN
  Administered 2019-06-23: 1.5 mg/kg/h via INTRAVENOUS

## 2019-06-23 MED ORDER — SODIUM CHLORIDE 0.9 % IR SOLN
Status: DC | PRN
  Administered 2019-06-23: 3000 mL

## 2019-06-23 MED ORDER — METHYLENE BLUE 0.5 % INJ SOLN
INTRAVENOUS | Status: AC
  Filled 2019-06-23: qty 10

## 2019-06-23 MED ORDER — DEXAMETHASONE SODIUM PHOSPHATE 10 MG/ML IJ SOLN
INTRAMUSCULAR | Status: DC | PRN
  Administered 2019-06-23: 10 mg via INTRAVENOUS

## 2019-06-23 MED ORDER — MENTHOL 3 MG MT LOZG
LOZENGE | OROMUCOSAL | Status: AC
  Filled 2019-06-23: qty 9

## 2019-06-23 MED ORDER — MIDAZOLAM HCL 2 MG/2ML IJ SOLN
INTRAMUSCULAR | Status: AC
  Filled 2019-06-23: qty 2

## 2019-06-23 MED ORDER — GABAPENTIN 100 MG PO CAPS
100.0000 mg | ORAL_CAPSULE | Freq: Two times a day (BID) | ORAL | Status: DC
  Administered 2019-06-23: 21:00:00 100 mg via ORAL
  Filled 2019-06-23 (×2): qty 1

## 2019-06-23 SURGICAL SUPPLY — 53 items
ADH SKN CLS APL DERMABOND .7 (GAUZE/BANDAGES/DRESSINGS) ×2
APL SRG 38 LTWT LNG FL B (MISCELLANEOUS) ×2
APPLICATOR ARISTA FLEXITIP XL (MISCELLANEOUS) ×1 IMPLANT
BARRIER ADHS 3X4 INTERCEED (GAUZE/BANDAGES/DRESSINGS) IMPLANT
BRR ADH 4X3 ABS CNTRL BYND (GAUZE/BANDAGES/DRESSINGS)
CANISTER SUCT 3000ML PPV (MISCELLANEOUS) ×3 IMPLANT
COVER BACK TABLE 60X90IN (DRAPES) ×3 IMPLANT
COVER TIP SHEARS 8 DVNC (MISCELLANEOUS) ×2 IMPLANT
COVER TIP SHEARS 8MM DA VINCI (MISCELLANEOUS) ×1
DECANTER SPIKE VIAL GLASS SM (MISCELLANEOUS) ×3 IMPLANT
DEFOGGER SCOPE WARMER CLEARIFY (MISCELLANEOUS) ×3 IMPLANT
DERMABOND ADVANCED (GAUZE/BANDAGES/DRESSINGS) ×1
DERMABOND ADVANCED .7 DNX12 (GAUZE/BANDAGES/DRESSINGS) ×2 IMPLANT
DRAPE ARM DVNC X/XI (DISPOSABLE) ×8 IMPLANT
DRAPE COLUMN DVNC XI (DISPOSABLE) ×2 IMPLANT
DRAPE DA VINCI XI ARM (DISPOSABLE) ×4
DRAPE DA VINCI XI COLUMN (DISPOSABLE) ×1
DURAPREP 26ML APPLICATOR (WOUND CARE) ×3 IMPLANT
ELECT REM PT RETURN 9FT ADLT (ELECTROSURGICAL) ×3
ELECTRODE REM PT RTRN 9FT ADLT (ELECTROSURGICAL) ×2 IMPLANT
GLOVE BIO SURGEON STRL SZ7 (GLOVE) ×9 IMPLANT
GLOVE BIOGEL PI IND STRL 7.0 (GLOVE) ×4 IMPLANT
GLOVE BIOGEL PI INDICATOR 7.0 (GLOVE) ×2
HEMOSTAT ARISTA ABSORB 3G PWDR (HEMOSTASIS) ×1 IMPLANT
IRRIG SUCT STRYKERFLOW 2 WTIP (MISCELLANEOUS) ×3
IRRIGATION SUCT STRKRFLW 2 WTP (MISCELLANEOUS) ×2 IMPLANT
LEGGING LITHOTOMY PAIR STRL (DRAPES) ×3 IMPLANT
MANIPULATOR ADVINCU DEL 3.5 PL (MISCELLANEOUS) ×1 IMPLANT
NEEDLE INSUFFLATION 120MM (ENDOMECHANICALS) ×3 IMPLANT
OBTURATOR OPTICAL STANDARD 8MM (TROCAR) ×1
OBTURATOR OPTICAL STND 8 DVNC (TROCAR) ×2
OBTURATOR OPTICALSTD 8 DVNC (TROCAR) ×2 IMPLANT
PACK ROBOT WH (CUSTOM PROCEDURE TRAY) ×3 IMPLANT
PACK ROBOTIC GOWN (GOWN DISPOSABLE) ×3 IMPLANT
PACK TRENDGUARD 450 HYBRID PRO (MISCELLANEOUS) IMPLANT
PAD PREP 24X48 CUFFED NSTRL (MISCELLANEOUS) ×3 IMPLANT
POUCH LAPAROSCOPIC INSTRUMENT (MISCELLANEOUS) ×3 IMPLANT
PROTECTOR NERVE ULNAR (MISCELLANEOUS) ×6 IMPLANT
SEAL CANN UNIV 5-8 DVNC XI (MISCELLANEOUS) ×6 IMPLANT
SEAL XI 5MM-8MM UNIVERSAL (MISCELLANEOUS) ×3
SET CYSTO W/LG BORE CLAMP LF (SET/KITS/TRAYS/PACK) ×3 IMPLANT
SET TRI-LUMEN FLTR TB AIRSEAL (TUBING) ×3 IMPLANT
SUT VIC AB 0 CT1 36 (SUTURE) IMPLANT
SUT VIC AB 2-0 UR6 27 (SUTURE) ×3 IMPLANT
SUT VICRYL RAPIDE 3 0 (SUTURE) ×6 IMPLANT
SUT VLOC 180 0 9IN  GS21 (SUTURE) ×1
SUT VLOC 180 0 9IN GS21 (SUTURE) ×2 IMPLANT
TOWEL OR 17X26 10 PK STRL BLUE (TOWEL DISPOSABLE) ×6 IMPLANT
TRAY FOLEY W/BAG SLVR 14FR (SET/KITS/TRAYS/PACK) ×3 IMPLANT
TRENDGUARD 450 HYBRID PRO PACK (MISCELLANEOUS) ×3
TROCAR BLADELESS OPT 5 100 (ENDOMECHANICALS) IMPLANT
TROCAR PORT AIRSEAL 5X120 (TROCAR) ×3 IMPLANT
WATER STERILE IRR 1000ML POUR (IV SOLUTION) ×3 IMPLANT

## 2019-06-23 NOTE — Anesthesia Preprocedure Evaluation (Addendum)
Anesthesia Evaluation  Patient identified by MRN, date of birth, ID band Patient awake    Reviewed: Allergy & Precautions, NPO status , Patient's Chart, lab work & pertinent test results  History of Anesthesia Complications Negative for: history of anesthetic complications  Airway Mallampati: I  TM Distance: >3 FB Neck ROM: Full    Dental  (+) Teeth Intact, Dental Advisory Given   Pulmonary neg pulmonary ROS,  06/19/2019 SARS coronavirus NEG   breath sounds clear to auscultation       Cardiovascular negative cardio ROS   Rhythm:Regular Rate:Normal     Neuro/Psych  Headaches, Depression    GI/Hepatic Neg liver ROS, GERD  Controlled and Medicated,  Endo/Other  Hypothyroidism Lupus: steroids (took today) obesity  Renal/GU negative Renal ROS     Musculoskeletal   Abdominal   Peds  Hematology negative hematology ROS (+)   Anesthesia Other Findings   Reproductive/Obstetrics                            Anesthesia Physical Anesthesia Plan  ASA: II  Anesthesia Plan: General   Post-op Pain Management:    Induction: Intravenous  PONV Risk Score and Plan: 4 or greater and Scopolamine patch - Pre-op, Dexamethasone and Ondansetron  Airway Management Planned: Oral ETT  Additional Equipment:   Intra-op Plan:   Post-operative Plan: Extubation in OR  Informed Consent: I have reviewed the patients History and Physical, chart, labs and discussed the procedure including the risks, benefits and alternatives for the proposed anesthesia with the patient or authorized representative who has indicated his/her understanding and acceptance.     Dental advisory given  Plan Discussed with: CRNA and Surgeon  Anesthesia Plan Comments:         Anesthesia Quick Evaluation

## 2019-06-23 NOTE — Anesthesia Postprocedure Evaluation (Signed)
Anesthesia Post Note  Patient: Sukhmani Halterman  Procedure(s) Performed: XI ROBOTIC ASSISTED LAPAROSCOPIC HYSTERECTOMY AND SALPINGECTOMY (Bilateral ) CYSTOSCOPY (N/A )     Patient location during evaluation: PACU Anesthesia Type: General Level of consciousness: sedated, patient cooperative and oriented Pain management: pain level controlled Vital Signs Assessment: post-procedure vital signs reviewed and stable Respiratory status: spontaneous breathing, nonlabored ventilation and respiratory function stable Cardiovascular status: blood pressure returned to baseline and stable Postop Assessment: no apparent nausea or vomiting Anesthetic complications: no    Last Vitals:  Vitals:   06/23/19 1345 06/23/19 1400  BP: 112/71 91/76  Pulse: 64 (!) 59  Resp: 14 10  Temp:    SpO2: 100% 100%    Last Pain:  Vitals:   06/23/19 1430  TempSrc:   PainSc: Asleep                 Maela Takeda,E. Pascale Maves

## 2019-06-23 NOTE — Progress Notes (Signed)
There has been no change in the patients history, status or exam since the history and physical.  Vitals:   06/23/19 1039  BP: 120/81  Pulse: 68  Resp: 16  Temp: (!) 97.5 F (36.4 C)  TempSrc: Oral  SpO2: 100%  Weight: 77.7 kg  Height: 5\' 2"  (1.575 m)    Results for orders placed or performed during the hospital encounter of 06/23/19 (from the past 72 hour(s))  Pregnancy, urine POC     Status: None   Collection Time: 06/23/19 10:37 AM  Result Value Ref Range   Preg Test, Ur NEGATIVE NEGATIVE    Comment:        THE SENSITIVITY OF THIS METHODOLOGY IS >24 mIU/mL     Debbie [name]

## 2019-06-23 NOTE — Discharge Summary (Signed)
Physician Discharge Summary  Patient ID: Debbie [name] MRN: [id number] DOB/AGE: Dec 02, 1980 38 y.o.  Admit date: 06/23/2019 Discharge date: 06/23/2019  Admission Diagnoses:menorrhagia and R&LLQ pain  Discharge Diagnoses: same Active Problems:   Postoperative state   Discharged Condition: good  Hospital Course: Uncomplicated robotic TLH with salpingectomies, cysto  Consults: None  Significant Diagnostic Studies: none  Treatments: surgery:  robotic TLH with salpingectomies, cysto  Discharge Exam: Blood pressure 120/81, pulse 68, temperature (!) 97.5 F (36.4 C), temperature source Oral, resp. rate 16, height 5\' 2"  (1.575 m), weight 77.7 kg, last menstrual period 05/17/2019, SpO2 100 %.   Disposition: Discharge disposition: 01-Home or Self Care       Discharge Instructions    Call MD for:  temperature >100.4   Complete by: As directed    Diet - low sodium heart healthy   Complete by: As directed    Discharge instructions   Complete by: As directed    No driving on narcotics, no sexual activity for 2 weeks.   Increase activity slowly   Complete by: As directed    May shower / Bathe   Complete by: As directed    Shower, no bath for 2 weeks.   Remove dressing in 24 hours   Complete by: As directed    Sexual Activity Restrictions   Complete by: As directed    No sexual activity for 2 weeks.     Allergies as of 06/23/2019      Reactions   Aspirin Hives   Pt states she can take Ibuprofen   Bactrim Swelling, Rash   Sulfa Antibiotics Swelling, Rash      Medication List    STOP taking these medications   traMADol 50 MG tablet Commonly known as: ULTRAM     TAKE these medications   BENLYSTA IV Inject into the vein. Every 4 weeks.   DULoxetine 20 MG capsule Commonly known as: CYMBALTA Take 20 mg by mouth 2 (two) times daily.   ondansetron 4 MG disintegrating tablet Commonly known as: ZOFRAN-ODT Take 1 tablet (4 mg total) by mouth every 8 (eight) hours as  needed for nausea.   ondansetron 4 MG tablet Commonly known as: Zofran Take 1 tablet (4 mg total) by mouth every 8 (eight) hours as needed for nausea or vomiting.   oxyCODONE-acetaminophen 5-325 MG tablet Commonly known as: PERCOCET/ROXICET Take 1 tablet by mouth every 4 (four) hours as needed for moderate pain.   pantoprazole 40 MG tablet Commonly known as: PROTONIX Take 1 tablet (40 mg total) by mouth 2 (two) times daily.   Rayos 5 MG Tbec Generic drug: predniSONE Take 5 mg by mouth daily. Slow Release Prednisone   rizatriptan 10 MG disintegrating tablet Commonly known as: MAXALT-MLT Take 1 tablet (10 mg total) by mouth as needed for migraine. May repeat in 2 hours if needed   Synthroid 150 MCG tablet Generic drug: levothyroxine Take 1 tablet (150 mcg total) by mouth daily before breakfast.   VITAMIN D PO Take 1,000 Units by mouth daily.      Follow-up Information    Bobbye Charleston, MD Follow up in 2 week(s).   Specialty: Obstetrics and Gynecology Contact information: [address] [place] [address] [phone]           Signed: Daria Pastures 06/23/2019, 1:27 PM

## 2019-06-23 NOTE — Anesthesia Procedure Notes (Signed)
Procedure Name: Intubation Date/Time: 06/23/2019 11:13 AM Performed by: Avriana Joo D, CRNA Pre-anesthesia Checklist: Patient identified, Emergency Drugs available, Suction available and Patient being monitored Patient Re-evaluated:Patient Re-evaluated prior to induction Oxygen Delivery Method: Circle system utilized Preoxygenation: Pre-oxygenation with 100% oxygen Induction Type: IV induction Ventilation: Mask ventilation without difficulty Laryngoscope Size: Mac and 4 Tube type: Oral Tube size: 7.0 mm Number of attempts: 1 Airway Equipment and Method: Stylet Placement Confirmation: ETT inserted through vocal cords under direct vision,  positive ETCO2 and breath sounds checked- equal and bilateral Secured at: 7 cm Tube secured with: Tape Dental Injury: Teeth and Oropharynx as per pre-operative assessment

## 2019-06-23 NOTE — Brief Op Note (Signed)
06/23/2019  1:15 PM  PATIENT:  Debbie [name]  38 y.o. female  PRE-OPERATIVE DIAGNOSIS:  DYSMENORRHEA  POST-OPERATIVE DIAGNOSIS:  DYSMENORRHEA  PROCEDURE:  Procedure(s) with comments: XI ROBOTIC ASSISTED LAPAROSCOPIC HYSTERECTOMY AND SALPINGECTOMY (Bilateral) - TS RNFA confirmed on 06/08/19 CS CYSTOSCOPY (N/A)  SURGEON:  Surgeon(s) and Role:    * Bobbye Charleston, MD - Primary    * Taam-Akelman, Lawrence Santiago, MD - Assisting  ANESTHESIA:   general  EBL:  50 mL   LOCAL MEDICATIONS USED:  OTHER Ropivicaine, arrista, indigo carmine  SPECIMEN:  Source of Specimen:  uterus, cervix, bilateral tubes  DISPOSITION OF SPECIMEN:  PATHOLOGY  COUNTS:  YES  TOURNIQUET:  * No tourniquets in log *  DICTATION: .Note written in [place]: Admit for overnight observation  PATIENT DISPOSITION:  PACU - hemodynamically stable.   Delay start of Pharmacological VTE agent (>24hrs) due to surgical blood loss or risk of bleeding: not applicable

## 2019-06-23 NOTE — Op Note (Signed)
06/23/2019  1:15 PM  PATIENT:  Debbie [name]  38 y.o. female  PRE-OPERATIVE DIAGNOSIS:  DYSMENORRHEA  POST-OPERATIVE DIAGNOSIS:  DYSMENORRHEA  PROCEDURE:  Procedure(s) with comments: XI ROBOTIC ASSISTED LAPAROSCOPIC HYSTERECTOMY AND SALPINGECTOMY (Bilateral) - TS RNFA confirmed on 06/08/19 CS CYSTOSCOPY (N/A)  SURGEON:  Surgeon(s) and Role:    * Bobbye Charleston, MD - Primary    * Taam-Akelman, Lawrence Santiago, MD - Assisting  ANESTHESIA:   general  EBL:  50 mL   LOCAL MEDICATIONS USED:  OTHER Ropivicaine, arrista, indigo carmine  SPECIMEN:  Source of Specimen:  uterus, cervix, bilateral tubes  DISPOSITION OF SPECIMEN:  PATHOLOGY  COUNTS:  YES  TOURNIQUET:  * No tourniquets in log *  DICTATION: .Note written in [place]: Admit for overnight observation  PATIENT DISPOSITION:  PACU - hemodynamically stable.   Delay start of Pharmacological VTE agent (>24hrs) due to surgical blood loss or risk of bleeding: not applicable  Complications:  None.  Findings:  9 weeks size uterus.  Ovaries were normal.  The ureters were identified during multiple points of the case and were always out of the field of dissection.  On cystoscopy, the bladder was intact and bilateral spill was seen from each ureteral orriface.    Medications:  Ancef.  Bupivicaine.      Technique:   After adequate anesthesia was achieved the patient was positioned, prepped and draped in usual sterile fashion.  A speculum was placed in the vagina and the cervix dilated with pratt dilators.  The 3.5 cm Koh ring Advincula was assembled and placed in proper fashion.  The  Speculum was removed and the bladder catheterized with a foley.     Attention was turned to the abdomen where a 1 cm incision was made 1 cm above the umbilicus.  The veress needle was introduced without aspiration of bowel contents or blood and the abdomen insufflated. The 8.5 mm Robotic trocar was placed and the other three trocar sites were  marked out, all approximately 10 cm from each other and the camera.  Two 8.5 mm trocars were placed on either side of the camera port and a 5 mm assistant port was placed 3 cm above the line of the other trocars.  All trocars were inserted under direct visualization of the camera.  The patient was placed in trendelenburg and then the Robot docked.  The fenestrated bipolar were placed on arm 1 and the Hot shears on arm 3 and introduced under direct visualization of the camera.   I then broke scrub and sat down at the console.  The above findings were noted and the ureters identified well out of the field of dissection.  The right fallopian tube was isolated and cauterized with the bipolar.  The Utero-ovarian ligament was then divided with the bipolar cautery and shears.  The posterior broad ligament was then divided with the hot shears until the uterosacral ligament.  The Broad and cardinal ligaments were then cauterized against the cervix to the level of the Koh ring, securing the uterine artery.  Each pedicle was then incised with the shears.  The anterior leaf was then incised at the reflection of the vessico-uterine junction and the lateral bladder retracted inferiorly after the round ligament had been divided with the bipolar forceps.  The left tube was cauterized with the bipolar and divided with the shears;  then the left utero-ovarian ligament divided with the bipolar forceps and the scissors.  Both filschie clips were removed  from the abdomen.  The round ligament was divided as well and the posterior leaf of the broad ligament then divided with the hot shears. The broad and cardinal ligaments were then cauterized on the left in the same way.   At the level of the internal os, the uterine arteries were bilaterally cauterized with the bipolar.  The ureters were identified well out of the field of dissection.     The bladder was then able to be retracted inferiorly and the vesico-uterine fascia was incised  in the midline until the bladder was removed one cm below the Koh ring.  The hot shears then circumferentially incised the vagina at the level of the reflection on the [facility] ring.  Once the uterus and cervix were amputated, cautery was used to insure hemostasis of the cuff.  Once hemostasis was achieved, the scissors were changed to the mega suture cut needle driver and the cuff was closed with a running stitches of 0-vicryl V loc.  Cautery was used to ensure hemostasis of the left pedicles very superficially. The ureters were peristalsing bilaterally well and very lateral to the areas of operation.     The Robot was then undocked and I scrubbed back in.  The needle was removed and Arrista for some scant oozing and Ropivicaine was introduced into the pelvis. The skin incisions were closed with subcuticular stitches of 3-0 vicryl Rapide and Dermabond.  All instruments were removed from the vagina. The vagina was intact and cystoscopy was performed, revealing an intact bladder and vigourous spill of urine from each ureteral orifice.  The cystoscope was removed and the patient taken to the recovery room in stable condition.   Rett Stehlik A

## 2019-06-23 NOTE — Transfer of Care (Signed)
Immediate Anesthesia Transfer of Care Note  Patient: Debbie [name]  Procedure(s) Performed: XI ROBOTIC ASSISTED LAPAROSCOPIC HYSTERECTOMY AND SALPINGECTOMY (Bilateral ) CYSTOSCOPY (N/A )  Patient Location: PACU  Anesthesia Type:General  Level of Consciousness: awake, alert  and oriented  Airway & Oxygen Therapy: Patient Spontanous Breathing and Patient connected to nasal cannula oxygen  Post-op Assessment: Report given to RN and Post -op Vital signs reviewed and stable  Post vital signs: Reviewed and stable  Last Vitals:  Vitals Value Taken Time  BP    Temp    Pulse 71 06/23/19 1327  Resp 16 06/23/19 1327  SpO2 99 % 06/23/19 1327  Vitals shown include unvalidated device data.  Last Pain:  Vitals:   06/23/19 1039  TempSrc: Oral  PainSc: 3       Patients Stated Pain Goal: 5 ([id number])  Complications: No apparent anesthesia complications

## 2019-06-24 DIAGNOSIS — N946 Dysmenorrhea, unspecified: Secondary | ICD-10-CM | POA: Diagnosis not present

## 2019-06-24 MED ORDER — ONDANSETRON HCL 4 MG/2ML IJ SOLN
INTRAMUSCULAR | Status: AC
  Filled 2019-06-24: qty 2

## 2019-06-24 MED ORDER — OXYCODONE-ACETAMINOPHEN 5-325 MG PO TABS
ORAL_TABLET | ORAL | Status: AC
  Filled 2019-06-24: qty 1

## 2019-06-24 MED ORDER — IBUPROFEN 200 MG PO TABS
ORAL_TABLET | ORAL | Status: AC
  Filled 2019-06-24: qty 4

## 2019-06-24 MED ORDER — IBUPROFEN 800 MG PO TABS: 800.0000 mg | ORAL_TABLET | Freq: Three times a day (TID) | ORAL | 0 refills | Status: DC

## 2019-06-24 NOTE — Progress Notes (Signed)
Patient stated her nausea subsided and that she wanted to go home before 9am. Patient and husband given discharge instructions. Patient's vitals WNL

## 2019-06-24 NOTE — Progress Notes (Signed)
Patient is eating, ambulating, and voiding.  Pain control is good.  Vitals:   06/23/19 1643 06/23/19 2126 06/24/19 0150 06/24/19 0544  BP: 115/70 96/60 (!) 91/48 (!) 99/48  Pulse: 65 64 67 72  Resp: 16 16 16 18   Temp: 97.6 F (36.4 C) 98.6 F (37 C) 98.3 F (36.8 C) 98.6 F (37 C)  TempSrc:      SpO2: 100% 100% 97% 97%  Weight:      Height:        lungs:   clear to auscultation cor:    RRR Abdomen:  soft, appropriate tenderness, incisions intact and without erythema or exudate. ex:    no cords   Lab Results  Component Value Date   WBC 5.7 06/18/2019   HGB 11.4 (L) 06/18/2019   HCT 35.7 (L) 06/18/2019   MCV 72.0 (L) 06/18/2019   PLT 308 06/18/2019    A/P  Routine care.  Expect d/c per plan.

## 2019-06-24 NOTE — Progress Notes (Signed)
This morning after patient got up to wash, she stated she started feeling nauseated. Patient given IV zofran and told to try to eat. Patient was able to eat one pancake and a few bites of pudding. She states after eating she still felt nauseated, so MD notified. Md stated to keep her here until 9am and then let her know to call if she has any questions or concerns.

## 2019-06-24 NOTE — Discharge Instructions (Signed)

## 2019-06-25 ENCOUNTER — Encounter (HOSPITAL_BASED_OUTPATIENT_CLINIC_OR_DEPARTMENT_OTHER): Payer: Self-pay | Admitting: Obstetrics and Gynecology

## 2019-06-25 LAB — SURGICAL PATHOLOGY

## 2019-06-29 ENCOUNTER — Encounter: Payer: Self-pay | Admitting: Internal Medicine

## 2019-07-07 ENCOUNTER — Emergency Department (HOSPITAL_COMMUNITY)
Admission: EM | Admit: 2019-07-07 | Discharge: 2019-07-07 | Disposition: A | Discharge status: Home / Self Care | Payer: Medicaid Other | Attending: Emergency Medicine | Admitting: Emergency Medicine

## 2019-07-07 ENCOUNTER — Other Ambulatory Visit: Payer: Self-pay

## 2019-07-07 ENCOUNTER — Encounter (HOSPITAL_COMMUNITY): Payer: Self-pay | Admitting: Emergency Medicine

## 2019-07-07 ENCOUNTER — Inpatient Hospital Stay (HOSPITAL_COMMUNITY)
Admission: AD | Admit: 2019-07-07 | Discharge: 2019-07-09 | DRG: 948 | Disposition: A | Discharge status: Home / Self Care | Payer: Medicaid Other | Source: Ambulatory Visit | Attending: Obstetrics and Gynecology | Admitting: Obstetrics and Gynecology

## 2019-07-07 DIAGNOSIS — G8918 Other acute postprocedural pain: Principal | ICD-10-CM | POA: Diagnosis present

## 2019-07-07 DIAGNOSIS — R1032 Left lower quadrant pain: Secondary | ICD-10-CM | POA: Diagnosis present

## 2019-07-07 DIAGNOSIS — Z20828 Contact with and (suspected) exposure to other viral communicable diseases: Secondary | ICD-10-CM | POA: Diagnosis present

## 2019-07-07 DIAGNOSIS — N939 Abnormal uterine and vaginal bleeding, unspecified: Secondary | ICD-10-CM | POA: Diagnosis not present

## 2019-07-07 DIAGNOSIS — T40605A Adverse effect of unspecified narcotics, initial encounter: Secondary | ICD-10-CM | POA: Diagnosis present

## 2019-07-07 DIAGNOSIS — Z90710 Acquired absence of both cervix and uterus: Secondary | ICD-10-CM

## 2019-07-07 DIAGNOSIS — Z5321 Procedure and treatment not carried out due to patient leaving prior to being seen by health care provider: Secondary | ICD-10-CM | POA: Diagnosis not present

## 2019-07-07 DIAGNOSIS — K5903 Drug induced constipation: Secondary | ICD-10-CM | POA: Diagnosis present

## 2019-07-07 DIAGNOSIS — R109 Unspecified abdominal pain: Secondary | ICD-10-CM | POA: Diagnosis present

## 2019-07-07 LAB — COMPREHENSIVE METABOLIC PANEL
ALT: 13 U/L (ref 0–44)
AST: 16 U/L (ref 15–41)
Albumin: 4.4 g/dL (ref 3.5–5.0)
Alkaline Phosphatase: 37 U/L — ABNORMAL LOW (ref 38–126)
Anion gap: 9 (ref 5–15)
BUN: 12 mg/dL (ref 6–20)
CO2: 24 mmol/L (ref 22–32)
Calcium: 9 mg/dL (ref 8.9–10.3)
Chloride: 103 mmol/L (ref 98–111)
Creatinine, Ser: 0.73 mg/dL (ref 0.44–1.00)
GFR calc Af Amer: >60 mL/min (ref >60)
GFR calc non Af Amer: >60 mL/min (ref >60)
Glucose, Bld: 121 mg/dL — ABNORMAL HIGH (ref 70–99)
Potassium: 3.9 mmol/L (ref 3.5–5.1)
Sodium: 136 mmol/L (ref 135–145)
Total Bilirubin: 0.4 mg/dL (ref 0.3–1.2)
Total Protein: 6.8 g/dL (ref 6.5–8.1)

## 2019-07-07 LAB — LIPASE, BLOOD: Lipase: 30 U/L (ref 11–51)

## 2019-07-07 LAB — CBC
HCT: 36.2 % (ref 36.0–46.0)
Hemoglobin: 11.4 g/dL — ABNORMAL LOW (ref 12.0–15.0)
MCH: 22.9 pg — ABNORMAL LOW (ref 26.0–34.0)
MCHC: 31.5 g/dL (ref 30.0–36.0)
MCV: 72.7 fL — ABNORMAL LOW (ref 80.0–100.0)
Platelets: 395 10*3/uL (ref 150–400)
RBC: 4.98 MIL/uL (ref 3.87–5.11)
RDW: 16 % — ABNORMAL HIGH (ref 11.5–15.5)
WBC: 7.3 10*3/uL (ref 4.0–10.5)
nRBC: 0 % (ref 0.0–0.2)

## 2019-07-07 LAB — URINALYSIS, ROUTINE W REFLEX MICROSCOPIC
Bacteria, UA: NONE SEEN
Bilirubin Urine: NEGATIVE
Glucose, UA: NEGATIVE mg/dL
Ketones, ur: NEGATIVE mg/dL
Nitrite: NEGATIVE
Protein, ur: NEGATIVE mg/dL
Specific Gravity, Urine: 1.008 (ref 1.005–1.030)
pH: 7 (ref 5.0–8.0)

## 2019-07-07 MED ORDER — OXYCODONE-ACETAMINOPHEN 5-325 MG PO TABS
1.0000 | ORAL_TABLET | ORAL | Status: DC | PRN
  Administered 2019-07-07 – 2019-07-08 (×4): 1 via ORAL
  Filled 2019-07-07 (×4): qty 1

## 2019-07-07 MED ORDER — DOCUSATE SODIUM 100 MG PO CAPS
100.0000 mg | ORAL_CAPSULE | Freq: Two times a day (BID) | ORAL | Status: DC
  Administered 2019-07-07 – 2019-07-08 (×3): 100 mg via ORAL
  Filled 2019-07-07 (×3): qty 1

## 2019-07-07 MED ORDER — METOCLOPRAMIDE HCL 5 MG/ML IJ SOLN: 10.0000 mg | Freq: Four times a day (QID) | INTRAMUSCULAR | Status: DC | PRN

## 2019-07-07 MED ORDER — PANTOPRAZOLE SODIUM 40 MG PO TBEC
40.0000 mg | DELAYED_RELEASE_TABLET | Freq: Two times a day (BID) | ORAL | Status: DC
  Administered 2019-07-07 – 2019-07-08 (×3): 40 mg via ORAL
  Filled 2019-07-07 (×3): qty 1

## 2019-07-07 MED ORDER — PREDNISONE 5 MG PO TABS
5.0000 mg | ORAL_TABLET | Freq: Every day | ORAL | Status: DC
  Administered 2019-07-08: 5 mg via ORAL
  Filled 2019-07-07: qty 1

## 2019-07-07 MED ORDER — DULOXETINE HCL 20 MG PO CPEP
20.0000 mg | ORAL_CAPSULE | Freq: Two times a day (BID) | ORAL | Status: DC
  Administered 2019-07-08 (×3): 20 mg via ORAL
  Filled 2019-07-07 (×4): qty 1

## 2019-07-07 MED ORDER — SIMETHICONE 80 MG PO CHEW: 80.0000 mg | CHEWABLE_TABLET | Freq: Four times a day (QID) | ORAL | Status: DC | PRN

## 2019-07-07 MED ORDER — TRAMADOL HCL 50 MG PO TABS
50.0000 mg | ORAL_TABLET | Freq: Four times a day (QID) | ORAL | Status: DC | PRN
  Administered 2019-07-08 (×2): 50 mg via ORAL
  Filled 2019-07-07 (×2): qty 1

## 2019-07-07 MED ORDER — OXYCODONE-ACETAMINOPHEN 5-325 MG PO TABS: 1.0000 | ORAL_TABLET | ORAL | Status: DC | PRN

## 2019-07-07 MED ORDER — PRENATAL MULTIVITAMIN CH
1.0000 | ORAL_TABLET | Freq: Every day | ORAL | Status: DC
  Administered 2019-07-08: 1 via ORAL
  Filled 2019-07-07 (×2): qty 1

## 2019-07-07 MED ORDER — IBUPROFEN 200 MG PO TABS
800.0000 mg | ORAL_TABLET | Freq: Three times a day (TID) | ORAL | Status: DC
  Administered 2019-07-08 – 2019-07-09 (×4): 800 mg via ORAL
  Filled 2019-07-07 (×4): qty 4

## 2019-07-07 MED ORDER — ONDANSETRON 4 MG PO TBDP: 4.0000 mg | ORAL_TABLET | Freq: Three times a day (TID) | ORAL | Status: DC | PRN

## 2019-07-07 MED ORDER — LEVOTHYROXINE SODIUM 75 MCG PO TABS
150.0000 ug | ORAL_TABLET | Freq: Every day | ORAL | Status: DC
  Administered 2019-07-08: 150 ug via ORAL
  Filled 2019-07-07: qty 2

## 2019-07-07 MED ORDER — SUMATRIPTAN SUCCINATE 25 MG PO TABS: 50.0000 mg | ORAL_TABLET | ORAL | Status: DC | PRN

## 2019-07-07 MED ORDER — SODIUM CHLORIDE 0.9 % IV SOLN
INTRAVENOUS | Status: DC
  Administered 2019-07-07 – 2019-07-08 (×2): via INTRAVENOUS

## 2019-07-07 NOTE — H&P (Signed)
Debbie [name] is an 38 y.o. female  S/p robotic hysterectomy and bilateral salpingectomy 2/2 menorrhagia/dysmenorrhea on 06/23/2019. Since then she has had issues with nausea and vomiting. Postop pain was improving until 2 days ago when she reported new LLQ pain, constant, dull in nature, but worse with movement/standing up. Has not had a bowel movement in 2 days, occasionally passing flatus. Previously bowel movements were soft but not diarrhea. Reports nausea on and off, episode of emesis in the ED earlier, but has been tolerating PO and some meals. Increased voiding frequency, denies dysuria. Denies SOB or cough. Reports deep breaths also makes the pain worse.      Past Medical History:  Diagnosis Date  . Anemia   . Chest pain   . Hypothyroidism   . Lupus ([place])   . Migraine   . Migraines   . Systemic lupus ([place])   . Thyroid disease   . UTI (urinary tract infection)   . Vaginal delivery 2002, 2009, 2015  . Varicose veins    Left > than right    Past Surgical History:  Procedure Laterality Date  . APPENDECTOMY    . CYSTOSCOPY N/A 06/23/2019   Procedure: CYSTOSCOPY;  Surgeon: Bobbye Charleston, MD;  Location: [facility];  Service: Gynecology;  Laterality: N/A;  . DILATION AND EVACUATION N/A 04/27/2013   Procedure: DILATATION AND EVACUATION;  Surgeon: Daria Pastures, MD;  Location: [facility];  Service: Gynecology;  Laterality: N/A;  . LAPAROSCOPIC TUBAL LIGATION Bilateral 06/08/2014   Procedure: LAPAROSCOPIC TUBAL LIGATION;  Surgeon: Daria Pastures, MD;  Location: [facility];  Service: Gynecology;  Laterality: Bilateral;  . ROBOTIC ASSISTED LAPAROSCOPIC HYSTERECTOMY AND SALPINGECTOMY Bilateral 06/23/2019   Procedure: XI ROBOTIC ASSISTED LAPAROSCOPIC HYSTERECTOMY AND SALPINGECTOMY;  Surgeon: Bobbye Charleston, MD;  Location: [facility];  Service: Gynecology;  Laterality: Bilateral;  TS RNFA confirmed on 06/08/19 CS  . TONSILLECTOMY  2001  . TONSILLECTOMY    .  TONSILLECTOMY AND ADENOIDECTOMY    . UMBILICAL HERNIA REPAIR  03/2017   x2    Family History  Problem Relation Age of Onset  . Hypertension Mother   . Hyperlipidemia Father   . Diabetes Father   . Hypertension Father   . Arthritis Neg Hx        family  . Migraines Neg Hx     Social History:  reports that she has never smoked. She has never used smokeless tobacco. She reports current alcohol use. She reports that she does not use drugs.  Allergies:  Allergies  Allergen Reactions  . Aspirin Hives    Pt states she can take Ibuprofen  . Bactrim Swelling and Rash  . Sulfa Antibiotics Swelling and Rash    Medications Prior to Admission  Medication Sig Dispense Refill Last Dose  . Belimumab (BENLYSTA IV) Inject into the vein. Every 4 weeks.     . Cholecalciferol (VITAMIN D PO) Take 1,000 Units by mouth daily.      . DULoxetine (CYMBALTA) 20 MG capsule Take 20 mg by mouth 2 (two) times daily.      Marland Kitchen ibuprofen (ADVIL) 800 MG tablet Take 1 tablet (800 mg total) by mouth every 8 (eight) hours. 30 tablet 0   . ondansetron (ZOFRAN) 4 MG tablet Take 1 tablet (4 mg total) by mouth every 8 (eight) hours as needed for nausea or vomiting. 20 tablet 0   . ondansetron (ZOFRAN-ODT) 4 MG disintegrating tablet Take 1 tablet (4 mg total) by mouth every  8 (eight) hours as needed for nausea. 30 tablet 3   . oxyCODONE-acetaminophen (PERCOCET/ROXICET) 5-325 MG tablet Take 1 tablet by mouth every 4 (four) hours as needed for moderate pain. 30 tablet 0   . pantoprazole (PROTONIX) 40 MG tablet Take 1 tablet (40 mg total) by mouth 2 (two) times daily. 180 tablet 0   . RAYOS 5 MG TBEC Take 5 mg by mouth daily. Slow Release Prednisone     . rizatriptan (MAXALT-MLT) 10 MG disintegrating tablet Take 1 tablet (10 mg total) by mouth as needed for migraine. May repeat in 2 hours if needed 9 tablet 11   . SYNTHROID 150 MCG tablet Take 1 tablet (150 mcg total) by mouth daily before breakfast. 90 tablet 1     ROS -  per HPI  Blood pressure 114/77, pulse 60, temperature 98.1 F (36.7 C), temperature source Oral, resp. rate 17, last menstrual period 05/17/2019, SpO2 100 %. Physical Exam Resting comfortably in bed Cardio: RRR Pulm: CTAB Abdomen: bowel sounds present, soft, right nontender, left side tender to palpation without guarding or rebound.   Recent Labs    07/07/19 2000  WBC 7.3  HGB 11.4*  HCT 36.2  PLT 395    Recent Labs    07/07/19 2000  NA 136  K 3.9  CL 103  BUN 12  CREATININE 0.73  GLUCOSE 121*  BILITOT 0.4  ALT 13  AST 16  ALKPHOS 37*  PROT 6.8  ALBUMIN 4.4    Recent Labs    07/07/19 2000  CALCIUM 9.0   Assessment/Plan: Debbie [name] 38 y.o. postop hysterectomy 06/23/2019 now with new LLQ pain and ongoing nausea/vomiting. Although abdominal exam reassuring, new pain with nausea concerning for bowel obstruction or diverticulitis, CT scan ordered -Home pain meds ordered: percocet, motrin, ultram. Will add IV pain meds if needed -Nausea/vomiting: ordered zofran ODT, reglan IV -UA with trace leuks, no nitrites. WBC 7.3. Hgb stable at 11.4. CMP and lipase WNL.  Continue to monitor symptoms and await results of CT scan  Debbie [name] K Taam-Akelman 07/07/2019, 11:26 PM

## 2019-07-07 NOTE — ED Triage Notes (Signed)
Pt reports had a hysterectomy on 9/23 and having left abd pains with some vaginal spotting. Was told by her doctor to come to ED.

## 2019-07-07 NOTE — Progress Notes (Signed)
Patient has arrived to the unit from being dropped off at ED.

## 2019-07-07 NOTE — ED Notes (Signed)
PT CURRENTLY TALKING ON HER PHONE.

## 2019-07-07 NOTE — ED Notes (Signed)
PT EXPRESSED HER PAIN IS INCREASING. HYSTERECTOMY 2 WEEKS AGO. OBSERVED NO VOMITING. THIS WRITER DISCUSSED PLAN OF CARE WITH PT. LABS DRAWN AND AWAITING RESULTS AND CURRENT BED ASSIGNMENTS. PT'S PLAN WAS STILL PROGRESSING EVEN THOUGH SHE WAS IN WAITING AREA. APOLOGIZED FOR HER WAIT. INFORMED HER I WOULD NOTIFY TRIAGE RN.  ENCOURAGED HER TO STAY. PT COOPERATIVE AT PRESENT.

## 2019-07-08 ENCOUNTER — Observation Stay (HOSPITAL_COMMUNITY): Payer: Medicaid Other

## 2019-07-08 ENCOUNTER — Encounter (HOSPITAL_COMMUNITY): Payer: Self-pay | Admitting: Radiology

## 2019-07-08 LAB — URINE CULTURE: Culture: <10000 — AB

## 2019-07-08 MED ORDER — HYDROCORTISONE 5 MG PO TABS: ORAL_TABLET | ORAL | 0 refills | Status: AC

## 2019-07-08 MED ORDER — IOHEXOL 300 MG/ML  SOLN
100.0000 mL | Freq: Once | INTRAMUSCULAR | Status: AC | PRN
  Administered 2019-07-08: 100 mL via INTRAVENOUS

## 2019-07-08 MED ORDER — HYDROMORPHONE HCL 1 MG/ML IJ SOLN
0.5000 mg | Freq: Once | INTRAMUSCULAR | Status: AC
  Administered 2019-07-08: 0.5 mg via INTRAVENOUS
  Filled 2019-07-08: qty 1

## 2019-07-08 MED ORDER — OXYCODONE-ACETAMINOPHEN 5-325 MG PO TABS: 1.0000 | ORAL_TABLET | ORAL | 0 refills | Status: DC | PRN

## 2019-07-08 MED ORDER — ONDANSETRON HCL 4 MG/2ML IJ SOLN
4.0000 mg | Freq: Four times a day (QID) | INTRAMUSCULAR | Status: DC | PRN
  Administered 2019-07-08: 4 mg via INTRAVENOUS
  Filled 2019-07-08: qty 2

## 2019-07-08 MED ORDER — SODIUM CHLORIDE 0.9 % IV SOLN
8.0000 mg | Freq: Three times a day (TID) | INTRAVENOUS | Status: DC | PRN
  Filled 2019-07-08: qty 4

## 2019-07-08 MED ORDER — POLYETHYLENE GLYCOL 3350 17 G PO PACK
17.0000 g | PACK | Freq: Once | ORAL | Status: AC
  Administered 2019-07-08: 17 g via ORAL
  Filled 2019-07-08: qty 1

## 2019-07-08 MED ORDER — DOCUSATE SODIUM 100 MG PO CAPS: 100.0000 mg | ORAL_CAPSULE | Freq: Two times a day (BID) | ORAL | Status: DC

## 2019-07-08 MED ORDER — HYDROCORTISONE 5 MG PO TABS
25.0000 mg | ORAL_TABLET | Freq: Three times a day (TID) | ORAL | Status: DC
  Administered 2019-07-08 (×3): 25 mg via ORAL
  Filled 2019-07-08 (×5): qty 1

## 2019-07-08 NOTE — Progress Notes (Signed)
Patient is eating, ambulating, and voiding.  Pain control is fair- still having sharp LLQ pain.  Nausea and vomiting have been better overnight.    Vitals:   07/07/19 2328 07/08/19 0026 07/08/19 0509 07/08/19 0735  BP:  113/77 109/70 103/65  Pulse:  (!) 58 60 61  Resp:  16 16 18   Temp:  97.8 F (36.6 C) 98.2 F (36.8 C) 98 F (36.7 C)  TempSrc:  Oral Oral Oral  SpO2:  100% 99% 100%  Weight: 78.8 kg       lungs:   clear to auscultation cor:    RRR Abdomen:  soft, appropriate tenderness, incisions intact and without erythema or exudate.  Incisions intact.   ex:    no cords   Vagina exam- deferred today but in office yesterday cuff was intact without exudates or bleeding, no masses felt.    CMET normal.   WBC 7.3 H/H 11/36  CT Abd/pelvis normal exam, normal bowel and hyst changes.    A/P  PO #15 Robo TLH/salpingectomies with persistent nausea and vomiting despite trying to wean percocet and now worsening LLQ pain.  Operation was uncomplicated.   Overnight pain has had only moderate control with po.  CT reviewed with radiologist- no evidence of bowel or ureteral injury.  WBC and H/H are stable- no evidence of infection or hematoma.  Pt has lupus and is in chronic meds and steroids (pred 5mg  per day).  Pt got a dose of dexa with ERAS protocol but did not get any other stress doses.  Will give course of stress dose steroids and see if pt feels better.  If not, may need hospitalist help to see if lupus is cause of current issues.    Will advance diet as tolerated today.

## 2019-07-08 NOTE — Plan of Care (Signed)

## 2019-07-08 NOTE — Progress Notes (Signed)
Patient is still having pain 8/10 even with medication given throughout night. No complaints of nausea and no vomiting. MD made aware and IV pain medication ordered. Diet advanced to clear liquids.

## 2019-07-08 NOTE — Plan of Care (Signed)
  Problem: Pain Managment: Goal: General experience of comfort will improve Outcome: Progressing   

## 2019-07-08 NOTE — Progress Notes (Signed)
Medicated with percocet and advil- pt reports not easing the pain- Dr. Philis Pique notified- states she will comee by to check on pt

## 2019-07-08 NOTE — Progress Notes (Signed)
Pt has been stable all day.  She has had continued LLQ pain but has not needed any IV and has not vomited today.    Vitals:   07/08/19 0509 07/08/19 0735 07/08/19 1313 07/08/19 1958  BP: 109/70 103/65 (!) 105/58 109/69  Pulse: 60 61 72 77  Resp: 16 18 18 16   Temp: 98.2 F (36.8 C) 98 F (36.7 C) 98.1 F (36.7 C) 98.3 F (36.8 C)  TempSrc: Oral Oral Oral Oral  SpO2: 99% 100% 100% 99%  Weight:       Will send pt home with stress dose steroids, additional percocet and have pt do miralax for bms.

## 2019-07-09 DIAGNOSIS — T40605A Adverse effect of unspecified narcotics, initial encounter: Secondary | ICD-10-CM | POA: Diagnosis present

## 2019-07-09 DIAGNOSIS — Z90710 Acquired absence of both cervix and uterus: Secondary | ICD-10-CM | POA: Diagnosis not present

## 2019-07-09 DIAGNOSIS — R1032 Left lower quadrant pain: Secondary | ICD-10-CM | POA: Diagnosis present

## 2019-07-09 DIAGNOSIS — R109 Unspecified abdominal pain: Secondary | ICD-10-CM | POA: Diagnosis present

## 2019-07-09 DIAGNOSIS — K5903 Drug induced constipation: Secondary | ICD-10-CM | POA: Diagnosis present

## 2019-07-09 DIAGNOSIS — Z20828 Contact with and (suspected) exposure to other viral communicable diseases: Secondary | ICD-10-CM | POA: Diagnosis present

## 2019-07-09 DIAGNOSIS — G8918 Other acute postprocedural pain: Secondary | ICD-10-CM | POA: Diagnosis present

## 2019-07-09 LAB — NOVEL CORONAVIRUS, NAA (HOSP ORDER, SEND-OUT TO REF LAB; TAT 18-24 HRS): SARS-CoV-2, NAA: NOT DETECTED

## 2019-07-09 NOTE — Progress Notes (Signed)
Provided discharge education/instructions, all questions and concerns addressed, Pt not in distress, discharged home with belongings. 

## 2019-07-09 NOTE — Plan of Care (Signed)

## 2019-07-09 NOTE — Progress Notes (Signed)
After planned d/c of pt, she complained of pain returning.  She had done well all day with no nausea or vomiting and pain was gone.  However, she began again with pain at what she says was about 4 pm and was not d/ced.  Vitals:   07/08/19 0509 07/08/19 0735 07/08/19 1313 07/08/19 1958  BP: 109/70 103/65 (!) 105/58 109/69  Pulse: 60 61 72 77  Resp: 16 18 18 16   Temp: 98.2 F (36.8 C) 98 F (36.7 C) 98.1 F (36.7 C) 98.3 F (36.8 C)  TempSrc: Oral Oral Oral Oral  SpO2: 99% 100% 100% 99%  Weight:       Abd soft, nontender, no rebound or guarding. EX SCDs on  A/P  Still suspect pain is coming from bowels.  Pt did receive miralax and colace today but still no bowel movement.  Will give pt an additional dose of dilaudid for sleep and d/c later today.  D/w pt need for bowel management with hydration, walking, fiber, miralax and starting to decrease the narcotics which are likely causing constipation.  If patient is still having pain after constipation is relieved, she needs to contact her lupus doctors for additional help.

## 2019-07-12 NOTE — Discharge Summary (Signed)
Physician Discharge Summary  Patient ID: Besty [name] MRN: [id number] DOB/AGE: 1981/07/20 38 y.o.  Admit date: 07/07/2019 Discharge date: 07/12/2019  Admission Diagnoses:RLQ pain and vomiting  Discharge Diagnoses:  Active Problems:   Postoperative pain   Abdominal pain   Discharged Condition: good  Hospital Course: Uncomplicated- admitted for pain control and IV fluids.  CT exam was completely negative.  Likely constipation from narcotics causing pain.  Pt  D/ced to home.  Consults: None  Significant Diagnostic Studies: radiology: CT scan: neg  Treatments: IV hydration and analgesia: Dilaudid  Discharge Exam: Blood pressure 117/75, pulse 63, temperature 98.2 F (36.8 C), temperature source Oral, resp. rate 16, weight 78.8 kg, last menstrual period 05/17/2019, SpO2 99 %.   Disposition:   Discharge Instructions    Call MD for:  temperature >100.4   Complete by: As directed    Call MD for:  temperature >100.4   Complete by: As directed    Diet - low sodium heart healthy   Complete by: As directed    Discharge instructions   Complete by: As directed    No driving on narcotics, no sexual activity for 2 weeks.   Discharge instructions   Complete by: As directed    No driving on narcotics, no sexual activity for 2 weeks.   Increase activity slowly   Complete by: As directed    Increase activity slowly   Complete by: As directed    May shower / Bathe   Complete by: As directed    Shower, no bath for 2 weeks.   May shower / Bathe   Complete by: As directed    Shower, no bath for 2 weeks.   Remove dressing in 24 hours   Complete by: As directed    Sexual Activity Restrictions   Complete by: As directed    No sexual activity for 2 weeks.   Sexual Activity Restrictions   Complete by: As directed    No sexual activity for 2 weeks.     Allergies as of 07/09/2019      Reactions   Aspirin Hives   Pt states she can take Ibuprofen   Bactrim Swelling, Rash   Sulfa  Antibiotics Swelling, Rash      Medication List    TAKE these medications   BENLYSTA IV Inject into the vein. Every 4 weeks.   DULoxetine 20 MG capsule Commonly known as: CYMBALTA Take 20 mg by mouth 2 (two) times daily.   hydrocortisone 5 MG tablet Commonly known as: CORTEF Take 5 tablets (25 mg total) by mouth 3 (three) times daily for 3 days, THEN 5 tablets (25 mg total) 2 (two) times daily for 2 days, THEN 5 tablets (25 mg total) daily for 2 days, THEN 4 tablets (20 mg total) daily for 1 day, THEN 3 tablets (15 mg total) daily for 1 day, THEN 2 tablets (10 mg total) daily for 1 day, THEN 1 tablet (5 mg total) daily for 1 day. Start taking on: July 08, 2019   ibuprofen 800 MG tablet Commonly known as: ADVIL Take 1 tablet (800 mg total) by mouth every 8 (eight) hours.   ondansetron 4 MG disintegrating tablet Commonly known as: ZOFRAN-ODT Take 1 tablet (4 mg total) by mouth every 8 (eight) hours as needed for nausea.   ondansetron 4 MG tablet Commonly known as: Zofran Take 1 tablet (4 mg total) by mouth every 8 (eight) hours as needed for nausea or vomiting.   oxyCODONE-acetaminophen 5-325 MG tablet  Commonly known as: PERCOCET/ROXICET Take 1 tablet by mouth every 4 (four) hours as needed for moderate pain.   pantoprazole 40 MG tablet Commonly known as: PROTONIX Take 1 tablet (40 mg total) by mouth 2 (two) times daily.   Rayos 5 MG Tbec Generic drug: predniSONE Take 5 mg by mouth daily. Slow Release Prednisone   rizatriptan 10 MG disintegrating tablet Commonly known as: MAXALT-MLT Take 1 tablet (10 mg total) by mouth as needed for migraine. May repeat in 2 hours if needed   Synthroid 150 MCG tablet Generic drug: levothyroxine Take 1 tablet (150 mcg total) by mouth daily before breakfast.   VITAMIN D PO Take 1,000 Units by mouth daily.      Follow-up Information    Debbie Charleston, MD Follow up in 5 day(s).   Specialty: Obstetrics and Gynecology Contact  information: [id number] [place] [address] [phone]           Signed: Daria [name] 07/12/2019, 4:54 PM

## 2019-07-16 ENCOUNTER — Ambulatory Visit: Payer: BC Managed Care – PPO | Admitting: Internal Medicine

## 2019-07-20 ENCOUNTER — Encounter: Payer: Self-pay | Admitting: Internal Medicine

## 2019-07-20 NOTE — Telephone Encounter (Signed)
FYI

## 2019-08-23 ENCOUNTER — Other Ambulatory Visit: Payer: Self-pay

## 2019-08-23 DIAGNOSIS — Z20822 Contact with and (suspected) exposure to covid-19: Secondary | ICD-10-CM

## 2019-08-24 LAB — NOVEL CORONAVIRUS, NAA: SARS-CoV-2, NAA: NOT DETECTED

## 2019-10-26 ENCOUNTER — Telehealth: Payer: Self-pay | Admitting: Neurology

## 2019-10-26 ENCOUNTER — Ambulatory Visit: Payer: Self-pay | Admitting: Family Medicine

## 2019-10-26 MED ORDER — RIZATRIPTAN BENZOATE 10 MG PO TBDP: 10.0000 mg | ORAL_TABLET | ORAL | 0 refills | Status: DC | PRN

## 2019-10-26 NOTE — Telephone Encounter (Signed)
Spoke with pt to confirm this is her nagging headache that is unresponsive to cyclobenzaprine and not a new headache or new symptoms. Advised pt she hasn't been seen in over a year, needs an appt. She was scheduled to see [facility] NP tomorrow 1/27 @ 11:00 AM. Pt denied any fever, cough, SOB, or COVID exposure. Refilled Maxalt x 1 for pt to try.

## 2019-10-26 NOTE — Telephone Encounter (Signed)
Pt called stating that she has had a headache for 2 days that she can not get rid of. She would like to discuss with RN. Please advise.

## 2019-10-27 ENCOUNTER — Other Ambulatory Visit: Payer: Self-pay

## 2019-10-27 ENCOUNTER — Ambulatory Visit: Payer: Medicaid Other | Admitting: Adult Health

## 2019-10-27 ENCOUNTER — Encounter: Payer: Self-pay | Admitting: Adult Health

## 2019-10-27 VITALS — BP 102/62 | HR 73 | Temp 97.7°F | Ht 62.0 in | Wt 182.5 lb

## 2019-10-27 DIAGNOSIS — G4489 Other headache syndrome: Secondary | ICD-10-CM

## 2019-10-27 DIAGNOSIS — G43011 Migraine without aura, intractable, with status migrainosus: Secondary | ICD-10-CM

## 2019-10-27 NOTE — Progress Notes (Addendum)
PATIENT: Debbie [name] DOB: 1981-04-08  REASON FOR VISIT: follow up HISTORY FROM: patient  HISTORY OF PRESENT ILLNESS: Today 10/27/19:  Ms. Debbie [name] is a 39 year old female with a history of migraine headaches.  She returns today for follow-up.  She reports that she had a headache started 3 days ago.  She states that this is not like her typical migraines.  She states that she is having pain that starts in the neck and radiates up the back of the head to the frontal region.  She also states that it radiates down the spine.  She reports that she has pain that radiates down the right arm.  She reports that she has a "creepy crawly" sensation on the legs.  She also notices some changes with her balance.  She has tried taking 800 mg of ibuprofen and Flexeril with no benefit.  She does have photophobia and phonophobia.  Denies any significant changes with her vision.  She rates her discomfort a 10 out of 10 today.  She returns today for an evaluation.  HISTORY Interval history 07/28/2018: She started having migraines again. The migraines are unilateral, radiates to the neck. Pulsating and throbbing. +light and sound sensitivity. Movement makes it worse. Can be severe. The headaches wake her up. Have been worsening. She was recently diagnosed with Lupus and takes injections of prednisone as needed, infusions every 4 weeks of her infusion. She has had the headache since 430am. It has been ongoing for several weeks.   Tried: Nortriptyline, Topamax and Trokendi, flexeril, zonisamide, patient stopped taking most of these without informing physician.  Interval history 05/28/2016: Has not been seen for close to a year. She was diagnosed with discoid lupus. She stopped taking the trokendi in May. She is having migraines every other day. Trokendi was working. She did not tolerate the Topiramate but Trokendi was fine and helped migraines. She has worsening migraines and dizziness and vision changes with blurry  vision. Migraines are worsening, every other day, can be severe. Headaches are in the fron of the head, or all on the right or left, lught sensitivity, nausea, vomiting. They can last the whole day. Discussed imaging again, MRI of the brain given worsening headaches, vision changes, ongoing headaches for years.  Tried: Nortriptyline, Topamax and Trokendi, flexeril, zonisamide, patient stopped taking most of these without informing physician.   Interval update 06/12/2015: Headaches are getting worse. 2-3x a day. Will start Topamax. She is worried that something is wrong. She has blurry vision, vision changes. They are severe. The Nortriptyline didn't help at all. She wants to go back on Topamax. Discussed Trokendi, maybe she will tolerate it better. It is taken at night. She had to sleep with a cool rag over her head last night due to the migraine pain. She is also having dizziness.     Interval history: She is having headaches several times a week. headaches last all day. Flexeril helped but makes her too sleepy. She stopped Topamax due to intolerance. Discussed that we can try different medication, will try Nortriptyline at night and zofran for nausea. Relpax for acute management with zofran for nausea.   HPI: Debbie [name] is a 39 y.o. female here as a referral from the hospital for Headaches. She has migraines for years since teens. She is having her migraines 3-4 times a week. She has throbbing on the right side, whole right side to the neck. She has light sensitivity, sound sensitivity, nausea. Has vomited. A dark room helps. She  gets dizziness with the headaches. No vision or hearing changes. No other focal neurologic symptoms. No aura. She has been taking OTC medications which don' t help. She is taking too many motrins. Migraines can last all day and worsening. Can get to be 10/10 pain. She has had tubal ligation. No known triggers.    REVIEW OF SYSTEMS: Out of a complete 14 system review  of symptoms, the patient complains only of the following symptoms, and all other reviewed systems are negative.  See HPI  ALLERGIES: Allergies  Allergen Reactions  . Aspirin Hives    Pt states she can take Ibuprofen  . Bactrim Swelling and Rash  . Sulfa Antibiotics Swelling and Rash    HOME MEDICATIONS: Outpatient Medications Prior to Visit  Medication Sig Dispense Refill  . Belimumab (BENLYSTA IV) Inject into the vein. Every 4 weeks.    . Cholecalciferol (VITAMIN D PO) Take 1,000 Units by mouth daily.     . DULoxetine (CYMBALTA) 20 MG capsule Take 20 mg by mouth 2 (two) times daily.     Marland Kitchen ibuprofen (ADVIL) 800 MG tablet Take 1 tablet (800 mg total) by mouth every 8 (eight) hours. 30 tablet 0  . ondansetron (ZOFRAN) 4 MG tablet Take 1 tablet (4 mg total) by mouth every 8 (eight) hours as needed for nausea or vomiting. 20 tablet 0  . ondansetron (ZOFRAN-ODT) 4 MG disintegrating tablet Take 1 tablet (4 mg total) by mouth every 8 (eight) hours as needed for nausea. 30 tablet 3  . oxyCODONE-acetaminophen (PERCOCET/ROXICET) 5-325 MG tablet Take 1 tablet by mouth every 4 (four) hours as needed for moderate pain. 30 tablet 0  . pantoprazole (PROTONIX) 40 MG tablet Take 1 tablet (40 mg total) by mouth 2 (two) times daily. 180 tablet 0  . RAYOS 5 MG TBEC Take 5 mg by mouth daily. Slow Release Prednisone    . rizatriptan (MAXALT-MLT) 10 MG disintegrating tablet Take 1 tablet (10 mg total) by mouth as needed for migraine. May repeat in 2 hours if needed 9 tablet 0  . SYNTHROID 150 MCG tablet Take 1 tablet (150 mcg total) by mouth daily before breakfast. 90 tablet 1   No facility-administered medications prior to visit.    PAST MEDICAL HISTORY: Past Medical History:  Diagnosis Date  . Anemia   . Chest pain   . Hypothyroidism   . Lupus ([place])   . Migraine   . Migraines   . Systemic lupus ([place])   . Thyroid disease   . UTI (urinary tract infection)   . Vaginal delivery 2002, 2009, 2015  .  Varicose veins    Left > than right    PAST SURGICAL HISTORY: Past Surgical History:  Procedure Laterality Date  . APPENDECTOMY    . CYSTOSCOPY N/A 06/23/2019   Procedure: CYSTOSCOPY;  Surgeon: Bobbye Charleston, MD;  Location[facility];  Service: Gynecology;  Laterality: N/A;  . DILATION AND EVACUATION N/A 04/27/2013   Procedure: DILATATION AND EVACUATION;  Surgeon: Daria Pastures, MD;  Location: [facility];  Service: Gynecology;  Laterality: N/A;  . LAPAROSCOPIC TUBAL LIGATION Bilateral 06/08/2014   Procedure: LAPAROSCOPIC TUBAL LIGATION;  Surgeon: Daria Pastures, MD;  Location: [facility];  Service: Gynecology;  Laterality: Bilateral;  . ROBOTIC ASSISTED LAPAROSCOPIC HYSTERECTOMY AND SALPINGECTOMY Bilateral 06/23/2019   Procedure: XI ROBOTIC ASSISTED LAPAROSCOPIC HYSTERECTOMY AND SALPINGECTOMY;  Surgeon: Bobbye Charleston, MD;  Location: [facility];  Service: Gynecology;  Laterality: Bilateral;  TS RNFA confirmed on  06/08/19 CS  . TONSILLECTOMY  2001  . TONSILLECTOMY    . TONSILLECTOMY AND ADENOIDECTOMY    . UMBILICAL HERNIA REPAIR  03/2017   x2    FAMILY HISTORY: Family History  Problem Relation Age of Onset  . Hypertension Mother   . Hyperlipidemia Father   . Diabetes Father   . Hypertension Father   . Arthritis Neg Hx        family  . Migraines Neg Hx     SOCIAL HISTORY: Social History   Socioeconomic History  . Marital status: Single    Spouse name: Not on file  . Number of children: 3  . Years of education: College   . Highest education level: Not on file  Occupational History  . Not on file  Tobacco Use  . Smoking status: Never Smoker  . Smokeless tobacco: Never Used  Substance and Sexual Activity  . Alcohol use: Yes    Comment: SOCIALLY  . Drug use: No  . Sexual activity: Not Currently    Birth control/protection: Surgical  Other Topics Concern  . Not on file  Social History Narrative   Lives at home with her children    Caffeine use: Drinks 1 cup coffee per day   Right handed   Social Determinants of Health   Financial Resource Strain:   . Difficulty of Paying Living Expenses: Not on file  Food Insecurity:   . Worried About Charity fundraiser in the Last Year: Not on file  . Ran Out of Food in the Last Year: Not on file  Transportation Needs:   . Lack of Transportation (Medical): Not on file  . Lack of Transportation (Non-Medical): Not on file  Physical Activity:   . Days of Exercise per Week: Not on file  . Minutes of Exercise per Session: Not on file  Stress:   . Feeling of Stress : Not on file  Social Connections:   . Frequency of Communication with Friends and Family: Not on file  . Frequency of Social Gatherings with Friends and Family: Not on file  . Attends Religious Services: Not on file  . Active Member of Clubs or Organizations: Not on file  . Attends Archivist Meetings: Not on file  . Marital Status: Not on file  Intimate Partner Violence:   . Fear of Current or Ex-Partner: Not on file  . Emotionally Abused: Not on file  . Physically Abused: Not on file  . Sexually Abused: Not on file      PHYSICAL EXAM  Vitals:   10/27/19 1126  BP: 102/62  Pulse: 73  Temp: 97.7 F (36.5 C)  Weight: 182 lb 8 oz (82.8 kg)  Height: 5\' 2"  (1.575 m)   Body mass index is 33.38 kg/m.  Generalized: Well developed, in no acute distress   Neurological examination  Mentation: Alert oriented to time, place, history taking. Follows all commands speech and language fluent Cranial nerve II-XII: Pupils were equal round reactive to light. Extraocular movements were full, visual field were full on confrontational test. Facial sensation and strength were normal. Uvula tongue midline. Head turning and shoulder shrug  were normal and symmetric. Motor: The motor testing reveals 5 over 5 strength of all 4 extremities. Good symmetric motor tone is noted throughout.  Sensory: Sensory testing is  intact to soft touch on all 4 extremities. No evidence of extinction is noted.  Coordination: Cerebellar testing reveals good finger-nose-finger and heel-to-shin bilaterally.  Gait and station: Gait  is slow and cautious.  Tandem gait is not attempted.  Romberg is negative. No drift is seen.  Reflexes: Deep tendon reflexes are symmetric and normal bilaterally.   DIAGNOSTIC DATA (LABS, IMAGING, TESTING) - I reviewed patient records, labs, notes, testing and imaging myself where available.  Lab Results  Component Value Date   WBC 7.3 07/07/2019   HGB 11.4 (L) 07/07/2019   HCT 36.2 07/07/2019   MCV 72.7 (L) 07/07/2019   PLT 395 07/07/2019      Component Value Date/Time   NA 136 07/07/2019 2000   NA 140 06/12/2015 1601   K 3.9 07/07/2019 2000   CL 103 07/07/2019 2000   CO2 24 07/07/2019 2000   GLUCOSE 121 (H) 07/07/2019 2000   BUN 12 07/07/2019 2000   BUN 14 06/12/2015 1601   CREATININE 0.73 07/07/2019 2000   CALCIUM 9.0 07/07/2019 2000   PROT 6.8 07/07/2019 2000   PROT 6.4 06/12/2015 1601   ALBUMIN 4.4 07/07/2019 2000   ALBUMIN 4.3 06/12/2015 1601   AST 16 07/07/2019 2000   ALT 13 07/07/2019 2000   ALKPHOS 37 (L) 07/07/2019 2000   BILITOT 0.4 07/07/2019 2000   BILITOT 0.6 06/12/2015 1601   GFRNONAA >60 07/07/2019 2000   GFRAA >60 07/07/2019 2000   Lab Results  Component Value Date   CHOL 166 02/11/2013   HDL 40.60 02/11/2013   LDLCALC 111 (H) 02/11/2013   TRIG 71.0 02/11/2013   CHOLHDL 4 02/11/2013    Lab Results  Component Value Date   TSH 0.579 06/12/2015      ASSESSMENT AND PLAN 39 y.o. year old female  has a past medical history of Anemia, Chest pain, Hypothyroidism, Lupus ([place]), Migraine, Migraines, Systemic lupus ([place]), Thyroid disease, UTI (urinary tract infection), Vaginal delivery (2002, 2009, 2015), and Varicose veins. here with:  1.  Migraine headache  I discussed the plan of care with Dr. Lavell Anchors.  This is not her typical migraine.  Her physical  exam is relatively unremarkable.  I advised the patient that we could do a Depakote infusion and Toradol injection today in the office.  I did advise that since his migraine is presenting differently if she did not get any relief we need to consider going to the emergency room.  Patient voiced understanding.  Addendum: Patient received Depakote 1000 mg IV and Toradol 30 mg IV.  She reports that her headache has decreased to an 8 out of 10 on the pain scale.  I advised the patient that there was nothing further I could offer in the office.  I did suggest that we need to do imaging of the brain and cervical spine.  I again explained that to get relief and answers immediately she would need to go to the emergency room.  Patient declined.  She states that she will go home and rest to see if her headache and symptoms continues to improve.  I advised that I would order an MRI of the brain and cervical spine.  I also advised the patient that if her symptoms worsened she should go to the emergency room.  Also advised that if her symptoms do not improve over the next couple of hours she should also consider going to the emergency room.  Patient voiced understanding.   I spent 15 minutes with the patient. 50% of this time was spent reviewing plan of care   Ward Givens, MSN, NP-C 10/27/2019, 11:23 AM [facility] [address] [place], [place] [address] [phone]  Made any corrections needed, and agree with history, physical, neuro exam,assessment and plan as stated.     Sarina Ill, MD [facility]

## 2019-10-28 ENCOUNTER — Encounter: Payer: Self-pay | Admitting: Adult Health

## 2019-10-28 DIAGNOSIS — G43011 Migraine without aura, intractable, with status migrainosus: Secondary | ICD-10-CM

## 2019-11-01 MED ORDER — TROKENDI XR 25 MG PO CP24: 25.0000 mg | ORAL_CAPSULE | Freq: Every day | ORAL | 5 refills | Status: DC

## 2019-11-02 ENCOUNTER — Encounter: Payer: Self-pay | Admitting: Adult Health

## 2019-11-03 NOTE — Telephone Encounter (Signed)
I called and LMVM for pt that was calling about her headaches.

## 2019-12-02 ENCOUNTER — Encounter (HOSPITAL_BASED_OUTPATIENT_CLINIC_OR_DEPARTMENT_OTHER): Payer: Self-pay | Admitting: *Deleted

## 2019-12-02 ENCOUNTER — Emergency Department (HOSPITAL_BASED_OUTPATIENT_CLINIC_OR_DEPARTMENT_OTHER)
Admission: EM | Admit: 2019-12-02 | Discharge: 2019-12-02 | Disposition: A | Discharge status: Home / Self Care | Payer: Medicaid Other | Attending: Emergency Medicine | Admitting: Emergency Medicine

## 2019-12-02 ENCOUNTER — Other Ambulatory Visit: Payer: Self-pay

## 2019-12-02 ENCOUNTER — Emergency Department (HOSPITAL_BASED_OUTPATIENT_CLINIC_OR_DEPARTMENT_OTHER): Payer: Medicaid Other

## 2019-12-02 DIAGNOSIS — E039 Hypothyroidism, unspecified: Secondary | ICD-10-CM | POA: Insufficient documentation

## 2019-12-02 DIAGNOSIS — Z20822 Contact with and (suspected) exposure to covid-19: Secondary | ICD-10-CM | POA: Diagnosis not present

## 2019-12-02 DIAGNOSIS — M791 Myalgia, unspecified site: Secondary | ICD-10-CM | POA: Diagnosis not present

## 2019-12-02 DIAGNOSIS — R112 Nausea with vomiting, unspecified: Secondary | ICD-10-CM

## 2019-12-02 DIAGNOSIS — R519 Headache, unspecified: Secondary | ICD-10-CM | POA: Diagnosis not present

## 2019-12-02 DIAGNOSIS — Z79899 Other long term (current) drug therapy: Secondary | ICD-10-CM | POA: Diagnosis not present

## 2019-12-02 DIAGNOSIS — R079 Chest pain, unspecified: Secondary | ICD-10-CM

## 2019-12-02 DIAGNOSIS — R52 Pain, unspecified: Secondary | ICD-10-CM

## 2019-12-02 DIAGNOSIS — R0789 Other chest pain: Secondary | ICD-10-CM | POA: Insufficient documentation

## 2019-12-02 LAB — URINALYSIS, ROUTINE W REFLEX MICROSCOPIC
Bilirubin Urine: NEGATIVE
Glucose, UA: NEGATIVE mg/dL
Hgb urine dipstick: NEGATIVE
Ketones, ur: NEGATIVE mg/dL
Leukocytes,Ua: NEGATIVE
Nitrite: NEGATIVE
Protein, ur: NEGATIVE mg/dL
Specific Gravity, Urine: 1.015 (ref 1.005–1.030)
pH: 6.5 (ref 5.0–8.0)

## 2019-12-02 LAB — CBC WITH DIFFERENTIAL/PLATELET
Abs Immature Granulocytes: 0.01 10*3/uL (ref 0.00–0.07)
Basophils Absolute: 0 10*3/uL (ref 0.0–0.1)
Basophils Relative: 1 %
Eosinophils Absolute: 0.2 10*3/uL (ref 0.0–0.5)
Eosinophils Relative: 3 %
HCT: 36.1 % (ref 36.0–46.0)
Hemoglobin: 12 g/dL (ref 12.0–15.0)
Immature Granulocytes: 0 %
Lymphocytes Relative: 37 %
Lymphs Abs: 2 10*3/uL (ref 0.7–4.0)
MCH: 22.9 pg — ABNORMAL LOW (ref 26.0–34.0)
MCHC: 33.2 g/dL (ref 30.0–36.0)
MCV: 68.8 fL — ABNORMAL LOW (ref 80.0–100.0)
Monocytes Absolute: 0.9 10*3/uL (ref 0.1–1.0)
Monocytes Relative: 18 %
Neutro Abs: 2.2 10*3/uL (ref 1.7–7.7)
Neutrophils Relative %: 41 %
Platelets: 284 10*3/uL (ref 150–400)
RBC: 5.25 MIL/uL — ABNORMAL HIGH (ref 3.87–5.11)
RDW: 17.2 % — ABNORMAL HIGH (ref 11.5–15.5)
WBC: 5.3 10*3/uL (ref 4.0–10.5)
nRBC: 0 % (ref 0.0–0.2)

## 2019-12-02 LAB — PREGNANCY, URINE: Preg Test, Ur: NEGATIVE

## 2019-12-02 LAB — COMPREHENSIVE METABOLIC PANEL
ALT: 15 U/L (ref 0–44)
AST: 20 U/L (ref 15–41)
Albumin: 4.2 g/dL (ref 3.5–5.0)
Alkaline Phosphatase: 34 U/L — ABNORMAL LOW (ref 38–126)
Anion gap: 6 (ref 5–15)
BUN: 14 mg/dL (ref 6–20)
CO2: 21 mmol/L — ABNORMAL LOW (ref 22–32)
Calcium: 8.7 mg/dL — ABNORMAL LOW (ref 8.9–10.3)
Chloride: 111 mmol/L (ref 98–111)
Creatinine, Ser: 0.78 mg/dL (ref 0.44–1.00)
GFR calc Af Amer: >60 mL/min (ref >60)
GFR calc non Af Amer: >60 mL/min (ref >60)
Glucose, Bld: 88 mg/dL (ref 70–99)
Potassium: 4 mmol/L (ref 3.5–5.1)
Sodium: 138 mmol/L (ref 135–145)
Total Bilirubin: 0.9 mg/dL (ref 0.3–1.2)
Total Protein: 6.6 g/dL (ref 6.5–8.1)

## 2019-12-02 LAB — TROPONIN I (HIGH SENSITIVITY): Troponin I (High Sensitivity): 2 ng/L (ref <18)

## 2019-12-02 LAB — LIPASE, BLOOD: Lipase: 33 U/L (ref 11–51)

## 2019-12-02 MED ORDER — DIPHENHYDRAMINE HCL 50 MG/ML IJ SOLN
25.0000 mg | Freq: Once | INTRAMUSCULAR | Status: AC
  Administered 2019-12-02: 25 mg via INTRAVENOUS
  Filled 2019-12-02: qty 1

## 2019-12-02 MED ORDER — PROCHLORPERAZINE EDISYLATE 10 MG/2ML IJ SOLN
10.0000 mg | Freq: Once | INTRAMUSCULAR | Status: AC
  Administered 2019-12-02: 10 mg via INTRAVENOUS
  Filled 2019-12-02: qty 2

## 2019-12-02 MED ORDER — ONDANSETRON 4 MG PO TBDP: 4.0000 mg | ORAL_TABLET | Freq: Three times a day (TID) | ORAL | 0 refills | Status: DC | PRN

## 2019-12-02 MED ORDER — SODIUM CHLORIDE 0.9 % IV BOLUS
1000.0000 mL | Freq: Once | INTRAVENOUS | Status: AC
  Administered 2019-12-02: 1000 mL via INTRAVENOUS

## 2019-12-02 MED ORDER — KETOROLAC TROMETHAMINE 15 MG/ML IJ SOLN
15.0000 mg | Freq: Once | INTRAMUSCULAR | Status: AC
  Administered 2019-12-02: 15 mg via INTRAVENOUS
  Filled 2019-12-02: qty 1

## 2019-12-02 NOTE — ED Provider Notes (Signed)
[facility] DEPARTMENT Provider Note   CSN: [id number] Arrival date & time: 12/02/19  1218     History Chief Complaint  Patient presents with  . Emesis    Debbie [name] is a 39 y.o. female.  HPI 39 year old female past medical history significant for systemic lupus, thyroid disease, migraines who presents to the emergency department today for evaluation of vomiting, body aches, headache.  Patient reports minimal cough that is not new from baseline.  She denies any focal abdominal pain.  She reports being queasy on her stomach.  She reports some generalized chest soreness.  Nonradiating.  Not pleuritic or exertional in nature.  Reports some mild sob that she states can be a side effect of the medications for lupus she takes.  Patient states that the pain seems more sore from throwing up.  Patient denies any cardiac history.  No history of PE/DVT, prolonged immobilization, recent hospitalization/surgeries, unilateral leg swelling or calf tenderness, hemoptysis, hormone use.  Patient reports history of hysterectomy.  She reports that she does get monthly of belimumab for her lupus.  She reports that her dose was increased yesterday for the first time.  Patient reports that she does get nausea and vomiting in the past with her prior infusion however has never lasted this long had never been this bad.  Patient states that she did have COVID-19 in November 2020.  Patient is taken no medications for symptoms prior to arrival.  Patient denies any cardiac history early family history of cardiac disease.    Past Medical History:  Diagnosis Date  . Anemia   . Chest pain   . Hypothyroidism   . Lupus ([place])   . Migraine   . Migraines   . Systemic lupus ([place])   . Thyroid disease   . UTI (urinary tract infection)   . Vaginal delivery 2002, 2009, 2015  . Varicose veins    Left > than right    Patient Active Problem List   Diagnosis Date Noted  . Abdominal pain 07/09/2019  .  Postoperative pain 07/07/2019  . Postoperative state 06/23/2019  . SLE (systemic lupus erythematosus) ([place]) 05/19/2019  . Obesity (BMI 30.0-34.9) 05/19/2019  . Depression, recurrent ([place]) 05/19/2019  . Hypothyroidism 05/19/2019  . Active labor 03/22/2014  . Varicose veins of leg with pain 02/16/2013  . Laryngitis, acute 04/28/2012  . Vomiting 04/28/2012  . Fever 04/28/2012  . Subconjunctival hemorrhage sm prob from vomiting 04/28/2012  . Anemia 01/27/2012  . Migraine 02/18/2011  . Allergic rhinitis 02/18/2011  . Hypothyroid 02/18/2011    Past Surgical History:  Procedure Laterality Date  . APPENDECTOMY    . CYSTOSCOPY N/A 06/23/2019   Procedure: CYSTOSCOPY;  Surgeon: Bobbye Charleston, MD;  Location: [facility];  Service: Gynecology;  Laterality: N/A;  . DILATION AND EVACUATION N/A 04/27/2013   Procedure: DILATATION AND EVACUATION;  Surgeon: Daria Pastures, MD;  Location: [facility];  Service: Gynecology;  Laterality: N/A;  . LAPAROSCOPIC TUBAL LIGATION Bilateral 06/08/2014   Procedure: LAPAROSCOPIC TUBAL LIGATION;  Surgeon: Daria Pastures, MD;  Location: [facility];  Service: Gynecology;  Laterality: Bilateral;  . ROBOTIC ASSISTED LAPAROSCOPIC HYSTERECTOMY AND SALPINGECTOMY Bilateral 06/23/2019   Procedure: XI ROBOTIC ASSISTED LAPAROSCOPIC HYSTERECTOMY AND SALPINGECTOMY;  Surgeon: Bobbye Charleston, MD;  Location: [facility];  Service: Gynecology;  Laterality: Bilateral;  TS RNFA confirmed on 06/08/19 CS  . TONSILLECTOMY  2001  . TONSILLECTOMY    . TONSILLECTOMY AND ADENOIDECTOMY    .  UMBILICAL HERNIA REPAIR  03/2017   x2     OB History    Gravida  6   Para  3   Term  3   Preterm      AB  3   Living  3     SAB  2   TAB  1   Ectopic      Multiple      Live Births  3           Family History  Problem Relation Age of Onset  . Hypertension Mother   . Hyperlipidemia Father   . Diabetes Father   . Hypertension Father   .  Arthritis Neg Hx        family  . Migraines Neg Hx     Social History   Tobacco Use  . Smoking status: Never Smoker  . Smokeless tobacco: Never Used  Substance Use Topics  . Alcohol use: Yes    Comment: SOCIALLY  . Drug use: No    Home Medications Prior to Admission medications   Medication Sig Start Date End Date Taking? Authorizing Provider  Belimumab (BENLYSTA IV) Inject into the vein. Every 4 weeks.    [provider]  Cholecalciferol (VITAMIN D PO) Take 1,000 Units by mouth daily.     [provider]  ibuprofen (ADVIL) 800 MG tablet Take 1 tablet (800 mg total) by mouth every 8 (eight) hours. 06/24/19   Bobbye Charleston, MD  ondansetron (ZOFRAN ODT) 4 MG disintegrating tablet Take 1 tablet (4 mg total) by mouth every 8 (eight) hours as needed for nausea or vomiting. 12/02/19   Doristine Devoid, PA-C  pantoprazole (PROTONIX) 40 MG tablet Take 1 tablet (40 mg total) by mouth 2 (two) times daily. 05/26/19   Isaac Bliss, Rayford Halsted, MD  rizatriptan (MAXALT-MLT) 10 MG disintegrating tablet Take 1 tablet (10 mg total) by mouth as needed for migraine. May repeat in 2 hours if needed Patient not taking: Reported on 10/27/2019 10/26/19   Melvenia Beam, MD  SYNTHROID 150 MCG tablet Take 1 tablet (150 mcg total) by mouth daily before breakfast. 06/08/19   Isaac Bliss, Rayford Halsted, MD  Topiramate ER (TROKENDI XR) 25 MG CP24 Take 1 capsule (25 mg total) by mouth at bedtime. 11/01/19   Ward Givens, NP    Allergies    Aspirin, Bactrim, and Sulfa antibiotics  Review of Systems   Review of Systems  Constitutional: Negative for chills and fever.  HENT: Negative for congestion.   Eyes: Negative for discharge.  Respiratory: Positive for cough and shortness of breath.   Cardiovascular: Positive for chest pain.  Gastrointestinal: Positive for nausea and vomiting. Negative for abdominal pain, blood in stool, constipation and diarrhea.  Genitourinary: Negative for  dysuria, frequency, hematuria and urgency.  Musculoskeletal: Positive for arthralgias and myalgias.  Skin: Negative for color change.  Neurological: Positive for headaches.  Psychiatric/Behavioral: Negative for confusion.    Physical Exam Updated Vital Signs BP (!) 93/52   Pulse 78   Temp 98.3 F (36.8 C) (Oral)   Resp 17   Ht 5\' 2"  (1.575 m)   Wt 80.7 kg   LMP 05/17/2019 (Approximate)   SpO2 100%   BMI 32.56 kg/m   Physical Exam Vitals and nursing note reviewed.  Constitutional:      General: She is not in acute distress.    Appearance: She is well-developed. She is not ill-appearing or toxic-appearing.  HENT:     Head:  Normocephalic and atraumatic.     Nose: Nose normal.     Mouth/Throat:     Mouth: Mucous membranes are moist.     Pharynx: Oropharynx is clear.  Eyes:     General:        Right eye: No discharge.        Left eye: No discharge.     Conjunctiva/sclera: Conjunctivae normal.     Pupils: Pupils are equal, round, and reactive to light.  Cardiovascular:     Rate and Rhythm: Normal rate and regular rhythm.     Pulses: Normal pulses.     Heart sounds: Normal heart sounds. No murmur. No friction rub. No gallop.   Pulmonary:     Effort: Pulmonary effort is normal. No respiratory distress.     Breath sounds: Normal breath sounds. No stridor. No wheezing, rhonchi or rales.  Chest:     Chest wall: Tenderness present.  Abdominal:     General: Abdomen is flat. Bowel sounds are normal.     Palpations: Abdomen is soft.     Tenderness: There is no abdominal tenderness. There is no right CVA tenderness, left CVA tenderness, guarding or rebound.  Musculoskeletal:        General: No tenderness. Normal range of motion.     Cervical back: Normal range of motion and neck supple. No rigidity.  Lymphadenopathy:     Cervical: No cervical adenopathy.  Skin:    General: Skin is warm and dry.     Capillary Refill: Capillary refill takes less than 2 seconds.  Neurological:      Mental Status: She is alert and oriented to person, place, and time.     Comments: Ambulates with normal gait.  Moves all extremities without any difficulties.  Speech is clear.  Psychiatric:        Behavior: Behavior normal.        Thought Content: Thought content normal.        Judgment: Judgment normal.     ED Results / Procedures / Treatments   Labs (all labs ordered are listed, but only abnormal results are displayed) Labs Reviewed  URINALYSIS, ROUTINE W REFLEX MICROSCOPIC - Abnormal; Notable for the following components:      Result Value   APPearance HAZY (*)    All other components within normal limits  COMPREHENSIVE METABOLIC PANEL - Abnormal; Notable for the following components:   CO2 21 (*)    Calcium 8.7 (*)    Alkaline Phosphatase 34 (*)    All other components within normal limits  CBC WITH DIFFERENTIAL/PLATELET - Abnormal; Notable for the following components:   RBC 5.25 (*)    MCV 68.8 (*)    MCH 22.9 (*)    RDW 17.2 (*)    All other components within normal limits  SARS CORONAVIRUS 2 (TAT 6-24 HRS)  PREGNANCY, URINE  LIPASE, BLOOD  TROPONIN I (HIGH SENSITIVITY)  TROPONIN I (HIGH SENSITIVITY)    EKG None  Radiology DG Chest Portable 1 View  Result Date: 12/02/2019 CLINICAL DATA:  Cough and chest pain. Nausea. EXAM: PORTABLE CHEST 1 VIEW COMPARISON:  Chest radiograph 04/06/2018 FINDINGS: Minor streaky left lung base atelectasis.The cardiomediastinal contours are normal. The lungs are clear. Pulmonary vasculature is normal. No confluent airspace disease, pleural effusion, or pneumothorax. No acute osseous abnormalities are seen. IMPRESSION: Minor left lung base atelectasis. Electronically Signed   By: Keith Rake M.D.   On: 12/02/2019 14:05    Procedures Procedures (including critical care  time)  Medications Ordered in ED Medications  prochlorperazine (COMPAZINE) injection 10 mg (10 mg Intravenous Given 12/02/19 1341)  diphenhydrAMINE (BENADRYL)  injection 25 mg (25 mg Intravenous Given 12/02/19 1339)  ketorolac (TORADOL) 15 MG/ML injection 15 mg (15 mg Intravenous Given 12/02/19 1343)  sodium chloride 0.9 % bolus 1,000 mL (0 mLs Intravenous Stopped 12/02/19 1533)    ED Course  I have reviewed the triage vital signs and the nursing notes.  Pertinent labs & imaging results that were available during my care of the patient were reviewed by me and considered in my medical decision making (see chart for details).    MDM Rules/Calculators/A&P                      39 year old female history of systemic lupus presents the emergency department today for evaluation of nausea, vomiting, body aches, headache in the setting of receiving her increased dose of her lupus medication yesterday.  Patient denies any focal abdominal pain.  She reports some generalized chest pain or shortness of breath that she believes may be from throwing up.  Patient has no known sick contacts.  Prior COVID-19 infection.  On exam no focal abdominal pain to palpation.  Chest is mildly tender to palpation.  Bowel sounds are present.  Heart regular rate and rhythm.  Lungs clear to auscultation bilaterally.  Ambulates with normal gait.  Moves all extremities any difficulties.  No nuchal rigidity.  Patient's labs are reassuring.  No leukocytosis.  Normal hemoglobin.  No significant electrolyte derangement.  Calcium is mildly decreased at 8.7.  Bicarb is 21.  This is likely the setting of dehydration.  Normal glucose and anion gap.  Patient's lipase is normal.  UA showed no signs of infection.  Given patient's chest pain troponin was ordered which was normal.  EKG obtained shows sinus rhythm with nonspecific T wave change without any acute signs of ST elevation depression with a heart rate of 80 bpm with normal PR QRS and QT interval.  Chest x-ray reviewed myself that shows no evidence of acute cardiac or pulmonary abnormality.  Does note some atelectasis.  Doubt pneumonia given reassuring  lung signs and patient is afebrile.  Feel the patient's vomiting is likely secondary to recent increase in her infusion she received yesterday.  Patient given migraine cocktail and antiemetics in the ER today.  Complete resolution in symptoms.  Tolerated general diet difficulties.  COVID-19 test is pending.  Discussed quarantine precautions at home with patient.  Given the patient has no focal abdominal pain to palpation do not feel that imaging would be indicated at this time.  Patient's chest pain does not seem consistent with ACS, PE or dissection.  Doubt myocarditis or pericarditis.  Patient to follow-up with primary care doctor or rheumatologist.  Discussed reasons to return to the ER.  Pt is hemodynamically stable, in NAD, & able to ambulate in the ED. Evaluation does not show pathology that would require ongoing emergent intervention or inpatient treatment. I explained the diagnosis to the patient. Pain has been managed & has no complaints prior to dc. Pt is comfortable with above plan and is stable for discharge at this time. All questions were answered prior to disposition. Strict return precautions for f/u to the ED were discussed. Encouraged follow up with PCP.  Final Clinical Impression(s) / ED Diagnoses Final diagnoses:  Nausea and vomiting, intractability of vomiting not specified, unspecified vomiting type  Nonintractable headache, unspecified chronicity pattern, unspecified headache type  Generalized body aches  Chest pain, unspecified type    Rx / DC Orders ED Discharge Orders         Ordered    ondansetron (ZOFRAN ODT) 4 MG disintegrating tablet  Every 8 hours PRN     12/02/19 1524           Doristine Devoid, PA-C 12/02/19 1657    Little, Wenda Overland, MD 12/03/19 1341

## 2019-12-02 NOTE — Discharge Instructions (Addendum)
Your work-up has been reassuring in the emergency department today.  I suspect that your symptoms may be from your infusion yesterday.  We will discuss with your rheumatologist before having the effusion again.  We will give you a short course of Zofran for nausea at home.  Motrin and Tylenol for body aches.  Drink plenty of fluids stay hydrated.  Return to the ER with any worsening symptoms.  You do have a COVID-19 test that is pending.  Please quarantine until this test is negative.  Positive will need to continue to quarantine for 10 days since onset of symptoms which was yesterday.

## 2019-12-02 NOTE — ED Triage Notes (Signed)
Vomiting since Lupus medication infusion yesterday.

## 2019-12-02 NOTE — ED Notes (Signed)
States yesterday rec infusion for her Lupus, states dose had been increased this past infusion, states since infusion has been having nausea and vomiting and some abdominal pain. Unable to tolerate PO fluids.

## 2019-12-03 LAB — SARS CORONAVIRUS 2 (TAT 6-24 HRS): SARS Coronavirus 2: NEGATIVE

## 2019-12-08 ENCOUNTER — Encounter: Payer: Self-pay | Admitting: Adult Health

## 2019-12-09 NOTE — Telephone Encounter (Signed)
I called patient and LVM to discuss her symptoms

## 2020-01-07 ENCOUNTER — Ambulatory Visit: Payer: Medicaid Other | Attending: Internal Medicine

## 2020-01-07 DIAGNOSIS — Z23 Encounter for immunization: Secondary | ICD-10-CM

## 2020-01-07 NOTE — Progress Notes (Signed)
   Covid-19 Vaccination Clinic  Name:  Debbie [name]    MRN: [id number] DOB: 15-Feb-1981  01/07/2020  Ms. Debbie [name] was observed post Covid-19 immunization for 15 minutes without incident. She was provided with Vaccine Information Sheet and instruction to access the V-Safe system.   Ms. Debbie [name] was instructed to call 911 with any severe reactions post vaccine: Marland Kitchen Difficulty breathing  . Swelling of face and throat  . A fast heartbeat  . A bad rash all over body  . Dizziness and weakness   Immunizations Administered    Name Date Dose VIS Date Route   Pfizer COVID-19 Vaccine 01/07/2020  1:47 PM 0.3 mL 09/10/2019 Intramuscular   Manufacturer: [place]   Lot: [id number]   [place]: [id number]

## 2020-01-12 ENCOUNTER — Other Ambulatory Visit: Payer: Self-pay | Admitting: Internal Medicine

## 2020-01-12 DIAGNOSIS — E039 Hypothyroidism, unspecified: Secondary | ICD-10-CM

## 2020-01-28 ENCOUNTER — Encounter: Payer: Self-pay | Admitting: Adult Health

## 2020-02-01 ENCOUNTER — Encounter: Payer: Self-pay | Admitting: Family Medicine

## 2020-02-01 ENCOUNTER — Ambulatory Visit: Payer: Medicaid Other | Admitting: Family Medicine

## 2020-02-01 ENCOUNTER — Other Ambulatory Visit: Payer: Self-pay

## 2020-02-01 VITALS — BP 106/71 | HR 84 | Temp 97.4°F | Ht 62.0 in | Wt 180.0 lb

## 2020-02-01 DIAGNOSIS — G43019 Migraine without aura, intractable, without status migrainosus: Secondary | ICD-10-CM

## 2020-02-01 MED ORDER — ONDANSETRON 4 MG PO TBDP: 4.0000 mg | ORAL_TABLET | Freq: Three times a day (TID) | ORAL | 0 refills | Status: DC | PRN

## 2020-02-01 MED ORDER — KETOROLAC TROMETHAMINE 60 MG/2ML IM SOLN
30.0000 mg | Freq: Once | INTRAMUSCULAR | Status: AC
  Administered 2020-02-01: 30 mg via INTRAMUSCULAR

## 2020-02-01 MED ORDER — TROKENDI XR 25 MG PO CP24: 25.0000 mg | ORAL_CAPSULE | Freq: Every day | ORAL | 5 refills | Status: AC

## 2020-02-01 MED ORDER — NURTEC 75 MG PO TBDP: 75.0000 mg | ORAL_TABLET | Freq: Every day | ORAL | 11 refills | Status: DC | PRN

## 2020-02-01 MED ORDER — KETOROLAC TROMETHAMINE 30 MG/ML IJ SOLN: 30.0000 mg | Freq: Once | INTRAMUSCULAR | Status: DC

## 2020-02-01 NOTE — Progress Notes (Signed)
Pt here for revisit for migraines with Debbora Presto, NP.  Toradol 30mg  IM given under aseptic technique to L deltoid.  .  Tolerated well.  Bandaid applied.

## 2020-02-01 NOTE — Addendum Note (Signed)
Addended by: Brandon Melnick on: 02/01/2020 02:14 PM   Modules accepted: Orders

## 2020-02-01 NOTE — Patient Instructions (Addendum)
We will increase Trokendi to 50mg  at bedtime. We will start Nurtec for abortive therapy. Take 1 tablet at onset of headaches. Avoid regular use. Avoid regular use of OTC analgesics. Take Zofran as needed for nausea. Continue close follow up with rheumatology and PCP for management of Lupus, allergies and anxiety.   Follow up in 6 months, sooner if needed    Migraine Headache A migraine headache is a very strong throbbing pain on one side or both sides of your head. This type of headache can also cause other symptoms. It can last from 4 hours to 3 days. Talk with your doctor about what things may bring on (trigger) this condition. What are the causes? The exact cause of this condition is not known. This condition may be triggered or caused by:  Drinking alcohol.  Smoking.  Taking medicines, such as: ? Medicine used to treat chest pain (nitroglycerin). ? Birth control pills. ? Estrogen. ? Some blood pressure medicines.  Eating or drinking certain products.  Doing physical activity. Other things that may trigger a migraine headache include:  Having a menstrual period.  Pregnancy.  Hunger.  Stress.  Not getting enough sleep or getting too much sleep.  Weather changes.  Tiredness (fatigue). What increases the risk?  Being 49-57 years old.  Being female.  Having a family history of migraine headaches.  Being Caucasian.  Having depression or anxiety.  Being very overweight. What are the signs or symptoms?  A throbbing pain. This pain may: ? Happen in any area of the head, such as on one side or both sides. ? Make it hard to do daily activities. ? Get worse with physical activity. ? Get worse around bright lights or loud noises.  Other symptoms may include: ? Feeling sick to your stomach (nauseous). ? Vomiting. ? Dizziness. ? Being sensitive to bright lights, loud noises, or smells.  Before you get a migraine headache, you may get warning signs (an aura).  An aura may include: ? Seeing flashing lights or having blind spots. ? Seeing bright spots, halos, or zigzag lines. ? Having tunnel vision or blurred vision. ? Having numbness or a tingling feeling. ? Having trouble talking. ? Having weak muscles.  Some people have symptoms after a migraine headache (postdromal phase), such as: ? Tiredness. ? Trouble thinking (concentrating). How is this treated?  Taking medicines that: ? Relieve pain. ? Relieve the feeling of being sick to your stomach. ? Prevent migraine headaches.  Treatment may also include: ? Having acupuncture. ? Avoiding foods that bring on migraine headaches. ? Learning ways to control your body functions (biofeedback). ? Therapy to help you know and deal with negative thoughts (cognitive behavioral therapy). Follow these instructions at home: Medicines  Take over-the-counter and prescription medicines only as told by your doctor.  Ask your doctor if the medicine prescribed to you: ? Requires you to avoid driving or using heavy machinery. ? Can cause trouble pooping (constipation). You may need to take these steps to prevent or treat trouble pooping:  Drink enough fluid to keep your pee (urine) pale yellow.  Take over-the-counter or prescription medicines.  Eat foods that are high in fiber. These include beans, whole grains, and fresh fruits and vegetables.  Limit foods that are high in fat and sugar. These include fried or sweet foods. Lifestyle  Do not drink alcohol.  Do not use any products that contain nicotine or tobacco, such as cigarettes, e-cigarettes, and chewing tobacco. If you need help quitting, ask  your doctor.  Get at least 8 hours of sleep every night.  Limit and deal with stress. General instructions      Keep a journal to find out what may bring on your migraine headaches. For example, write down: ? What you eat and drink. ? How much sleep you get. ? Any change in what you eat or  drink. ? Any change in your medicines.  If you have a migraine headache: ? Avoid things that make your symptoms worse, such as bright lights. ? It may help to lie down in a dark, quiet room. ? Do not drive or use heavy machinery. ? Ask your doctor what activities are safe for you.  Keep all follow-up visits as told by your doctor. This is important. Contact a doctor if:  You get a migraine headache that is different or worse than others you have had.  You have more than 15 headache days in one month. Get help right away if:  Your migraine headache gets very bad.  Your migraine headache lasts longer than 72 hours.  You have a fever.  You have a stiff neck.  You have trouble seeing.  Your muscles feel weak or like you cannot control them.  You start to lose your balance a lot.  You start to have trouble walking.  You pass out (faint).  You have a seizure. Summary  A migraine headache is a very strong throbbing pain on one side or both sides of your head. These headaches can also cause other symptoms.  This condition may be treated with medicines and changes to your lifestyle.  Keep a journal to find out what may bring on your migraine headaches.  Contact a doctor if you get a migraine headache that is different or worse than others you have had.  Contact your doctor if you have more than 15 headache days in a month. This information is not intended to replace advice given to you by your health care provider. Make sure you discuss any questions you have with your health care provider. Document Revised: 01/08/2019 Document Reviewed: 10/29/2018 Elsevier Patient Education  [place].

## 2020-02-01 NOTE — Progress Notes (Addendum)
PATIENT: Debbie [name] DOB: 11-Apr-1981  REASON FOR VISIT: follow up HISTORY FROM: patient  Chief Complaint  Patient presents with  . Follow-up    alone, rm 9  . Migraine     HISTORY OF PRESENT ILLNESS: Today 02/01/20 Debbie [name] is a 39 y.o. female here today for follow up for migraines. She continues Trokendi 25mg  at bedtime. She uses rizatriptan for abortive therapy but does not feel that this helps. She has at least 3 headache days each week. 1-2 migrainous symptoms per week.  She is treated for Lupus with IV Benysta and feels this may be contributing. She also has allergy symptoms that are contributing. She feels that stress could be contributing. She is asking for acute management today as she has a migraine in the office. She has taken ketorolac in the past with no reactions. Allergy reported with aspirin but has taken and tolerated ibuprofen and ketorolac. No vision changes.    HISTORY: (copied from [place] note on 10/27/2019)  Debbie [name] is a 39 year old female with a history of migraine headaches.  She returns today for follow-up.  She reports that she had a headache started 3 days ago.  She states that this is not like her typical migraines.  She states that she is having pain that starts in the neck and radiates up the back of the head to the frontal region.  She also states that it radiates down the spine.  She reports that she has pain that radiates down the right arm.  She reports that she has a "creepy crawly" sensation on the legs.  She also notices some changes with her balance.  She has tried taking 800 mg of ibuprofen and Flexeril with no benefit.  She does have photophobia and phonophobia.  Denies any significant changes with her vision.  She rates her discomfort a 10 out of 10 today.  She returns today for an evaluation.  HISTORY Interval history 07/28/2018: She started having migraines again. The migraines are unilateral, radiates to the neck. Pulsating and  throbbing. +light and sound sensitivity. Movement makes it worse. Can be severe. The headaches wake her up. Have been worsening. She was recently diagnosed with Lupus and takes injections of prednisone as needed, infusions every 4 weeks of her infusion. She has had the headache since 430am. It has been ongoing for several weeks.  Tried: Nortriptyline, Topamax and Trokendi, flexeril, zonisamide, patient stopped taking most of these without informing physician.  Interval history 05/28/2016: Has not been seen for close to a year. She was diagnosed with discoid lupus. She stopped taking the trokendi in May. She is having migraines every other day. Trokendi was working. She did not tolerate the Topiramate but Trokendi was fine and helped migraines. She has worsening migraines and dizziness and vision changes with blurry vision. Migraines are worsening, every other day, can be severe. Headaches are in the fron of the head, or all on the right or left, lught sensitivity, nausea, vomiting. They can last the whole day. Discussed imaging again, MRI of the brain given worsening headaches, vision changes, ongoing headaches for years.  Tried: Nortriptyline, Topamax and Trokendi, flexeril, zonisamide, patient stopped taking most of these without informing physician.   Interval update 06/12/2015: Headaches are getting worse. 2-3x a day. Will start Topamax. She is worried that something is wrong. She has blurry vision, vision changes. They are severe. The Nortriptyline didn't help at all. She wants to go back on Topamax. Discussed Trokendi, maybe she  will tolerate it better. It is taken at night. She had to sleep with a cool rag over her head last night due to the migraine pain. She is also having dizziness.   Interval history: She is having headaches several times a week. headaches last all day. Flexeril helped but makes her too sleepy. She stopped Topamax due to intolerance. Discussed that we can try different  medication, will try Nortriptyline at night and zofran for nausea. Relpax for acute management with zofran for nausea.   HPI: Debbie [name] is a 39 y.o. female here as a referral from the hospital for Headaches. She has migraines for years since teens. She is having her migraines 3-4 times a week. She has throbbing on the right side, whole right side to the neck. She has light sensitivity, sound sensitivity, nausea. Has vomited. A dark room helps. She gets dizziness with the headaches. No vision or hearing changes. No other focal neurologic symptoms. No aura. She has been taking OTC medications which don' t help. She is taking too many motrins. Migraines can last all day and worsening. Can get to be 10/10 pain. She has had tubal ligation. No known triggers.     REVIEW OF SYSTEMS: Out of a complete 14 system review of symptoms, the patient complains only of the following symptoms, migraines, pain, anxiety and all other reviewed systems are negative.  ALLERGIES: Allergies  Allergen Reactions  . Aspirin Hives    Pt states she can take Ibuprofen  . Bactrim Swelling and Rash  . Sulfa Antibiotics Swelling and Rash    HOME MEDICATIONS: Outpatient Medications Prior to Visit  Medication Sig Dispense Refill  . Belimumab (BENLYSTA IV) Inject into the vein. Every 4 weeks.    . Cholecalciferol (VITAMIN D PO) Take 1,000 Units by mouth daily.     Marland Kitchen ibuprofen (ADVIL) 800 MG tablet Take 1 tablet (800 mg total) by mouth every 8 (eight) hours. 30 tablet 0  . SYNTHROID 150 MCG tablet TAKE 1 TABLET(150 MCG) BY MOUTH DAILY BEFORE BREAKFAST 30 tablet 0  . Topiramate ER (TROKENDI XR) 25 MG CP24 Take 1 capsule (25 mg total) by mouth at bedtime. 30 capsule 5  . ondansetron (ZOFRAN ODT) 4 MG disintegrating tablet Take 1 tablet (4 mg total) by mouth every 8 (eight) hours as needed for nausea or vomiting. 8 tablet 0  . pantoprazole (PROTONIX) 40 MG tablet Take 1 tablet (40 mg total) by mouth 2 (two) times daily. 180  tablet 0  . rizatriptan (MAXALT-MLT) 10 MG disintegrating tablet Take 1 tablet (10 mg total) by mouth as needed for migraine. May repeat in 2 hours if needed (Patient not taking: Reported on 10/27/2019) 9 tablet 0   No facility-administered medications prior to visit.    PAST MEDICAL HISTORY: Past Medical History:  Diagnosis Date  . Anemia   . Chest pain   . Hypothyroidism   . Lupus ([place])   . Migraine   . Migraines   . Systemic lupus ([place])   . Thyroid disease   . UTI (urinary tract infection)   . Vaginal delivery 2002, 2009, 2015  . Varicose veins    Left > than right    PAST SURGICAL HISTORY: Past Surgical History:  Procedure Laterality Date  . APPENDECTOMY    . CYSTOSCOPY N/A 06/23/2019   Procedure: CYSTOSCOPY;  Surgeon: Bobbye Charleston, MD;  Location: [facility];  Service: Gynecology;  Laterality: N/A;  . DILATION AND EVACUATION N/A 04/27/2013   Procedure: DILATATION  AND EVACUATION;  Surgeon: Daria Pastures, MD;  Location: [facility];  Service: Gynecology;  Laterality: N/A;  . LAPAROSCOPIC TUBAL LIGATION Bilateral 06/08/2014   Procedure: LAPAROSCOPIC TUBAL LIGATION;  Surgeon: Daria Pastures, MD;  Location: [facility];  Service: Gynecology;  Laterality: Bilateral;  . ROBOTIC ASSISTED LAPAROSCOPIC HYSTERECTOMY AND SALPINGECTOMY Bilateral 06/23/2019   Procedure: XI ROBOTIC ASSISTED LAPAROSCOPIC HYSTERECTOMY AND SALPINGECTOMY;  Surgeon: Bobbye Charleston, MD;  Location: [facility];  Service: Gynecology;  Laterality: Bilateral;  TS RNFA confirmed on 06/08/19 CS  . TONSILLECTOMY  2001  . TONSILLECTOMY    . TONSILLECTOMY AND ADENOIDECTOMY    . UMBILICAL HERNIA REPAIR  03/2017   x2    FAMILY HISTORY: Family History  Problem Relation Age of Onset  . Hypertension Mother   . Hyperlipidemia Father   . Diabetes Father   . Hypertension Father   . Arthritis Neg Hx        family  . Migraines Neg Hx     SOCIAL HISTORY: Social History    Socioeconomic History  . Marital status: Single    Spouse name: Not on file  . Number of children: 3  . Years of education: College   . Highest education level: Not on file  Occupational History  . Not on file  Tobacco Use  . Smoking status: Never Smoker  . Smokeless tobacco: Never Used  Substance and Sexual Activity  . Alcohol use: Yes    Comment: SOCIALLY  . Drug use: No  . Sexual activity: Not Currently    Birth control/protection: Surgical  Other Topics Concern  . Not on file  Social History Narrative   Lives at home with her children   Caffeine use: Drinks 1 cup coffee per day   Right handed   Social Determinants of Health   Financial Resource Strain:   . Difficulty of Paying Living Expenses:   Food Insecurity:   . Worried About Charity fundraiser in the Last Year:   . Arboriculturist in the Last Year:   Transportation Needs:   . Film/video editor (Medical):   Marland Kitchen Lack of Transportation (Non-Medical):   Physical Activity:   . Days of Exercise per Week:   . Minutes of Exercise per Session:   Stress:   . Feeling of Stress :   Social Connections:   . Frequency of Communication with Friends and Family:   . Frequency of Social Gatherings with Friends and Family:   . Attends Religious Services:   . Active Member of Clubs or Organizations:   . Attends Archivist Meetings:   Marland Kitchen Marital Status:   Intimate Partner Violence:   . Fear of Current or Ex-Partner:   . Emotionally Abused:   Marland Kitchen Physically Abused:   . Sexually Abused:       PHYSICAL EXAM  Vitals:   02/01/20 1309  BP: 106/71  Pulse: 84  Temp: (!) 97.4 F (36.3 C)  Weight: 180 lb (81.6 kg)  Height: 5\' 2"  (1.575 m)   Body mass index is 32.92 kg/m.  Generalized: Well developed, in no acute distress  Cardiology: normal rate and rhythm, no murmur noted Respiratory: clear to auscultation bilaterally  Neurological examination  Mentation: Alert oriented to time, place, history taking.  Follows all commands speech and language fluent Cranial nerve II-XII: Pupils were equal round reactive to light. Extraocular movements were full, visual field were full on confrontational test. Facial sensation and strength were normal. Uvula tongue  midline. Head turning and shoulder shrug  were normal and symmetric. Motor: The motor testing reveals 5 over 5 strength of all 4 extremities. Good symmetric motor tone is noted throughout.  Sensory: Sensory testing is intact to soft touch on all 4 extremities. No evidence of extinction is noted.  Coordination: Cerebellar testing reveals good finger-nose-finger and heel-to-shin bilaterally.  Gait and station: Gait is normal.   DIAGNOSTIC DATA (LABS, IMAGING, TESTING) - I reviewed patient records, labs, notes, testing and imaging myself where available.  No flowsheet data found.   Lab Results  Component Value Date   WBC 5.3 12/02/2019   HGB 12.0 12/02/2019   HCT 36.1 12/02/2019   MCV 68.8 (L) 12/02/2019   PLT 284 12/02/2019      Component Value Date/Time   NA 138 12/02/2019 1249   NA 140 06/12/2015 1601   K 4.0 12/02/2019 1249   CL 111 12/02/2019 1249   CO2 21 (L) 12/02/2019 1249   GLUCOSE 88 12/02/2019 1249   BUN 14 12/02/2019 1249   BUN 14 06/12/2015 1601   CREATININE 0.78 12/02/2019 1249   CALCIUM 8.7 (L) 12/02/2019 1249   PROT 6.6 12/02/2019 1249   PROT 6.4 06/12/2015 1601   ALBUMIN 4.2 12/02/2019 1249   ALBUMIN 4.3 06/12/2015 1601   AST 20 12/02/2019 1249   ALT 15 12/02/2019 1249   ALKPHOS 34 (L) 12/02/2019 1249   BILITOT 0.9 12/02/2019 1249   BILITOT 0.6 06/12/2015 1601   GFRNONAA >60 12/02/2019 1249   GFRAA >60 12/02/2019 1249   Lab Results  Component Value Date   CHOL 166 02/11/2013   HDL 40.60 02/11/2013   LDLCALC 111 (H) 02/11/2013   TRIG 71.0 02/11/2013   CHOLHDL 4 02/11/2013   No results found for: HGBA1C No results found for: VITAMINB12 Lab Results  Component Value Date   TSH 0.579 06/12/2015        ASSESSMENT AND PLAN 39 y.o. year old female  has a past medical history of Anemia, Chest pain, Hypothyroidism, Lupus ([place]), Migraine, Migraines, Systemic lupus ([place]), Thyroid disease, UTI (urinary tract infection), Vaginal delivery (2002, 2009, 2015), and Varicose veins. here with     ICD-10-CM   1. Intractable migraine without aura and without status migrainosus  G43.019 ketorolac (TORADOL) 30 MG/ML injection 30 mg    Debbie [name] reports that headaches have worsened over the past few months.  She is tolerating Trokendi well with no obvious adverse effects.  We will increase Trokendi to 50 mg daily at bedtime.  I will also send in Nurtec 75 mg to be taken as needed for abortive therapy.  She may take 1 tablet at onset of headache.  She was advised to avoid Nurtec or other over-the-counter analgesics on a regular basis.  We will use Zofran as needed for nausea.  She was advised against regular use and warned of constipation.  We will give her Toradol 30 mg IM in the office today.  She was instructed to go home and rest.  She will call with any worsening.  She was encouraged to stay well-hydrated and focus on healthy lifestyle habits.  She will continue close follow-up with rheumatology and primary care for management of comorbidities.  She will follow-up with [place] in 6 months, sooner if needed.  She verbalizes understanding and agreement with this plan.   No orders of the defined types were placed in this encounter.    Meds ordered this encounter  Medications  . Topiramate ER (TROKENDI XR) 25  MG CP24    Sig: Take 1 capsule (25 mg total) by mouth at bedtime.    Dispense:  60 capsule    Refill:  5    Order Specific Question:   Supervising Provider    Answer:   Melvenia Beam [id number]  . ondansetron (ZOFRAN ODT) 4 MG disintegrating tablet    Sig: Take 1 tablet (4 mg total) by mouth every 8 (eight) hours as needed for nausea or vomiting.    Dispense:  20 tablet    Refill:  0    Order  Specific Question:   Supervising Provider    Answer:   Melvenia Beam [id number]  . Rimegepant Sulfate (NURTEC) 75 MG TBDP    Sig: Take 75 mg by mouth daily as needed (take for abortive therapy of migraine, no more than 1 tablet in 24 hours or 10 per month).    Dispense:  10 tablet    Refill:  11    Order Specific Question:   Supervising Provider    Answer:   Melvenia Beam [id number]  . ketorolac (TORADOL) 30 MG/ML injection 30 mg      I spent 15 minutes with the patient. 50% of this time was spent counseling and educating patient on plan of care and medications.    Debbora Presto, FNP-C 02/01/2020, 1:41 PM [facility] [address] [place], [place] [address] [phone]  Made any corrections needed, and agree with history, physical, neuro exam,assessment and plan as stated.     Sarina Ill, MD [facility]

## 2020-02-02 ENCOUNTER — Ambulatory Visit: Payer: Medicaid Other | Attending: Internal Medicine

## 2020-02-02 DIAGNOSIS — Z23 Encounter for immunization: Secondary | ICD-10-CM

## 2020-02-02 NOTE — Progress Notes (Signed)
   Covid-19 Vaccination Clinic  Name:  Debbie [name]    MRN: [id number] DOB: [birth year]  02/02/2020  [name] was observed post Covid-19 immunization for 15 minutes without incident. She was provided with Vaccine Information Sheet and instruction to access the V-Safe system.   [name] was instructed to call 911 with any severe reactions post vaccine: Marland Kitchen Difficulty breathing  . Swelling of face and throat  . A fast heartbeat  . A bad rash all over body  . Dizziness and weakness   Immunizations Administered    Name Date Dose VIS Date Route   Pfizer COVID-19 Vaccine 02/02/2020 12:12 PM 0.3 mL 11/24/2018 Intramuscular   Manufacturer: [place]   Lot: [id number]   [place]: [id number]

## 2020-02-07 ENCOUNTER — Encounter: Payer: Self-pay | Admitting: Family Medicine

## 2020-02-08 NOTE — Telephone Encounter (Signed)
Initiated PA Nurtec 75mg  po daily  on [facility] by faxing [phone]. Determination pending.

## 2020-02-10 NOTE — Telephone Encounter (Addendum)
[place] [facility] MEDICAID portal checked and the PA denied for nurtec.

## 2020-02-13 ENCOUNTER — Other Ambulatory Visit: Payer: Self-pay

## 2020-02-13 ENCOUNTER — Encounter (HOSPITAL_BASED_OUTPATIENT_CLINIC_OR_DEPARTMENT_OTHER): Payer: Self-pay | Admitting: Emergency Medicine

## 2020-02-13 ENCOUNTER — Emergency Department (HOSPITAL_BASED_OUTPATIENT_CLINIC_OR_DEPARTMENT_OTHER)
Admission: EM | Admit: 2020-02-13 | Discharge: 2020-02-14 | Disposition: A | Discharge status: Home / Self Care | Payer: Medicaid Other | Attending: Emergency Medicine | Admitting: Emergency Medicine

## 2020-02-13 DIAGNOSIS — Z886 Allergy status to analgesic agent status: Secondary | ICD-10-CM | POA: Insufficient documentation

## 2020-02-13 DIAGNOSIS — R300 Dysuria: Secondary | ICD-10-CM

## 2020-02-13 DIAGNOSIS — Z882 Allergy status to sulfonamides status: Secondary | ICD-10-CM | POA: Insufficient documentation

## 2020-02-13 DIAGNOSIS — E039 Hypothyroidism, unspecified: Secondary | ICD-10-CM | POA: Diagnosis not present

## 2020-02-13 DIAGNOSIS — M545 Low back pain, unspecified: Secondary | ICD-10-CM

## 2020-02-13 DIAGNOSIS — M3210 Systemic lupus erythematosus, organ or system involvement unspecified: Secondary | ICD-10-CM | POA: Diagnosis not present

## 2020-02-13 DIAGNOSIS — Z79899 Other long term (current) drug therapy: Secondary | ICD-10-CM | POA: Insufficient documentation

## 2020-02-13 DIAGNOSIS — R350 Frequency of micturition: Secondary | ICD-10-CM | POA: Diagnosis not present

## 2020-02-13 LAB — PREGNANCY, URINE: Preg Test, Ur: NEGATIVE

## 2020-02-13 LAB — URINALYSIS, ROUTINE W REFLEX MICROSCOPIC
Bilirubin Urine: NEGATIVE
Glucose, UA: NEGATIVE mg/dL
Hgb urine dipstick: NEGATIVE
Ketones, ur: NEGATIVE mg/dL
Leukocytes,Ua: NEGATIVE
Nitrite: NEGATIVE
Protein, ur: NEGATIVE mg/dL
Specific Gravity, Urine: 1.02 (ref 1.005–1.030)
pH: 7 (ref 5.0–8.0)

## 2020-02-13 MED ORDER — KETOROLAC TROMETHAMINE 15 MG/ML IJ SOLN
15.0000 mg | Freq: Once | INTRAMUSCULAR | Status: AC
  Administered 2020-02-13: 15 mg via INTRAMUSCULAR
  Filled 2020-02-13: qty 1

## 2020-02-13 MED ORDER — OXYCODONE-ACETAMINOPHEN 5-325 MG PO TABS
1.0000 | ORAL_TABLET | ORAL | Status: DC | PRN
  Administered 2020-02-13: 1 via ORAL
  Filled 2020-02-13: qty 1

## 2020-02-13 MED ORDER — ONDANSETRON 4 MG PO TBDP
4.0000 mg | ORAL_TABLET | Freq: Once | ORAL | Status: AC
  Administered 2020-02-13: 4 mg via ORAL
  Filled 2020-02-13: qty 1

## 2020-02-13 NOTE — ED Triage Notes (Signed)
Reports low back pain with urinary pain since yesterday.  Has not taken anything for pain.

## 2020-02-13 NOTE — ED Notes (Signed)
ED Provider at bedside. 

## 2020-02-14 MED ORDER — NAPROXEN 500 MG PO TABS: 500.0000 mg | ORAL_TABLET | Freq: Two times a day (BID) | ORAL | 0 refills | Status: DC

## 2020-02-14 NOTE — ED Notes (Signed)
Lab notified to add on urine culture 

## 2020-02-14 NOTE — ED Provider Notes (Signed)
[facility] EMERGENCY DEPARTMENT Provider Note   CSN: [id number] Arrival date & time: 02/13/20  2114     History Chief Complaint  Patient presents with  . Back Pain  . Urinary Frequency    Debbie [name] is a 39 y.o. female.  HPI     This is a 39 year old female with a history of hypothyroidism, lupus who presents with back pain and urinary frequency and dysuria.  Patient reports onset of symptoms yesterday.  She reports bilateral back pain that radiates into her bilateral abdomen.  It is worse with certain positions.  She also has some associated dysuria.  She states that that makes the pain worse.  No history of kidney stones.  Has not noted any hematuria.  Has not had any fevers.  Denies any weakness, numbness, tingling of the lower extremities.  She rates her pain at 8 out of 10.  She did not take anything for pain.  Patient denies any history of IV drug use.  She is on a monoclonal antibody for her lupus.  Past Medical History:  Diagnosis Date  . Anemia   . Chest pain   . Hypothyroidism   . Lupus ([place])   . Migraine   . Migraines   . Systemic lupus ([place])   . Thyroid disease   . UTI (urinary tract infection)   . Vaginal delivery 2002, 2009, 2015  . Varicose veins    Left > than right    Patient Active Problem List   Diagnosis Date Noted  . Abdominal pain 07/09/2019  . Postoperative pain 07/07/2019  . Postoperative state 06/23/2019  . SLE (systemic lupus erythematosus) ([place]) 05/19/2019  . Obesity (BMI 30.0-34.9) 05/19/2019  . Depression, recurrent ([place]) 05/19/2019  . Hypothyroidism 05/19/2019  . Active labor 03/22/2014  . Varicose veins of leg with pain 02/16/2013  . Laryngitis, acute 04/28/2012  . Vomiting 04/28/2012  . Fever 04/28/2012  . Subconjunctival hemorrhage sm prob from vomiting 04/28/2012  . Anemia 01/27/2012  . Migraine 02/18/2011  . Allergic rhinitis 02/18/2011  . Hypothyroid 02/18/2011    Past Surgical History:  Procedure Laterality  Date  . APPENDECTOMY    . CYSTOSCOPY N/A 06/23/2019   Procedure: CYSTOSCOPY;  Surgeon: Bobbye Charleston, MD;  Location: [facility];  Service: Gynecology;  Laterality: N/A;  . DILATION AND EVACUATION N/A 04/27/2013   Procedure: DILATATION AND EVACUATION;  Surgeon: Daria Pastures, MD;  Location: [facility];  Service: Gynecology;  Laterality: N/A;  . LAPAROSCOPIC TUBAL LIGATION Bilateral 06/08/2014   Procedure: LAPAROSCOPIC TUBAL LIGATION;  Surgeon: Daria Pastures, MD;  Location: [facility];  Service: Gynecology;  Laterality: Bilateral;  . ROBOTIC ASSISTED LAPAROSCOPIC HYSTERECTOMY AND SALPINGECTOMY Bilateral 06/23/2019   Procedure: XI ROBOTIC ASSISTED LAPAROSCOPIC HYSTERECTOMY AND SALPINGECTOMY;  Surgeon: Bobbye Charleston, MD;  Location: [facility];  Service: Gynecology;  Laterality: Bilateral;  TS RNFA confirmed on 06/08/19 CS  . TONSILLECTOMY  2001  . TONSILLECTOMY    . TONSILLECTOMY AND ADENOIDECTOMY    . UMBILICAL HERNIA REPAIR  03/2017   x2     OB History    Gravida  6   Para  3   Term  3   Preterm      AB  3   Living  3     SAB  2   TAB  1   Ectopic      Multiple      Live Births  3  Family History  Problem Relation Age of Onset  . Hypertension Mother   . Hyperlipidemia Father   . Diabetes Father   . Hypertension Father   . Arthritis Neg Hx        family  . Migraines Neg Hx     Social History   Tobacco Use  . Smoking status: Never Smoker  . Smokeless tobacco: Never Used  Substance Use Topics  . Alcohol use: Yes    Comment: SOCIALLY  . Drug use: No    Home Medications Prior to Admission medications   Medication Sig Start Date End Date Taking? Authorizing Provider  Belimumab (BENLYSTA IV) Inject into the vein. Every 4 weeks.   Yes [provider]  SYNTHROID 150 MCG tablet TAKE 1 TABLET(150 MCG) BY MOUTH DAILY BEFORE BREAKFAST 01/12/20  Yes Isaac Bliss, Rayford Halsted, MD  Topiramate ER (TROKENDI  XR) 25 MG CP24 Take 1 capsule (25 mg total) by mouth at bedtime. 02/01/20  Yes Lomax, Amy, NP  Cholecalciferol (VITAMIN D PO) Take 1,000 Units by mouth daily.     [provider]  ibuprofen (ADVIL) 800 MG tablet Take 1 tablet (800 mg total) by mouth every 8 (eight) hours. 06/24/19   Bobbye Charleston, MD  naproxen (NAPROSYN) 500 MG tablet Take 1 tablet (500 mg total) by mouth 2 (two) times daily. 02/14/20   Alessa Mazur, Barbette Hair, MD  ondansetron (ZOFRAN ODT) 4 MG disintegrating tablet Take 1 tablet (4 mg total) by mouth every 8 (eight) hours as needed for nausea or vomiting. 02/01/20   Lomax, Amy, NP  Rimegepant Sulfate (NURTEC) 75 MG TBDP Take 75 mg by mouth daily as needed (take for abortive therapy of migraine, no more than 1 tablet in 24 hours or 10 per month). 02/01/20   Lomax, Amy, NP    Allergies    Aspirin, Bactrim, and Sulfa antibiotics  Review of Systems   Review of Systems  Constitutional: Negative for fever.  Respiratory: Negative for shortness of breath.   Cardiovascular: Negative for chest pain.  Gastrointestinal: Positive for nausea. Negative for abdominal pain and vomiting.  Genitourinary: Positive for dysuria.  Musculoskeletal: Positive for back pain.  Neurological: Negative for headaches.  All other systems reviewed and are negative.   Physical Exam Updated Vital Signs BP 112/70 (BP Location: Right Arm)   Pulse 63   Temp 98.1 F (36.7 C) (Oral)   Resp 16   Ht 1.575 m (5\' 2" )   Wt 83.4 kg   LMP 05/17/2019 (Approximate)   SpO2 100%   BMI 33.63 kg/m   Physical Exam Vitals and nursing note reviewed.  Constitutional:      Appearance: She is well-developed. She is not ill-appearing.  HENT:     Head: Normocephalic and atraumatic.     Nose: Nose normal.     Mouth/Throat:     Mouth: Mucous membranes are moist.  Eyes:     Pupils: Pupils are equal, round, and reactive to light.  Cardiovascular:     Rate and Rhythm: Normal rate and regular rhythm.     Heart  sounds: Normal heart sounds.  Pulmonary:     Effort: Pulmonary effort is normal. No respiratory distress.     Breath sounds: No wheezing.  Abdominal:     General: Bowel sounds are normal.     Palpations: Abdomen is soft.     Tenderness: There is no abdominal tenderness. There is no right CVA tenderness or left CVA tenderness.  Musculoskeletal:  Cervical back: Neck supple.     Comments: Tenderness to palpation right greater than left lower lumbar paraspinous muscle region, no midline tenderness, step-off, deformity  Skin:    General: Skin is warm and dry.  Neurological:     General: No focal deficit present.     Mental Status: She is alert and oriented to person, place, and time.     Gait: Gait normal.  Psychiatric:        Mood and Affect: Mood normal.     ED Results / Procedures / Treatments   Labs (all labs ordered are listed, but only abnormal results are displayed) Labs Reviewed  URINE CULTURE  URINALYSIS, ROUTINE W REFLEX MICROSCOPIC  PREGNANCY, URINE    EKG None  Radiology No results found.  Procedures Procedures (including critical care time)  Medications Ordered in ED Medications  oxyCODONE-acetaminophen (PERCOCET/ROXICET) 5-325 MG per tablet 1 tablet (1 tablet Oral Given 02/13/20 2134)  ondansetron (ZOFRAN-ODT) disintegrating tablet 4 mg (4 mg Oral Given 02/13/20 2134)  ketorolac (TORADOL) 15 MG/ML injection 15 mg (15 mg Intramuscular Given 02/13/20 2356)    ED Course  I have reviewed the triage vital signs and the nursing notes.  Pertinent labs & imaging results that were available during my care of the patient were reviewed by me and considered in my medical decision making (see chart for details).    MDM Rules/Calculators/A&P                       Patient presents with bilateral back pain.  She is overall nontoxic and vital signs are reassuring.  She is afebrile.  No CVA tenderness on exam.  Given the bilateral nature of pain, would be atypical to  have bilateral kidney stones although this is a consideration.  UTI is also consideration as is musculoskeletal etiology.  She has no red flags for back pain although she is on a monoclonal antibody for her lupus.  She is neurologically intact.  Urinalysis is completely clean without any bacteria or white cells.  We will send culture given urinary symptoms.  Given that she does have some reproducible muscular pain on exam and some of her pain is worse with certain movements, would consider musculoskeletal etiology.  Will trial naproxen.  Would hold off on antibiotics given clean urinalysis today.  If this comes back positive, patient will be called with antibiotic for UTI.  After history, exam, and medical workup I feel the patient has been appropriately medically screened and is safe for discharge home. Pertinent diagnoses were discussed with the patient. Patient was given return precautions.   Final Clinical Impression(s) / ED Diagnoses Final diagnoses:  Acute bilateral low back pain without sciatica  Dysuria    Rx / DC Orders ED Discharge Orders         Ordered    naproxen (NAPROSYN) 500 MG tablet  2 times daily     02/14/20 0037           Sim Choquette, Barbette Hair, MD 02/14/20 [phone]

## 2020-02-14 NOTE — Discharge Instructions (Addendum)
You were seen today for back pain and urinary symptoms.  Your urinalysis is clean.  I did send a urine culture given your history.  You may have a muscle strain.  Take naproxen as needed for pain.  If you develop fevers, pain on one side or the other, any new or worsening symptoms you should be reevaluated.  If your urine culture grows out any bacteria, you will be called with a prescription for an antibiotic.

## 2020-02-16 LAB — URINE CULTURE: Culture: 20000 — AB

## 2020-02-17 ENCOUNTER — Telehealth: Payer: Self-pay | Admitting: *Deleted

## 2020-02-17 NOTE — Progress Notes (Signed)
ED Antimicrobial Stewardship Positive Culture Follow Up   Debbie [name] is an 38 y.o. female who presented to [facility] on 02/13/2020 with a chief complaint of  Chief Complaint  Patient presents with  . Back Pain  . Urinary Frequency    Recent Results (from the past 720 hour(s))  Urine culture     Status: Abnormal   Collection Time: 02/13/20 10:25 PM   Specimen: Urine, Clean Catch  Result Value Ref Range Status   Specimen Description   Final    URINE, CLEAN CATCH Performed at [facility], [address] [place], [place] [address]    Special Requests   Final    NONE Performed at [facility], [address] [place], [place] [address]    Culture 20,000 COLONIES/mL ESCHERICHIA COLI (A)  Final   Report Status 02/16/2020 FINAL  Final   Organism ID, Bacteria ESCHERICHIA COLI (A)  Final      Susceptibility   Escherichia coli - MIC*    AMPICILLIN >=32 RESISTANT Resistant     CEFAZOLIN <=4 SENSITIVE Sensitive     CEFTRIAXONE <=1 SENSITIVE Sensitive     CIPROFLOXACIN >=4 RESISTANT Resistant     GENTAMICIN >=16 RESISTANT Resistant     IMIPENEM <=0.25 SENSITIVE Sensitive     NITROFURANTOIN <=16 SENSITIVE Sensitive     TRIMETH/SULFA >=320 RESISTANT Resistant     AMPICILLIN/SULBACTAM 16 INTERMEDIATE Intermediate     PIP/TAZO <=4 SENSITIVE Sensitive     * 20,000 COLONIES/mL ESCHERICHIA COLI   [x]  Patient discharged originally without antimicrobial agent and treatment continues to not be warranted  New antibiotic prescription: none  ED Provider: Sharyn Lull, PA-C   Gillian Scarce, PharmD  02/17/2020, 9:17 AM Clinical Pharmacist Monday - Friday phone -  [phone] Saturday - Sunday phone - [phone]

## 2020-02-17 NOTE — Telephone Encounter (Signed)
Post ED Visit - Positive Culture Follow-up  Culture report reviewed by antimicrobial stewardship pharmacist: [place] Team []  Nathan Batchelder, Pharm.D. []  [address], Pharm.D., BCPS AQ-ID []  Heide Guile, Pharm.D., BCPS []  Parks Neptune, Pharm.D., BCPS []  [place], [place].D., BCPS, AAHIVP []  [place], Pharm.D., BCPS, AAHIVP []  Legrand Como, PharmD, BCPS []  Salome Arnt, PharmD, BCPS []  Johnnette Gourd, PharmD, BCPS []  Hughes Better, PharmD []  Leeroy Cha, PharmD, BCPS []  Laqueta Linden, PharmD Albertina Parr, [place] Team []  [address], PharmD []  Leodis Sias, PharmD []  Lindell Spar, PharmD []  Royetta Asal, Rph []  Graylin Shiver) Rema Fendt, PharmD []  Glennon Mac, PharmD []  Arlyn Dunning, PharmD []  Netta Cedars, PharmD []  Dia Sitter, PharmD []  Leone Haven, PharmD []  Gretta Arab, PharmD []  Theodis Shove, PharmD []  Peggyann Juba, PharmD   Positive urine culture No antibiotics and no further patient follow-up is required at this time.  Reuel Boom [facility] 02/17/2020, 12:28 PM

## 2020-02-25 ENCOUNTER — Encounter: Payer: Self-pay | Admitting: Family Medicine

## 2020-03-07 ENCOUNTER — Other Ambulatory Visit: Payer: Self-pay | Admitting: Internal Medicine

## 2020-03-07 DIAGNOSIS — E039 Hypothyroidism, unspecified: Secondary | ICD-10-CM

## 2020-06-15 DIAGNOSIS — F419 Anxiety disorder, unspecified: Secondary | ICD-10-CM | POA: Insufficient documentation

## 2020-07-04 ENCOUNTER — Other Ambulatory Visit: Payer: Self-pay

## 2020-07-04 ENCOUNTER — Emergency Department (HOSPITAL_BASED_OUTPATIENT_CLINIC_OR_DEPARTMENT_OTHER)
Admission: EM | Admit: 2020-07-04 | Discharge: 2020-07-04 | Disposition: A | Discharge status: Home / Self Care | Payer: Medicaid Other | Attending: Emergency Medicine | Admitting: Emergency Medicine

## 2020-07-04 ENCOUNTER — Encounter (HOSPITAL_BASED_OUTPATIENT_CLINIC_OR_DEPARTMENT_OTHER): Payer: Self-pay | Admitting: Emergency Medicine

## 2020-07-04 DIAGNOSIS — L02411 Cutaneous abscess of right axilla: Secondary | ICD-10-CM | POA: Diagnosis not present

## 2020-07-04 DIAGNOSIS — E039 Hypothyroidism, unspecified: Secondary | ICD-10-CM | POA: Insufficient documentation

## 2020-07-04 DIAGNOSIS — Z79899 Other long term (current) drug therapy: Secondary | ICD-10-CM | POA: Diagnosis not present

## 2020-07-04 DIAGNOSIS — L0291 Cutaneous abscess, unspecified: Secondary | ICD-10-CM

## 2020-07-04 MED ORDER — LIDOCAINE HCL (PF) 1 % IJ SOLN
5.0000 mL | Freq: Once | INTRAMUSCULAR | Status: AC
  Administered 2020-07-04: 5 mL
  Filled 2020-07-04: qty 5

## 2020-07-04 MED ORDER — OXYCODONE-ACETAMINOPHEN 5-325 MG PO TABS
1.0000 | ORAL_TABLET | Freq: Once | ORAL | Status: AC
  Administered 2020-07-04: 1 via ORAL
  Filled 2020-07-04: qty 1

## 2020-07-04 MED ORDER — DOXYCYCLINE HYCLATE 100 MG PO CAPS: 100.0000 mg | ORAL_CAPSULE | Freq: Two times a day (BID) | ORAL | 0 refills | Status: AC

## 2020-07-04 NOTE — Discharge Instructions (Signed)
Please follow-up with your primary doctor, to be seen in 2 days for recheck of your right axilla abscess.  Recommend warm compresses at least 2-3 times daily to encourage draining and decrease swelling.  Take antibiotic as prescribed.  If you develop worsening swelling, fevers, redness, return to ER for reassessment.

## 2020-07-04 NOTE — ED Triage Notes (Signed)
Abscess under R axilla for a few weeks.

## 2020-07-05 NOTE — ED Provider Notes (Signed)
[facility] EMERGENCY DEPARTMENT Provider Note   CSN: [id number] Arrival date & time: 07/04/20  1497     History Chief Complaint  Patient presents with  . Abscess    Debbie [name] is a 39 y.o. female.  Presents to ER with concern for abscess.  Patient reports over the last few weeks she has noted some swelling, pain, redness to her right axilla.  Concern for possible abscess.  She noted a little bit of drainage from the abscess, but still having pain and swelling.  Has been doing warm compresses with minimal relief. A few days ago she also noted a second possible abscess.  No fevers.  Does have history of lupus.  Has a primary care doctor.  HPI     Past Medical History:  Diagnosis Date  . Anemia   . Chest pain   . Hypothyroidism   . Lupus ([place])   . Migraine   . Migraines   . Systemic lupus ([place])   . Thyroid disease   . UTI (urinary tract infection)   . Vaginal delivery 2002, 2009, 2015  . Varicose veins    Left > than right    Patient Active Problem List   Diagnosis Date Noted  . Abdominal pain 07/09/2019  . Postoperative pain 07/07/2019  . Postoperative state 06/23/2019  . SLE (systemic lupus erythematosus) ([place]) 05/19/2019  . Obesity (BMI 30.0-34.9) 05/19/2019  . Depression, recurrent ([place]) 05/19/2019  . Hypothyroidism 05/19/2019  . Active labor 03/22/2014  . Varicose veins of leg with pain 02/16/2013  . Laryngitis, acute 04/28/2012  . Vomiting 04/28/2012  . Fever 04/28/2012  . Subconjunctival hemorrhage sm prob from vomiting 04/28/2012  . Anemia 01/27/2012  . Migraine 02/18/2011  . Allergic rhinitis 02/18/2011  . Hypothyroid 02/18/2011    Past Surgical History:  Procedure Laterality Date  . APPENDECTOMY    . CYSTOSCOPY N/A 06/23/2019   Procedure: CYSTOSCOPY;  Surgeon: Bobbye Charleston, MD;  Location: [facility];  Service: Gynecology;  Laterality: N/A;  . DILATION AND EVACUATION N/A 04/27/2013   Procedure: DILATATION AND  EVACUATION;  Surgeon: Daria Pastures, MD;  Location: [facility];  Service: Gynecology;  Laterality: N/A;  . LAPAROSCOPIC TUBAL LIGATION Bilateral 06/08/2014   Procedure: LAPAROSCOPIC TUBAL LIGATION;  Surgeon: Daria Pastures, MD;  Location: [facility];  Service: Gynecology;  Laterality: Bilateral;  . ROBOTIC ASSISTED LAPAROSCOPIC HYSTERECTOMY AND SALPINGECTOMY Bilateral 06/23/2019   Procedure: XI ROBOTIC ASSISTED LAPAROSCOPIC HYSTERECTOMY AND SALPINGECTOMY;  Surgeon: Bobbye Charleston, MD;  Location: [facility];  Service: Gynecology;  Laterality: Bilateral;  TS RNFA confirmed on 06/08/19 CS  . TONSILLECTOMY  2001  . TONSILLECTOMY    . TONSILLECTOMY AND ADENOIDECTOMY    . UMBILICAL HERNIA REPAIR  03/2017   x2     OB History    Gravida  6   Para  3   Term  3   Preterm      AB  3   Living  3     SAB  2   TAB  1   Ectopic      Multiple      Live Births  3           Family History  Problem Relation Age of Onset  . Hypertension Mother   . Hyperlipidemia Father   . Diabetes Father   . Hypertension Father   . Arthritis Neg Hx        family  . Migraines Neg Hx  Social History   Tobacco Use  . Smoking status: Never Smoker  . Smokeless tobacco: Never Used  Vaping Use  . Vaping Use: Never used  Substance Use Topics  . Alcohol use: Yes    Comment: SOCIALLY  . Drug use: No    Home Medications Prior to Admission medications   Medication Sig Start Date End Date Taking? Authorizing Provider  Belimumab (BENLYSTA IV) Inject into the vein. Every 4 weeks.    [provider]  Cholecalciferol (VITAMIN D PO) Take 1,000 Units by mouth daily.     [provider]  doxycycline (VIBRAMYCIN) 100 MG capsule Take 1 capsule (100 mg total) by mouth 2 (two) times daily for 7 days. 07/04/20 07/11/20  Lucrezia Starch, MD  ibuprofen (ADVIL) 800 MG tablet Take 1 tablet (800 mg total) by mouth every 8 (eight) hours. 06/24/19   Bobbye Charleston, MD   naproxen (NAPROSYN) 500 MG tablet Take 1 tablet (500 mg total) by mouth 2 (two) times daily. 02/14/20   Horton, Barbette Hair, MD  ondansetron (ZOFRAN ODT) 4 MG disintegrating tablet Take 1 tablet (4 mg total) by mouth every 8 (eight) hours as needed for nausea or vomiting. 02/01/20   Lomax, Amy, NP  Rimegepant Sulfate (NURTEC) 75 MG TBDP Take 75 mg by mouth daily as needed (take for abortive therapy of migraine, no more than 1 tablet in 24 hours or 10 per month). 02/01/20   Lomax, Amy, NP  SYNTHROID 150 MCG tablet TAKE 1 TABLET(150 MCG) BY MOUTH DAILY BEFORE BREAKFAST 01/12/20   Isaac Bliss, Rayford Halsted, MD  Topiramate ER (TROKENDI XR) 25 MG CP24 Take 1 capsule (25 mg total) by mouth at bedtime. 02/01/20   Lomax, Amy, NP    Allergies    Aspirin, Bactrim, and Sulfa antibiotics  Review of Systems   Review of Systems  Constitutional: Negative for chills and fever.  HENT: Negative for ear pain and sore throat.   Eyes: Negative for pain and visual disturbance.  Respiratory: Negative for cough and shortness of breath.   Cardiovascular: Negative for chest pain and palpitations.  Gastrointestinal: Negative for abdominal pain and vomiting.  Genitourinary: Negative for dysuria and hematuria.  Musculoskeletal: Negative for arthralgias and back pain.  Skin: Negative for color change and rash.  Neurological: Negative for seizures and syncope.  All other systems reviewed and are negative.   Physical Exam Updated Vital Signs BP 110/62 (BP Location: Right Arm)   Pulse 63   Temp 98.7 F (37.1 C) (Oral)   Resp 16   Ht 5\' 2"  (1.575 m)   Wt 83.9 kg   LMP 05/17/2019 (Approximate)   SpO2 100%   BMI 33.84 kg/m   Physical Exam Vitals and nursing note reviewed.  Constitutional:      General: She is not in acute distress.    Appearance: She is well-developed.  HENT:     Head: Normocephalic and atraumatic.  Eyes:     Conjunctiva/sclera: Conjunctivae normal.  Cardiovascular:     Rate and Rhythm:  Normal rate.     Pulses: Normal pulses.  Pulmonary:     Effort: Pulmonary effort is normal. No respiratory distress.  Musculoskeletal:        General: No deformity or signs of injury.     Cervical back: Neck supple.  Skin:    General: Skin is warm and dry.     Comments: R axilla: 1 area of swelling/induration ~1cm in diameter, 1 area of swelling/induration ~2cm in diameter with  mild overlying erythema, small purulence noted  Neurological:     Mental Status: She is alert.  Psychiatric:        Mood and Affect: Mood normal.        Behavior: Behavior normal.     ED Results / Procedures / Treatments   Labs (all labs ordered are listed, but only abnormal results are displayed) Labs Reviewed - No data to display  EKG None  Radiology No results found.  Procedures .Marland KitchenIncision and Drainage  Date/Time: 07/05/2020 9:54 AM Performed by: Lucrezia Starch, MD Authorized by: Lucrezia Starch, MD   Consent:    Consent obtained:  Verbal   Consent given by:  Patient   Risks discussed:  Bleeding, damage to other organs, incomplete drainage, infection and pain   Alternatives discussed:  No treatment, delayed treatment, alternative treatment and observation Location:    Type:  Abscess   Size:  2 cm   Location: R axilla. Pre-procedure details:    Skin preparation:  Betadine Anesthesia (see MAR for exact dosages):    Anesthesia method:  Local infiltration   Local anesthetic:  Lidocaine 1% w/o epi Procedure type:    Complexity:  Complex Procedure details:    Incision types:  Single straight   Scalpel blade:  11   Wound management:  Probed and deloculated   Drainage:  Purulent   Drainage amount:  Copious   Wound treatment:  Wound left open   Packing materials:  None Post-procedure details:    Patient tolerance of procedure:  Tolerated well, no immediate complications Comments:     Prepped with betadine, used lidocaine for local anesthetic. Made one incision in each abscess.  Both had good drainage. Utilized gentle probing to deloculate. Patient tolerated well.    (including critical care time)  Medications Ordered in ED Medications  lidocaine (PF) (XYLOCAINE) 1 % injection 5 mL (5 mLs Infiltration Given 07/04/20 1116)  oxyCODONE-acetaminophen (PERCOCET/ROXICET) 5-325 MG per tablet 1 tablet (1 tablet Oral Given 07/04/20 1116)    ED Course  I have reviewed the triage vital signs and the nursing notes.  Pertinent labs & imaging results that were available during my care of the patient were reviewed by me and considered in my medical decision making (see chart for details).    MDM Rules/Calculators/A&P                          39 year old lady presents to ER with concern for abscess.  She has 2 discrete abscesses on her right axilla.  Performed incision and drainage, good drainage of both of these abscesses.  Due to the overlying erythema albeit relatively mild, will cover with course of doxycycline.  Recommended recheck of abscesses with primary doctor in the next couple days.    After the discussed management above, the patient was determined to be safe for discharge.  The patient was in agreement with this plan and all questions regarding their care were answered.  ED return precautions were discussed and the patient will return to the ED with any significant worsening of condition.  Final Clinical Impression(s) / ED Diagnoses Final diagnoses:  Abscess    Rx / DC Orders ED Discharge Orders         Ordered    doxycycline (VIBRAMYCIN) 100 MG capsule  2 times daily        07/04/20 1212           Lucrezia Starch, MD 07/05/20  1034  

## 2020-07-13 DIAGNOSIS — L209 Atopic dermatitis, unspecified: Secondary | ICD-10-CM | POA: Insufficient documentation

## 2020-07-31 NOTE — Progress Notes (Signed)
New Patient Note  RE: Debbie [name] MRN: [id number] DOB: 13-Jun-1981 Date of Office Visit: 08/01/2020  Referring provider: Margarito Courser, MD Primary care provider: Margarito Courser, MD  Chief Complaint: Allergic Rhinitis  (symptoms since June 2021 causing itching in the throat and coughing ) and Eczema  History of Present Illness: I had the pleasure of seeing Debbie [name] for initial evaluation at the [facility] on 08/02/2020. She is a 39 y.o. female, who is referred here by Margarito Courser, MD for the evaluation of atopic dermatitis and allergic rhinitis.  Rhinitis: She reports symptoms of itchy throat, coughing, itchy eyes, itchy skin. Symptoms have been going on since June 2021 when she was in the [place] The symptoms are present throughout the day and sometimes it wakes her up at night. Anosmia: no. Headache: sometimes. She has used zyrtec, benadryl, azelastine and Flonase with minimal improvement in symptoms. Sinus infections: 3 since the summer. Previous work up includes: none. Previous ENT evaluation: no. Previous sinus imaging: no. History of nasal polyps: no. Last eye exam: 2 years ago. History of reflux: no.  Rash/itching:  Patient has lupus and follows with rheumatology. Current rash is different than the lupus rash per patient report. Rash started about 1 month ago. Mainly occurs on her arms, face. Describes them as itchy, red, flat. Individual rashes lasts about 2-3 days a few times per week. No ecchymosis upon resolution. Associated symptoms include: none. Suspected triggers are unknown. Denies any fevers, chills, changes in medications, foods, personal care products. She has tried the following therapies: zyrtec and benadryl with minimal benefit. Systemic steroids yes with some benefit.   Assessment and Plan: Debbie [name] is a 39 y.o. female with: Other allergic rhinitis Symptoms of itchy throat/eyes/skin and coughing since June 2021. Persistent throughout  the day and sometimes wakes her up at night. Tried Zyrtec, Benadryl, azelastine and Flonase with minimal benefit. 3 sinus infections since the summer. No previous allergy/ENT evaluation.  Today's skin testing showed: Positive to one mold and one tree. Borderline to weed. Negative to other allergens including hamster.   Start environmental control measures as below  -discussed with patient that the above allergens do not explain her current symptoms. Will get blood work instead of intradermals to further investigate as she is getting blood work already.  Recommend referral to ENT but patient would like to try the below regimen first.  May use over the counter antihistamines such as Zyrtec (cetirizine) 10mg  daily. May take twice a day during flares.  May use azelastine nasal spray 1-2 sprays per nostril twice a day as needed for runny nose/drainage.  Nasal saline spray (i.e., Simply Saline) or nasal saline lavage (i.e., NeilMed) is recommended as needed and prior to medicated nasal sprays.  May use olopatadine eye drops 0.2% once a day as needed for itchy/watery eyes.  If it causes too much dryness then stop and let us know.   Rash and other nonspecific skin eruption Patient has a history of lupus and follows with rheumatology. Rash started about 1 month ago and mainly occurs in her arm and face. Describes it as itchy, red and flat. No triggers noted. Tried Zyrtec and Benadryl with minimal benefit. Systemic steroids seem to help.  Start proper skin care as below.  Keep track of outbreaks and take pictures.  Not sure what's causing these rashes at this time.   Get blood work to rule out other etiologies.  Check with rheumatology to make sure it's not  patient's lupus flaring up.  History of asthma History of asthma as a child and used to have an albuterol nebulizer. Denies any respiratory symptoms.  Today's spirometry was normal.  Monitor symptoms.  Adverse food reaction Currently  avoiding pineapple, eggs and straight dairy. This seemed to cause nausea, vomiting and abdominal pains. Tolerates lactose free dairy.  Today's skin testing was borderline positive pineapple and negative to other common foods. Will double check via blood work.  Avoid foods that bother you - pineapple, eggs.  Okay with lactose free dairy -most likely is lactose intolerant.   For mild symptoms you can take over the counter antihistamines such as Benadryl and monitor symptoms closely. If symptoms worsen or if you have severe symptoms including breathing issues, throat closure, significant swelling, whole body hives, severe diarrhea and vomiting, lightheadedness then seek immediate medical care.  Return in about 3 months (around 11/01/2020).  Meds ordered this encounter  Medications  . Olopatadine HCl 0.2 % SOLN    Sig: Apply 1 drop to eye daily as needed (itchy/watery eyes).    Dispense:  2.5 mL    Refill:  5    Lab Orders     Alpha-Gal Panel     Allergens w/Total IgE Area 2     Chronic Urticaria     Tryptase     Allergen, Pineapple, f210     Allergen Egg White  Other allergy screening: Asthma: used to have albuterol nebulizer as a teenager. Food allergy:   Cheese and dairy causes abdominal issues with nausea, indigestion and vomiting at times. Currently takes lactose free milk with good benefit.  Pineapple and eggs causes nausea, vomiting and abdominal pain.  Medication allergy: yes Hymenoptera allergy: no History of recurrent infections suggestive of immunodeficency: no  Diagnostics: Spirometry:  Tracings reviewed. Her effort: Good reproducible efforts. FVC: 2.74L FEV1 2.32L, 82% predicted FEV1/FVC ratio: 85% Interpretation: No overt abnormalities noted given today's efforts.  Please see scanned spirometry results for details.  Skin Testing: Environmental allergy panel and select foods. Positive to one mold and one tree. Borderline to weed. Negative to other allergens  including hamster.  Borderline to pineapple, negative to other common foods. Results discussed with patient/family.  Airborne Adult Perc - 08/01/20 1003    Time Antigen Placed 1003    Allergen Manufacturer Greer    Location Back    Number of Test 59    Panel 1 Select    1. Control-Buffer 50% Glycerol Negative    2. Control-Histamine 1 mg/ml 2+    3. Albumin saline Negative    4. Kilmarnock Negative    5. Guatemala Negative    6. Johnson Negative    7. Chicora Blue Negative    8. Meadow Fescue Negative    9. Perennial Rye Negative    10. Sweet Vernal Negative    11. Timothy Negative    12. Cocklebur Negative    13. Burweed Marshelder Negative    14. Ragweed, short Negative    15. Ragweed, Giant Negative    16. Plantain,  English Negative    17. Lamb's Quarters Negative    18. Sheep Sorrell Negative    19. Rough Pigweed Negative    20. Marsh Elder, Rough Negative    21. Mugwort, Common --   +/-   22. Ash mix --   +/-   23. Birch mix Negative    24. Beech American Negative    25. Box, Elder Negative    26. Cedar, red  Negative    27. Cottonwood, [place] Negative    28. Elm mix Negative    29. Hickory Negative    30. Maple mix Negative    31. Oak, [place] mix 2+    32. Pecan Pollen Negative    33. Pine mix Negative    34. Sycamore [place] --   +/-   35. Blades, Black Pollen Negative    36. Alternaria alternata Negative    37. Cladosporium Herbarum Negative    38. Aspergillus mix Negative    39. Penicillium mix Negative    40. Bipolaris sorokiniana (Helminthosporium) Negative    41. Drechslera spicifera (Curvularia) --   +/-   42. Mucor plumbeus Negative    43. Fusarium moniliforme Negative    44. Aureobasidium pullulans (pullulara) Negative    45. Rhizopus oryzae Negative    46. Botrytis cinera Negative    47. Epicoccum nigrum Negative    48. Phoma betae Negative    49. Candida Albicans Negative    50. Trichophyton mentagrophytes 2+    51. Mite, D Farinae  5,000 AU/ml  Negative    52. Mite, D Pteronyssinus  5,000 AU/ml Negative    53. Cat Hair 10,000 BAU/ml Negative    54.  Dog Epithelia Negative    55. Mixed Feathers Negative    56. Horse Epithelia Negative    57. Cockroach, German Negative    58. Mouse Negative    59. Tobacco Leaf Negative          Food Adult Perc - 08/01/20 1000    Time Antigen Placed 1004    Allergen Manufacturer Lavella Hammock    Location Back    Number of allergen test 13    Control-Histamine 1 mg/ml 2+    1. Peanut Negative    2. Soybean Negative    3. Wheat Negative    4. Sesame Negative    5. Milk, cow Negative    6. Egg White, Chicken Negative    7. Casein Negative    8. Shellfish Mix Negative    9. Fish Mix Negative    10. Cashew Negative    63. Pineapple --   +/-   1. Hamster Negative           Past Medical History: Patient Active Problem List   Diagnosis Date Noted  . Rash and other nonspecific skin eruption 08/01/2020  . History of asthma 08/01/2020  . Adverse food reaction 08/01/2020  . Abdominal pain 07/09/2019  . Postoperative pain 07/07/2019  . Postoperative state 06/23/2019  . SLE (systemic lupus erythematosus) ([place]) 05/19/2019  . Obesity (BMI 30.0-34.9) 05/19/2019  . Depression, recurrent ([place]) 05/19/2019  . Hypothyroidism 05/19/2019  . Active labor 03/22/2014  . Varicose veins of leg with pain 02/16/2013  . Laryngitis, acute 04/28/2012  . Vomiting 04/28/2012  . Fever 04/28/2012  . Subconjunctival hemorrhage sm prob from vomiting 04/28/2012  . Anemia 01/27/2012  . Migraine 02/18/2011  . Other allergic rhinitis 02/18/2011  . Hypothyroid 02/18/2011   Past Medical History:  Diagnosis Date  . Anemia   . Chest pain   . Hypothyroidism   . Lupus ([place])   . Migraine   . Migraines   . Systemic lupus ([place])   . Thyroid disease   . UTI (urinary tract infection)   . Vaginal delivery 2002, 2009, 2015  . Varicose veins    Left > than right   Past Surgical History: Past Surgical History:   Procedure Laterality Date  . APPENDECTOMY    .  CYSTOSCOPY N/A 06/23/2019   Procedure: CYSTOSCOPY;  Surgeon: Bobbye Charleston, MD;  Location: [facility];  Service: Gynecology;  Laterality: N/A;  . DILATION AND EVACUATION N/A 04/27/2013   Procedure: DILATATION AND EVACUATION;  Surgeon: Daria Pastures, MD;  Location: [facility];  Service: Gynecology;  Laterality: N/A;  . LAPAROSCOPIC TUBAL LIGATION Bilateral 06/08/2014   Procedure: LAPAROSCOPIC TUBAL LIGATION;  Surgeon: Daria Pastures, MD;  Location: [facility];  Service: Gynecology;  Laterality: Bilateral;  . ROBOTIC ASSISTED LAPAROSCOPIC HYSTERECTOMY AND SALPINGECTOMY Bilateral 06/23/2019   Procedure: XI ROBOTIC ASSISTED LAPAROSCOPIC HYSTERECTOMY AND SALPINGECTOMY;  Surgeon: Bobbye Charleston, MD;  Location: [facility];  Service: Gynecology;  Laterality: Bilateral;  TS RNFA confirmed on 06/08/19 CS  . TONSILLECTOMY  2001  . TONSILLECTOMY    . TONSILLECTOMY AND ADENOIDECTOMY    . UMBILICAL HERNIA REPAIR  03/2017   x2   Medication List:  Current Outpatient Medications  Medication Sig Dispense Refill  . Belimumab (BENLYSTA IV) Inject into the vein. Every 4 weeks.    Marland Kitchen buPROPion (WELLBUTRIN) 100 MG tablet Take by mouth at bedtime.    . Cholecalciferol (VITAMIN D PO) Take 1,000 Units by mouth daily.     Marland Kitchen ibuprofen (ADVIL) 800 MG tablet Take 1 tablet (800 mg total) by mouth every 8 (eight) hours. 30 tablet 0  . naproxen (NAPROSYN) 500 MG tablet Take 1 tablet (500 mg total) by mouth 2 (two) times daily. 30 tablet 0  . ondansetron (ZOFRAN ODT) 4 MG disintegrating tablet Take 1 tablet (4 mg total) by mouth every 8 (eight) hours as needed for nausea or vomiting. 20 tablet 0  . Rimegepant Sulfate (NURTEC) 75 MG TBDP Take 75 mg by mouth daily as needed (take for abortive therapy of migraine, no more than 1 tablet in 24 hours or 10 per month). 10 tablet 11  . SYNTHROID 150 MCG tablet TAKE 1 TABLET(150 MCG) BY MOUTH DAILY  BEFORE BREAKFAST 30 tablet 0  . Topiramate ER (TROKENDI XR) 25 MG CP24 Take 1 capsule (25 mg total) by mouth at bedtime. 60 capsule 5  . Olopatadine HCl 0.2 % SOLN Apply 1 drop to eye daily as needed (itchy/watery eyes). 2.5 mL 5   No current facility-administered medications for this visit.   Allergies: Allergies  Allergen Reactions  . Amoxicillin Rash  . Aspirin Hives    Pt states she can take Ibuprofen  . Diflucan [Fluconazole] Hives  . Bactrim Swelling and Rash  . Sulfa Antibiotics Swelling and Rash   Social History: Social History   Socioeconomic History  . Marital status: Single    Spouse name: Not on file  . Number of children: 3  . Years of education: College   . Highest education level: Not on file  Occupational History  . Not on file  Tobacco Use  . Smoking status: Never Smoker  . Smokeless tobacco: Never Used  Vaping Use  . Vaping Use: Never used  Substance and Sexual Activity  . Alcohol use: Yes    Comment: SOCIALLY  . Drug use: No  . Sexual activity: Not Currently    Birth control/protection: Surgical  Other Topics Concern  . Not on file  Social History Narrative   Lives at home with her children   Caffeine use: Drinks 1 cup coffee per day   Right handed   Social Determinants of Health   Financial Resource Strain:   . Difficulty of Paying Living Expenses: Not on file  Food Insecurity:   .  Worried About Charity fundraiser in the Last Year: Not on file  . Ran Out of Food in the Last Year: Not on file  Transportation Needs:   . Lack of Transportation (Medical): Not on file  . Lack of Transportation (Non-Medical): Not on file  Physical Activity:   . Days of Exercise per Week: Not on file  . Minutes of Exercise per Session: Not on file  Stress:   . Feeling of Stress : Not on file  Social Connections:   . Frequency of Communication with Friends and Family: Not on file  . Frequency of Social Gatherings with Friends and Family: Not on file  .  Attends Religious Services: Not on file  . Active Member of Clubs or Organizations: Not on file  . Attends Archivist Meetings: Not on file  . Marital Status: Not on file   Lives in a 39 year old home. Smoking: denies Occupation: on disability  Environmental History: Water Damage/mildew in the house: no Carpet in the family room: no Carpet in the bedroom: no Heating: electric Cooling: central Pet: yes 3 dogs x 1 yr, 5 yrs, 1 yr; 5 birds x 1 yr, 1 hamster x 1 yr  Family History: Family History  Problem Relation Age of Onset  . Hypertension Mother   . Hyperlipidemia Father   . Diabetes Father   . Hypertension Father   . Arthritis Neg Hx        family  . Migraines Neg Hx    Problem                               Relation Asthma                                   Daughter  Eczema                                No  Food allergy                          No  Allergic rhino conjunctivitis     Mother   Review of Systems  Constitutional: Negative for appetite change, chills, fever and unexpected weight change.  HENT: Negative for congestion and rhinorrhea.   Eyes: Positive for itching.  Respiratory: Positive for cough. Negative for chest tightness, shortness of breath and wheezing.   Cardiovascular: Negative for chest pain.  Gastrointestinal: Negative for abdominal pain.  Genitourinary: Negative for difficulty urinating.  Skin: Positive for rash.  Allergic/Immunologic: Positive for environmental allergies.  Neurological: Negative for headaches.   Objective: BP 102/64   Pulse 80   Temp 97.6 F (36.4 C) (Temporal)   Resp 18   Ht 5\' 2"  (1.575 m)   Wt 186 lb 6.4 oz (84.6 kg)   LMP 05/17/2019 (Approximate)   SpO2 98%   BMI 34.09 kg/m  Body mass index is 34.09 kg/m. Physical Exam Vitals and nursing note reviewed.  Constitutional:      Appearance: Normal appearance. She is well-developed.  HENT:     Head: Normocephalic and atraumatic.     Right Ear: External  ear normal.     Left Ear: External ear normal.     Nose: Nose normal.     Mouth/Throat:  Mouth: Mucous membranes are moist.     Pharynx: Oropharynx is clear.  Eyes:     Conjunctiva/sclera: Conjunctivae normal.  Cardiovascular:     Rate and Rhythm: Normal rate and regular rhythm.     Heart sounds: Normal heart sounds. No murmur heard.  No friction rub. No gallop.   Pulmonary:     Effort: Pulmonary effort is normal.     Breath sounds: Normal breath sounds. No wheezing, rhonchi or rales.  Abdominal:     Palpations: Abdomen is soft.  Musculoskeletal:     Cervical back: Neck supple.  Skin:    General: Skin is warm.     Findings: No rash.  Neurological:     Mental Status: She is alert and oriented to person, place, and time.  Psychiatric:        Behavior: Behavior normal.    The plan was reviewed with the patient/family, and all questions/concerned were addressed.  It was my pleasure to see Debbie [name] today and participate in her care. Please feel free to contact me with any questions or concerns.  Sincerely,  Rexene Alberts, DO Allergy & Immunology  [facility] office: [place] office: [phone]

## 2020-08-01 ENCOUNTER — Encounter: Payer: Self-pay | Admitting: Allergy

## 2020-08-01 ENCOUNTER — Other Ambulatory Visit: Payer: Self-pay

## 2020-08-01 ENCOUNTER — Ambulatory Visit (INDEPENDENT_AMBULATORY_CARE_PROVIDER_SITE_OTHER): Payer: Medicaid Other | Admitting: Allergy

## 2020-08-01 VITALS — BP 102/64 | HR 80 | Temp 97.6°F | Resp 18 | Ht 62.0 in | Wt 186.4 lb

## 2020-08-01 DIAGNOSIS — Z8709 Personal history of other diseases of the respiratory system: Secondary | ICD-10-CM

## 2020-08-01 DIAGNOSIS — T781XXA Other adverse food reactions, not elsewhere classified, initial encounter: Secondary | ICD-10-CM | POA: Insufficient documentation

## 2020-08-01 DIAGNOSIS — R21 Rash and other nonspecific skin eruption: Secondary | ICD-10-CM

## 2020-08-01 DIAGNOSIS — J3089 Other allergic rhinitis: Secondary | ICD-10-CM | POA: Diagnosis not present

## 2020-08-01 DIAGNOSIS — T781XXD Other adverse food reactions, not elsewhere classified, subsequent encounter: Secondary | ICD-10-CM

## 2020-08-01 MED ORDER — OLOPATADINE HCL 0.2 % OP SOLN: 1.0000 [drp] | Freq: Every day | OPHTHALMIC | 5 refills | Status: DC | PRN

## 2020-08-01 NOTE — Patient Instructions (Addendum)
Today's skin testing showed: Positive to one mold and one tree. Borderline to weed. Negative to other allergens including hamster.  Borderline to pineapple, negative to other common foods.  Results given.   Environmental allergies  Start environmental control measures as below.  May use over the counter antihistamines such as Zyrtec (cetirizine) 10mg  daily. May take twice a day during flares.  May use azelastine nasal spray 1-2 sprays per nostril twice a day as needed for runny nose/drainage.  Nasal saline spray (i.e., Simply Saline) or nasal saline lavage (i.e., NeilMed) is recommended as needed and prior to medicated nasal sprays.  May use olopatadine eye drops 0.2% once a day as needed for itchy/watery eyes.  If it causes too much dryness then stop and let us know.   Rash:  Start proper skin care as below.  Keep track of outbreaks and take pictures.  Not sure what's causing your rashes at this time.   Check with rheumatology to make sure it's not a lupus rash.    Foods:  Avoid foods that bother you - pineapple, eggs.  Okay with latcose free dairy - you are most likely lactose intolerant.   For mild symptoms you can take over the counter antihistamines such as Benadryl and monitor symptoms closely. If symptoms worsen or if you have severe symptoms including breathing issues, throat closure, significant swelling, whole body hives, severe diarrhea and vomiting, lightheadedness then seek immediate medical care.  Breathing:  Today's breathing test was normal.  . Get bloodwork:  o We are ordering labs, so please allow 1-2 weeks for the results to come back. o With the newly implemented Cures Act, the labs might be visible to you at the same time that they become visible to me. However, I will not address the results until all of the results are back, so please be patient.  o In the meantime, continue recommendations in your patient instructions, including avoidance measures  (if applicable), until you hear from me.  Follow up in 3 months or sooner if needed.    Skin care recommendations  Bath time: . Always use lukewarm water. AVOID very hot or cold water. Marland Kitchen Keep bathing time to 5-10 minutes. . Do NOT use bubble bath. . Use a mild soap and use just enough to wash the dirty areas. . Do NOT scrub skin vigorously.  . After bathing, pat dry your skin with a towel. Do NOT rub or scrub the skin.  Moisturizers and prescriptions:  . ALWAYS apply moisturizers immediately after bathing (within 3 minutes). This helps to lock-in moisture. . Use the moisturizer several times a day over the whole body. Kermit Balo summer moisturizers include: Aveeno, CeraVe, Cetaphil. Kermit Balo winter moisturizers include: Aquaphor, Vaseline, Cerave, Cetaphil, Eucerin, Vanicream. . When using moisturizers along with medications, the moisturizer should be applied about one hour after applying the medication to prevent diluting effect of the medication or moisturize around where you applied the medications. When not using medications, the moisturizer can be continued twice daily as maintenance.  Laundry and clothing: . Avoid laundry products with added color or perfumes. . Use unscented hypo-allergenic laundry products such as Tide free, Cheer free & gentle, and All free and clear.  . If the skin still seems dry or sensitive, you can try double-rinsing the clothes. . Avoid tight or scratchy clothing such as wool. . Do not use fabric softeners or dyer sheets.  Reducing Pollen Exposure . Pollen seasons: trees (spring), grass (summer) and ragweed/weeds (fall). Marland Kitchen  Keep windows closed in your home and car to lower pollen exposure.  Susa Simmonds air conditioning in the bedroom and throughout the house if possible.  . Avoid going out in dry windy days - especially early morning. . Pollen counts are highest between 5 - 10 AM and on dry, hot and windy days.  . Save outside activities for late afternoon or  after a heavy rain, when pollen levels are lower.  . Avoid mowing of grass if you have grass pollen allergy. Marland Kitchen Be aware that pollen can also be transported indoors on people and pets.  . Dry your clothes in an automatic dryer rather than hanging them outside where they might collect pollen.  . Rinse hair and eyes before bedtime. Mold Control . Mold and fungi can grow on a variety of surfaces provided certain temperature and moisture conditions exist.  . Outdoor molds grow on plants, decaying vegetation and soil. The major outdoor mold, Alternaria and Cladosporium, are found in very high numbers during hot and dry conditions. Generally, a late summer - fall peak is seen for common outdoor fungal spores. Rain will temporarily lower outdoor mold spore count, but counts rise rapidly when the rainy period ends. . The most important indoor molds are Aspergillus and Penicillium. Dark, humid and poorly ventilated basements are ideal sites for mold growth. The next most common sites of mold growth are the bathroom and the kitchen. Outdoor (Seasonal) Mold Control . Use air conditioning and keep windows closed. . Avoid exposure to decaying vegetation. Marland Kitchen Avoid leaf raking. . Avoid grain handling. . Consider wearing a face mask if working in moldy areas.  Indoor (Perennial) Mold Control  . Maintain humidity below 50%. . Get rid of mold growth on hard surfaces with water, detergent and, if necessary, 5% bleach (do not mix with other cleaners). Then dry the area completely. If mold covers an area more than 10 square feet, consider hiring an indoor environmental professional. . For clothing, washing with soap and water is best. If moldy items cannot be cleaned and dried, throw them away. . Remove sources e.g. contaminated carpets. . Repair and seal leaking roofs or pipes. Using dehumidifiers in damp basements may be helpful, but empty the water and clean units regularly to prevent mildew from forming. All rooms,  especially basements, bathrooms and kitchens, require ventilation and cleaning to deter mold and mildew growth. Avoid carpeting on concrete or damp floors, and storing items in damp areas.

## 2020-08-02 ENCOUNTER — Encounter (HOSPITAL_BASED_OUTPATIENT_CLINIC_OR_DEPARTMENT_OTHER): Payer: Self-pay

## 2020-08-02 ENCOUNTER — Other Ambulatory Visit: Payer: Self-pay

## 2020-08-02 ENCOUNTER — Encounter: Payer: Self-pay | Admitting: Allergy

## 2020-08-02 ENCOUNTER — Emergency Department (HOSPITAL_BASED_OUTPATIENT_CLINIC_OR_DEPARTMENT_OTHER)
Admission: EM | Admit: 2020-08-02 | Discharge: 2020-08-02 | Disposition: A | Discharge status: Home / Self Care | Payer: Medicaid Other | Attending: Emergency Medicine | Admitting: Emergency Medicine

## 2020-08-02 DIAGNOSIS — M79622 Pain in left upper arm: Secondary | ICD-10-CM | POA: Diagnosis present

## 2020-08-02 DIAGNOSIS — L02412 Cutaneous abscess of left axilla: Secondary | ICD-10-CM | POA: Insufficient documentation

## 2020-08-02 DIAGNOSIS — J45909 Unspecified asthma, uncomplicated: Secondary | ICD-10-CM | POA: Diagnosis not present

## 2020-08-02 DIAGNOSIS — E039 Hypothyroidism, unspecified: Secondary | ICD-10-CM | POA: Diagnosis not present

## 2020-08-02 MED ORDER — CEPHALEXIN 500 MG PO CAPS: 500.0000 mg | ORAL_CAPSULE | Freq: Four times a day (QID) | ORAL | 0 refills | Status: DC

## 2020-08-02 MED ORDER — LIDOCAINE HCL (PF) 1 % IJ SOLN
5.0000 mL | Freq: Once | INTRAMUSCULAR | Status: AC
  Administered 2020-08-02: 5 mL
  Filled 2020-08-02: qty 5

## 2020-08-02 MED ORDER — IBUPROFEN 400 MG PO TABS
600.0000 mg | ORAL_TABLET | Freq: Once | ORAL | Status: AC
  Administered 2020-08-02: 600 mg via ORAL
  Filled 2020-08-02: qty 1

## 2020-08-02 NOTE — Discharge Instructions (Signed)
Please pick up antibiotic and take as prescribed Follow up with your PCP regarding ED visit and for recheck of symptoms Apply warm compresses to the area and take Ibuprofen and Tylenol as needed for pain Return to the ED for any worsening symptoms

## 2020-08-02 NOTE — Assessment & Plan Note (Signed)
History of asthma as a child and used to have an albuterol nebulizer. Denies any respiratory symptoms.  Today's spirometry was normal.  Monitor symptoms.

## 2020-08-02 NOTE — ED Triage Notes (Signed)
Pt c/o left axilla abscess x 1 week-NAD-steady gait

## 2020-08-02 NOTE — Assessment & Plan Note (Addendum)
Symptoms of itchy throat/eyes/skin and coughing since June 2021. Persistent throughout the day and sometimes wakes her up at night. Tried Zyrtec, Benadryl, azelastine and Flonase with minimal benefit. 3 sinus infections since the summer. No previous allergy/ENT evaluation.  Today's skin testing showed: Positive to one mold and one tree. Borderline to weed. Negative to other allergens including hamster.   Start environmental control measures as below  -discussed with patient that the above allergens do not explain her current symptoms. Will get blood work instead of intradermals to further investigate as she is getting blood work already.  Recommend referral to ENT but patient would like to try the below regimen first.  May use over the counter antihistamines such as Zyrtec (cetirizine) 10mg  daily. May take twice a day during flares.  May use azelastine nasal spray 1-2 sprays per nostril twice a day as needed for runny nose/drainage.  Nasal saline spray (i.e., Simply Saline) or nasal saline lavage (i.e., NeilMed) is recommended as needed and prior to medicated nasal sprays.  May use olopatadine eye drops 0.2% once a day as needed for itchy/watery eyes.  If it causes too much dryness then stop and let us know.

## 2020-08-02 NOTE — Assessment & Plan Note (Signed)
Currently avoiding pineapple, eggs and straight dairy. This seemed to cause nausea, vomiting and abdominal pains. Tolerates lactose free dairy.  Today's skin testing was borderline positive pineapple and negative to other common foods. Will double check via blood work.  Avoid foods that bother you - pineapple, eggs.  Okay with lactose free dairy -most likely is lactose intolerant.   For mild symptoms you can take over the counter antihistamines such as Benadryl and monitor symptoms closely. If symptoms worsen or if you have severe symptoms including breathing issues, throat closure, significant swelling, whole body hives, severe diarrhea and vomiting, lightheadedness then seek immediate medical care.

## 2020-08-02 NOTE — ED Provider Notes (Signed)
[facility] EMERGENCY DEPARTMENT Provider Note   CSN: [id number] Arrival date & time: 08/02/20  1915     History Chief Complaint  Patient presents with  . Abscess    Debbie [name] is a 40 y.o. female with PMHx Lupus and Hypothyroidism who presents to the ED today with complaint of area of redness, swelling, and pain to left axilla x 1 week. Pt reports similar symptoms to right axilla 1 month ago and seen in the ED for same requiring incision and drainage. Pt reports she noticed multiple small bumps to her left axilla last week and then 1 of the bumps started growing and becoming very painful. Pt attempted to pop the area however reports only blood. She was unable to sleep last night due to the amount of pain prompting her to come to the ED today. Pt denies any trauma to the area recently - she gets her arm pits waxed. The last time she got this done was prior to the first abscess 1 month ago. She reports no issues in the past with waxing. She does mention she was waxed by a new girl this time; does not think they've changed wax recently. Denies fevers, chills, or any other associated symptoms.   The history is provided by the patient and medical records.       Past Medical History:  Diagnosis Date  . Anemia   . Chest pain   . Hypothyroidism   . Lupus ([place])   . Migraine   . Migraines   . Systemic lupus ([place])   . Thyroid disease   . UTI (urinary tract infection)   . Vaginal delivery 2002, 2009, 2015  . Varicose veins    Left > than right    Patient Active Problem List   Diagnosis Date Noted  . Rash and other nonspecific skin eruption 08/01/2020  . History of asthma 08/01/2020  . Adverse food reaction 08/01/2020  . Abdominal pain 07/09/2019  . Postoperative pain 07/07/2019  . Postoperative state 06/23/2019  . SLE (systemic lupus erythematosus) ([place]) 05/19/2019  . Obesity (BMI 30.0-34.9) 05/19/2019  . Depression, recurrent ([place]) 05/19/2019  . Hypothyroidism  05/19/2019  . Active labor 03/22/2014  . Varicose veins of leg with pain 02/16/2013  . Laryngitis, acute 04/28/2012  . Vomiting 04/28/2012  . Fever 04/28/2012  . Subconjunctival hemorrhage sm prob from vomiting 04/28/2012  . Anemia 01/27/2012  . Migraine 02/18/2011  . Other allergic rhinitis 02/18/2011  . Hypothyroid 02/18/2011    Past Surgical History:  Procedure Laterality Date  . APPENDECTOMY    . CYSTOSCOPY N/A 06/23/2019   Procedure: CYSTOSCOPY;  Surgeon: Bobbye Charleston, MD;  Location: [facility];  Service: Gynecology;  Laterality: N/A;  . DILATION AND EVACUATION N/A 04/27/2013   Procedure: DILATATION AND EVACUATION;  Surgeon: Daria Pastures, MD;  Location: [facility];  Service: Gynecology;  Laterality: N/A;  . LAPAROSCOPIC TUBAL LIGATION Bilateral 06/08/2014   Procedure: LAPAROSCOPIC TUBAL LIGATION;  Surgeon: Daria Pastures, MD;  Location: [facility];  Service: Gynecology;  Laterality: Bilateral;  . ROBOTIC ASSISTED LAPAROSCOPIC HYSTERECTOMY AND SALPINGECTOMY Bilateral 06/23/2019   Procedure: XI ROBOTIC ASSISTED LAPAROSCOPIC HYSTERECTOMY AND SALPINGECTOMY;  Surgeon: Bobbye Charleston, MD;  Location: [facility];  Service: Gynecology;  Laterality: Bilateral;  TS RNFA confirmed on 06/08/19 CS  . TONSILLECTOMY  2001  . TONSILLECTOMY    . TONSILLECTOMY AND ADENOIDECTOMY    . UMBILICAL HERNIA REPAIR  03/2017   x2     OB  History    Gravida  6   Para  3   Term  3   Preterm      AB  3   Living  3     SAB  2   TAB  1   Ectopic      Multiple      Live Births  3           Family History  Problem Relation Age of Onset  . Hypertension Mother   . Hyperlipidemia Father   . Diabetes Father   . Hypertension Father   . Arthritis Neg Hx        family  . Migraines Neg Hx     Social History   Tobacco Use  . Smoking status: Never Smoker  . Smokeless tobacco: Never Used  Vaping Use  . Vaping Use: Never used  Substance Use  Topics  . Alcohol use: Yes    Comment: SOCIALLY  . Drug use: No    Home Medications Prior to Admission medications   Medication Sig Start Date End Date Taking? Authorizing Provider  Belimumab (BENLYSTA IV) Inject into the vein. Every 4 weeks.    [provider]  buPROPion (WELLBUTRIN) 100 MG tablet Take by mouth at bedtime. 07/12/20   [provider]  cephALEXin (KEFLEX) 500 MG capsule Take 1 capsule (500 mg total) by mouth 4 (four) times daily. 08/02/20   Eustaquio Maize, PA-C  Cholecalciferol (VITAMIN D PO) Take 1,000 Units by mouth daily.     [provider]  ibuprofen (ADVIL) 800 MG tablet Take 1 tablet (800 mg total) by mouth every 8 (eight) hours. 06/24/19   Bobbye Charleston, MD  naproxen (NAPROSYN) 500 MG tablet Take 1 tablet (500 mg total) by mouth 2 (two) times daily. 02/14/20   Horton, Barbette Hair, MD  Olopatadine HCl 0.2 % SOLN Apply 1 drop to eye daily as needed (itchy/watery eyes). 08/01/20   Garnet Sierras, DO  ondansetron (ZOFRAN ODT) 4 MG disintegrating tablet Take 1 tablet (4 mg total) by mouth every 8 (eight) hours as needed for nausea or vomiting. 02/01/20   Lomax, Amy, NP  Rimegepant Sulfate (NURTEC) 75 MG TBDP Take 75 mg by mouth daily as needed (take for abortive therapy of migraine, no more than 1 tablet in 24 hours or 10 per month). 02/01/20   Lomax, Amy, NP  SYNTHROID 150 MCG tablet TAKE 1 TABLET(150 MCG) BY MOUTH DAILY BEFORE BREAKFAST 01/12/20   Isaac Bliss, Rayford Halsted, MD  Topiramate ER (TROKENDI XR) 25 MG CP24 Take 1 capsule (25 mg total) by mouth at bedtime. 02/01/20   Lomax, Amy, NP    Allergies    Amoxicillin, Aspirin, Diflucan [fluconazole], Bactrim, and Sulfa antibiotics  Review of Systems   Review of Systems  Constitutional: Negative for chills and fever.  Skin:       + redness, pain, and swelling (abscess) to left axilla    Physical Exam Updated Vital Signs BP 118/69 (BP Location: Right Arm)   Pulse 96   Temp 98.2 F (36.8 C)  (Oral)   Resp 20   LMP 05/17/2019 (Approximate)   SpO2 100%   Physical Exam Vitals and nursing note reviewed.  Constitutional:      Appearance: She is not ill-appearing.  HENT:     Head: Normocephalic and atraumatic.  Eyes:     Conjunctiva/sclera: Conjunctivae normal.  Cardiovascular:     Rate and Rhythm: Normal rate and regular rhythm.  Pulses: Normal pulses.  Pulmonary:     Effort: Pulmonary effort is normal.     Breath sounds: Normal breath sounds. No wheezing, rhonchi or rales.  Skin:    General: Skin is warm and dry.     Coloration: Skin is not jaundiced.     Comments: 2 x 1 cm of induration and erythema to left axilla with multiple small pimples   Neurological:     Mental Status: She is alert.     ED Results / Procedures / Treatments   Labs (all labs ordered are listed, but only abnormal results are displayed) Labs Reviewed - No data to display  EKG None  Radiology No results found.  Procedures .Marland KitchenIncision and Drainage  Date/Time: 08/02/2020 8:33 PM Performed by: Eustaquio Maize, PA-C Authorized by: Eustaquio Maize, PA-C   Consent:    Consent obtained:  Verbal   Consent given by:  Patient   Risks discussed:  Bleeding, incomplete drainage, infection and pain Location:    Type:  Abscess   Size:  2 x 1   Location:  Upper extremity (Left axilla) Pre-procedure details:    Skin preparation:  Betadine Anesthesia (see MAR for exact dosages):    Anesthesia method:  Local infiltration   Local anesthetic:  Lidocaine 1% w/o epi Procedure type:    Complexity:  Simple Procedure details:    Incision types:  Stab incision   Scalpel blade:  11   Wound management:  Probed and deloculated and irrigated with saline   Drainage:  Bloody and purulent   Drainage amount:  Scant   Wound treatment:  Wound left open   Packing materials:  None Post-procedure details:    Patient tolerance of procedure:  Tolerated well, no immediate complications   (including critical  care time)  Medications Ordered in ED Medications  lidocaine (PF) (XYLOCAINE) 1 % injection 5 mL (5 mLs Infiltration Given 08/02/20 2004)  ibuprofen (ADVIL) tablet 600 mg (600 mg Oral Given 08/02/20 2005)    ED Course  I have reviewed the triage vital signs and the nursing notes.  Pertinent labs & imaging results that were available during my care of the patient were reviewed by me and considered in my medical decision making (see chart for details).    MDM Rules/Calculators/A&P                          39 year old female presenting to the ED today with abscess to left axilla x 1 week. History of similar symptoms to right axilla 1 month ago requiring I&D. No fevers or chills at home. No other complaints. On arrival to the ED VSS. Pt appears to be slightly uncomfortable secondary to pain. She has multiple pimples to left axilla with larger 2 x 1 cm area of induration and erythema. No obvious fluctuance appreciated at this time however will attempt I&D today for symptomatic relief. Pt provided with Ibuprofen prior.   I&D performed with scant amount of blood and purulent material. Will plan to provide antibiotics as well given the small amount of pus. Will have patient continue warm compresses to the area and follow up with her PCP. She is in agreement with plan and stable for discharge home.   This note was prepared using Dragon voice recognition software and may include unintentional dictation errors due to the inherent limitations of voice recognition software.  Final Clinical Impression(s) / ED Diagnoses Final diagnoses:  Abscess of left axilla  Rx / DC Orders ED Discharge Orders         Ordered    cephALEXin (KEFLEX) 500 MG capsule  4 times daily        08/02/20 2037           Discharge Instructions     Please pick up antibiotic and take as prescribed Follow up with your PCP regarding ED visit and for recheck of symptoms Apply warm compresses to the area and take  Ibuprofen and Tylenol as needed for pain Return to the ED for any worsening symptoms       Eustaquio Maize, PA-C 08/02/20 2038    Blanchie Dessert, MD 08/02/20 2232

## 2020-08-02 NOTE — Assessment & Plan Note (Addendum)
Patient has a history of lupus and follows with rheumatology. Rash started about 1 month ago and mainly occurs in her arm and face. Describes it as itchy, red and flat. No triggers noted. Tried Zyrtec and Benadryl with minimal benefit. Systemic steroids seem to help.  Start proper skin care as below.  Keep track of outbreaks and take pictures.  Not sure what's causing these rashes at this time.   Get blood work to rule out other etiologies.  Check with rheumatology to make sure it's not patient's lupus flaring up.

## 2020-08-05 ENCOUNTER — Encounter: Payer: Self-pay | Admitting: Allergy

## 2020-08-07 MED ORDER — HYDROXYZINE PAMOATE 25 MG PO CAPS: 25.0000 mg | ORAL_CAPSULE | Freq: Every evening | ORAL | 1 refills | Status: AC | PRN

## 2020-08-09 LAB — ALLERGENS W/TOTAL IGE AREA 2

## 2020-08-09 LAB — ALPHA-GAL PANEL
Alpha Gal IgE*: <0.1 kU/L (ref <0.10)
Beef (Bos spp) IgE: <0.1 kU/L (ref <0.35)
Class Interpretation: 0
Class Interpretation: 0
Class Interpretation: 0
Lamb/Mutton (Ovis spp) IgE: <0.1 kU/L (ref <0.35)
Pork (Sus spp) IgE: <0.1 kU/L (ref <0.35)

## 2020-08-09 LAB — ALLERGEN, PINEAPPLE, F210: Pineapple IgE: 0.2 kU/L — AB

## 2020-08-09 LAB — IGE EGG WHITE W/COMPONENT RFLX: F001-IgE Egg White: <0.1 kU/L

## 2020-08-09 LAB — TRYPTASE: Tryptase: 9.9 ug/L (ref 2.2–13.2)

## 2020-08-09 LAB — CHRONIC URTICARIA: cu index: 5.4 (ref <10)

## 2020-08-26 ENCOUNTER — Encounter: Payer: Self-pay | Admitting: Allergy

## 2020-08-28 MED ORDER — CROMOLYN SODIUM 4 % OP SOLN: 1.0000 [drp] | Freq: Four times a day (QID) | OPHTHALMIC | 1 refills | Status: DC | PRN

## 2020-09-22 ENCOUNTER — Other Ambulatory Visit: Payer: Self-pay

## 2020-09-22 ENCOUNTER — Emergency Department (HOSPITAL_BASED_OUTPATIENT_CLINIC_OR_DEPARTMENT_OTHER)
Admission: EM | Admit: 2020-09-22 | Discharge: 2020-09-22 | Discharge status: Against Medical Advice | Payer: Medicaid Other

## 2020-10-19 ENCOUNTER — Telehealth: Payer: Self-pay | Admitting: *Deleted

## 2020-10-19 NOTE — Telephone Encounter (Signed)
Received fax from [employer] about this pt.  Considering LTD benefits.  Questionaire.  I donot see that we have done any p/w on this for pt previously. We see her for migraines.

## 2020-10-19 NOTE — Telephone Encounter (Signed)
I wrote on form.  To MR.

## 2020-10-19 NOTE — Telephone Encounter (Signed)
I would not have any specific limitations or restrictions to warrant long term disability. I will be happy to provide last office notes if needed.

## 2020-11-02 ENCOUNTER — Ambulatory Visit: Payer: Medicaid Other | Admitting: Allergy

## 2020-11-02 NOTE — Progress Notes (Deleted)
Follow Up Note  RE: Debbie [name] MRN: [id number] DOB: 07-28-1981 Date of Office Visit: 11/02/2020  Referring provider: Margarito Courser, MD Primary care provider: Margarito Courser, MD  Chief Complaint: No chief complaint on file.  History of Present Illness: I had the pleasure of seeing Debbie [name] for a follow up visit at the [facility] on 11/02/2020. She is a 40 y.o. female, who is being followed for allergic rhinitis, rash, history of asthma and adverse food reaction. Her previous allergy office visit was on 08/01/2020 with Dr. Maudie Mercury. Today is a regular follow up visit.  No indoor/outdoor allergies found. Egg bloodwok was negative. Pineapple slightly positive. Chronic urticaria index (checks for autoantibodies that trigger mast cells), tryptase (checks for mast cell issues) and alpha gal (checks for red meat allergy) were all normal which is great.   Given these results, no cause found for your rashes/itching. Continue plan as per the last visit.   We can discuss about reintroducing egg and pineapple back into the diet at next visit. Meanwhile continue to avoid for now.   Other allergic rhinitis Symptoms of itchy throat/eyes/skin and coughing since June 2021. Persistent throughout the day and sometimes wakes her up at night. Tried Zyrtec, Benadryl, azelastine and Flonase with minimal benefit. 3 sinus infections since the summer. No previous allergy/ENT evaluation.  Today's skin testing showed: Positive to one mold and one tree. Borderline to weed. Negative to other allergens including hamster.   Start environmental control measures as below  -discussed with patient that the above allergens do not explain her current symptoms. Will get blood work instead of intradermals to further investigate as she is getting blood work already.  Recommend referral to ENT but patient would like to try the below regimen first.  May use over the counter antihistamines such as Zyrtec  (cetirizine) 10mg  daily. May take twice a day during flares.  May use azelastine nasal spray 1-2 sprays per nostril twice a day as needed for runny nose/drainage.  Nasal saline spray (i.e., Simply Saline) or nasal saline lavage (i.e., NeilMed) is recommended as needed and prior to medicated nasal sprays.  May use olopatadine eye drops 0.2% once a day as needed for itchy/watery eyes. ? If it causes too much dryness then stop and let us know.   Rash and other nonspecific skin eruption Patient has a history of lupus and follows with rheumatology. Rash started about 1 month ago and mainly occurs in her arm and face. Describes it as itchy, red and flat. No triggers noted. Tried Zyrtec and Benadryl with minimal benefit. Systemic steroids seem to help.  Start proper skin care as below.  Keep track of outbreaks and take pictures.  Not sure what's causing these rashes at this time.   Get blood work to rule out other etiologies.  Check with rheumatology to make sure it's not patient's lupus flaring up.  History of asthma History of asthma as a child and used to have an albuterol nebulizer. Denies any respiratory symptoms.  Today's spirometry was normal.  Monitor symptoms.  Adverse food reaction Currently avoiding pineapple, eggs and straight dairy. This seemed to cause nausea, vomiting and abdominal pains. Tolerates lactose free dairy.  Today's skin testing was borderline positive pineapple and negative to other common foods. Will double check via blood work.  Avoid foods that bother you - pineapple, eggs.  Okay with lactose free dairy -most likely is lactose intolerant.   For mild symptoms you can take over  the counter antihistamines such as Benadryl and monitor symptoms closely. If symptoms worsen or if you have severe symptoms including breathing issues, throat closure, significant swelling, whole body hives, severe diarrhea and vomiting, lightheadedness then seek immediate medical  care.  Return in about 3 months (around 11/01/2020).   Assessment and Plan: Debbie [name] is a 40 y.o. female with: No problem-specific Assessment & Plan notes found for this encounter.  No follow-ups on file.  No orders of the defined types were placed in this encounter.  Lab Orders  No laboratory test(s) ordered today    Diagnostics: Spirometry:  Tracings reviewed. Her effort: {Blank single:19197::"Good reproducible efforts.","It was hard to get consistent efforts and there is a question as to whether this reflects a maximal maneuver.","Poor effort, data can not be interpreted."} FVC: ***L FEV1: ***L, ***% predicted FEV1/FVC ratio: ***% Interpretation: {Blank single:19197::"Spirometry consistent with mild obstructive disease","Spirometry consistent with moderate obstructive disease","Spirometry consistent with severe obstructive disease","Spirometry consistent with possible restrictive disease","Spirometry consistent with mixed obstructive and restrictive disease","Spirometry uninterpretable due to technique","Spirometry consistent with normal pattern","No overt abnormalities noted given today's efforts"}.  Please see scanned spirometry results for details.  Skin Testing: {Blank single:19197::"Select foods","Environmental allergy panel","Environmental allergy panel and select foods","Food allergy panel","None","Deferred due to recent antihistamines use"}. Positive test to: ***. Negative test to: ***.  Results discussed with patient/family.   Medication List:  Current Outpatient Medications  Medication Sig Dispense Refill  . Belimumab (BENLYSTA IV) Inject into the vein. Every 4 weeks.    Marland Kitchen buPROPion (WELLBUTRIN) 100 MG tablet Take by mouth at bedtime.    . cephALEXin (KEFLEX) 500 MG capsule Take 1 capsule (500 mg total) by mouth 4 (four) times daily. 20 capsule 0  . Cholecalciferol (VITAMIN D PO) Take 1,000 Units by mouth daily.     . cromolyn (OPTICROM) 4 % ophthalmic solution Place 1  drop into both eyes 4 (four) times daily as needed (itchy/watery eyes). 10 mL 1  . hydrOXYzine (VISTARIL) 25 MG capsule Take 1 capsule (25 mg total) by mouth at bedtime as needed for itching. Take 1 hour before bedtime. 30 capsule 1  . ibuprofen (ADVIL) 800 MG tablet Take 1 tablet (800 mg total) by mouth every 8 (eight) hours. 30 tablet 0  . naproxen (NAPROSYN) 500 MG tablet Take 1 tablet (500 mg total) by mouth 2 (two) times daily. 30 tablet 0  . ondansetron (ZOFRAN ODT) 4 MG disintegrating tablet Take 1 tablet (4 mg total) by mouth every 8 (eight) hours as needed for nausea or vomiting. 20 tablet 0  . Rimegepant Sulfate (NURTEC) 75 MG TBDP Take 75 mg by mouth daily as needed (take for abortive therapy of migraine, no more than 1 tablet in 24 hours or 10 per month). 10 tablet 11  . SYNTHROID 150 MCG tablet TAKE 1 TABLET(150 MCG) BY MOUTH DAILY BEFORE BREAKFAST 30 tablet 0  . Topiramate ER (TROKENDI XR) 25 MG CP24 Take 1 capsule (25 mg total) by mouth at bedtime. 60 capsule 5   No current facility-administered medications for this visit.   Allergies: Allergies  Allergen Reactions  . Amoxicillin Rash  . Aspirin Hives    Pt states she can take Ibuprofen  . Diflucan [Fluconazole] Hives  . Bactrim Swelling and Rash  . Sulfa Antibiotics Swelling and Rash   I reviewed her past medical history, social history, family history, and environmental history and no significant changes have been reported from her previous visit.  Review of Systems  Constitutional: Negative for appetite change,  chills, fever and unexpected weight change.  HENT: Negative for congestion and rhinorrhea.   Eyes: Positive for itching.  Respiratory: Positive for cough. Negative for chest tightness, shortness of breath and wheezing.   Cardiovascular: Negative for chest pain.  Gastrointestinal: Negative for abdominal pain.  Genitourinary: Negative for difficulty urinating.  Skin: Positive for rash.  Allergic/Immunologic:  Positive for environmental allergies.  Neurological: Negative for headaches.   Objective: LMP 05/17/2019 (Approximate)  There is no height or weight on file to calculate BMI. Physical Exam Vitals and nursing note reviewed.  Constitutional:      Appearance: Normal appearance. She is well-developed.  HENT:     Head: Normocephalic and atraumatic.     Right Ear: External ear normal.     Left Ear: External ear normal.     Nose: Nose normal.     Mouth/Throat:     Mouth: Mucous membranes are moist.     Pharynx: Oropharynx is clear.  Eyes:     Conjunctiva/sclera: Conjunctivae normal.  Cardiovascular:     Rate and Rhythm: Normal rate and regular rhythm.     Heart sounds: Normal heart sounds. No murmur heard. No friction rub. No gallop.   Pulmonary:     Effort: Pulmonary effort is normal.     Breath sounds: Normal breath sounds. No wheezing, rhonchi or rales.  Abdominal:     Palpations: Abdomen is soft.  Musculoskeletal:     Cervical back: Neck supple.  Skin:    General: Skin is warm.     Findings: No rash.  Neurological:     Mental Status: She is alert and oriented to person, place, and time.  Psychiatric:        Behavior: Behavior normal.    Previous notes and tests were reviewed. The plan was reviewed with the patient/family, and all questions/concerned were addressed.  It was my pleasure to see Debbie [name] today and participate in her care. Please feel free to contact me with any questions or concerns.  Sincerely,  Rexene Alberts, DO Allergy & Immunology  [facility] office: [place] office: [phone]

## 2020-11-03 ENCOUNTER — Emergency Department (HOSPITAL_BASED_OUTPATIENT_CLINIC_OR_DEPARTMENT_OTHER): Payer: Medicaid Other

## 2020-11-03 ENCOUNTER — Encounter (HOSPITAL_BASED_OUTPATIENT_CLINIC_OR_DEPARTMENT_OTHER): Payer: Self-pay | Admitting: Emergency Medicine

## 2020-11-03 ENCOUNTER — Emergency Department (HOSPITAL_BASED_OUTPATIENT_CLINIC_OR_DEPARTMENT_OTHER)
Admission: EM | Admit: 2020-11-03 | Discharge: 2020-11-03 | Disposition: A | Discharge status: Home / Self Care | Payer: Medicaid Other | Attending: Emergency Medicine | Admitting: Emergency Medicine

## 2020-11-03 ENCOUNTER — Other Ambulatory Visit: Payer: Self-pay

## 2020-11-03 DIAGNOSIS — E039 Hypothyroidism, unspecified: Secondary | ICD-10-CM | POA: Diagnosis not present

## 2020-11-03 DIAGNOSIS — R42 Dizziness and giddiness: Secondary | ICD-10-CM | POA: Diagnosis present

## 2020-11-03 DIAGNOSIS — M25532 Pain in left wrist: Secondary | ICD-10-CM | POA: Diagnosis not present

## 2020-11-03 DIAGNOSIS — R110 Nausea: Secondary | ICD-10-CM | POA: Insufficient documentation

## 2020-11-03 DIAGNOSIS — M25572 Pain in left ankle and joints of left foot: Secondary | ICD-10-CM | POA: Insufficient documentation

## 2020-11-03 DIAGNOSIS — M25571 Pain in right ankle and joints of right foot: Secondary | ICD-10-CM | POA: Diagnosis not present

## 2020-11-03 DIAGNOSIS — M25531 Pain in right wrist: Secondary | ICD-10-CM | POA: Insufficient documentation

## 2020-11-03 DIAGNOSIS — M2550 Pain in unspecified joint: Secondary | ICD-10-CM

## 2020-11-03 LAB — CBC WITH DIFFERENTIAL/PLATELET
Abs Immature Granulocytes: 0.02 10*3/uL (ref 0.00–0.07)
Basophils Absolute: 0 10*3/uL (ref 0.0–0.1)
Basophils Relative: 1 %
Eosinophils Absolute: 0.2 10*3/uL (ref 0.0–0.5)
Eosinophils Relative: 4 %
HCT: 38 % (ref 36.0–46.0)
Hemoglobin: 12.4 g/dL (ref 12.0–15.0)
Immature Granulocytes: 0 %
Lymphocytes Relative: 35 %
Lymphs Abs: 1.9 10*3/uL (ref 0.7–4.0)
MCH: 22.8 pg — ABNORMAL LOW (ref 26.0–34.0)
MCHC: 32.6 g/dL (ref 30.0–36.0)
MCV: 69.7 fL — ABNORMAL LOW (ref 80.0–100.0)
Monocytes Absolute: 0.6 10*3/uL (ref 0.1–1.0)
Monocytes Relative: 11 %
Neutro Abs: 2.7 10*3/uL (ref 1.7–7.7)
Neutrophils Relative %: 49 %
Platelets: 283 10*3/uL (ref 150–400)
RBC: 5.45 MIL/uL — ABNORMAL HIGH (ref 3.87–5.11)
RDW: 16.1 % — ABNORMAL HIGH (ref 11.5–15.5)
Smear Review: NORMAL
WBC: 5.5 10*3/uL (ref 4.0–10.5)
nRBC: 0 % (ref 0.0–0.2)

## 2020-11-03 LAB — COMPREHENSIVE METABOLIC PANEL
ALT: 13 U/L (ref 0–44)
AST: 19 U/L (ref 15–41)
Albumin: 4.2 g/dL (ref 3.5–5.0)
Alkaline Phosphatase: 40 U/L (ref 38–126)
Anion gap: 7 (ref 5–15)
BUN: 14 mg/dL (ref 6–20)
CO2: 26 mmol/L (ref 22–32)
Calcium: 8.9 mg/dL (ref 8.9–10.3)
Chloride: 104 mmol/L (ref 98–111)
Creatinine, Ser: 0.79 mg/dL (ref 0.44–1.00)
GFR, Estimated: >60 mL/min (ref >60)
Glucose, Bld: 98 mg/dL (ref 70–99)
Potassium: 4 mmol/L (ref 3.5–5.1)
Sodium: 137 mmol/L (ref 135–145)
Total Bilirubin: 0.6 mg/dL (ref 0.3–1.2)
Total Protein: 6.6 g/dL (ref 6.5–8.1)

## 2020-11-03 LAB — URINALYSIS, ROUTINE W REFLEX MICROSCOPIC
Bilirubin Urine: NEGATIVE
Glucose, UA: NEGATIVE mg/dL
Hgb urine dipstick: NEGATIVE
Ketones, ur: NEGATIVE mg/dL
Leukocytes,Ua: NEGATIVE
Nitrite: NEGATIVE
Protein, ur: NEGATIVE mg/dL
Specific Gravity, Urine: 1.01 (ref 1.005–1.030)
pH: 6.5 (ref 5.0–8.0)

## 2020-11-03 LAB — LIPASE, BLOOD: Lipase: 37 U/L (ref 11–51)

## 2020-11-03 MED ORDER — SODIUM CHLORIDE 0.9 % IV BOLUS
500.0000 mL | Freq: Once | INTRAVENOUS | Status: AC
  Administered 2020-11-03: 500 mL via INTRAVENOUS

## 2020-11-03 MED ORDER — ONDANSETRON HCL 4 MG/2ML IJ SOLN
4.0000 mg | Freq: Once | INTRAMUSCULAR | Status: AC
  Administered 2020-11-03: 4 mg via INTRAVENOUS
  Filled 2020-11-03: qty 2

## 2020-11-03 MED ORDER — PREDNISONE 10 MG PO TABS: ORAL_TABLET | ORAL | 0 refills | Status: DC

## 2020-11-03 MED ORDER — MORPHINE SULFATE (PF) 4 MG/ML IV SOLN
4.0000 mg | Freq: Once | INTRAVENOUS | Status: AC
  Administered 2020-11-03: 4 mg via INTRAVENOUS
  Filled 2020-11-03: qty 1

## 2020-11-03 NOTE — ED Provider Notes (Signed)
Emergency Department Provider Note   I have reviewed the triage vital signs and the nursing notes.   HISTORY  Chief Complaint Lupus flair   HPI Debbie [name] is a 40 y.o. female with PMH of Lupus followed at [facility] with monthly Benlysta presents to the ED with worsening polyarthralgia with some nausea and lightheadedness. Symptoms have been worse over the past 3 days. She missed her January infusion of Benlysta she states due to being on abx for a rash that has since resolved.  Patient has had polyarthralgia worse in her wrists and ankles which is migratory.  No significant joint redness, swelling, warmth.  She is not currently on steroid medications.  She is not taking DMARDS.  She has her infusion scheduled for February 8 at [facility].  She is not experiencing chest pain or shortness of breath.  She continues to make urine in normal amounts but states it has been more yellow than normal.  She came in today because she has been feeling increasingly nauseated with eating and felt lightheaded when driving and so stopped into the emergency department for evaluation. No syncope. No SOB.   Past Medical History:  Diagnosis Date  . Anemia   . Chest pain   . Hypothyroidism   . Lupus ([place])   . Migraine   . Migraines   . Systemic lupus ([place])   . Thyroid disease   . UTI (urinary tract infection)   . Vaginal delivery 2002, 2009, 2015  . Varicose veins    Left > than right    Patient Active Problem List   Diagnosis Date Noted  . Rash and other nonspecific skin eruption 08/01/2020  . History of asthma 08/01/2020  . Adverse food reaction 08/01/2020  . Abdominal pain 07/09/2019  . Postoperative pain 07/07/2019  . Postoperative state 06/23/2019  . SLE (systemic lupus erythematosus) ([place]) 05/19/2019  . Obesity (BMI 30.0-34.9) 05/19/2019  . Depression, recurrent ([place]) 05/19/2019  . Hypothyroidism 05/19/2019  . Active labor 03/22/2014  . Varicose veins of leg with pain 02/16/2013  . Laryngitis,  acute 04/28/2012  . Vomiting 04/28/2012  . Fever 04/28/2012  . Subconjunctival hemorrhage sm prob from vomiting 04/28/2012  . Anemia 01/27/2012  . Migraine 02/18/2011  . Other allergic rhinitis 02/18/2011  . Hypothyroid 02/18/2011    Past Surgical History:  Procedure Laterality Date  . APPENDECTOMY    . CYSTOSCOPY N/A 06/23/2019   Procedure: CYSTOSCOPY;  Surgeon: Bobbye Charleston, MD;  Location: [facility];  Service: Gynecology;  Laterality: N/A;  . DILATION AND EVACUATION N/A 04/27/2013   Procedure: DILATATION AND EVACUATION;  Surgeon: Daria Pastures, MD;  Location: [facility];  Service: Gynecology;  Laterality: N/A;  . LAPAROSCOPIC TUBAL LIGATION Bilateral 06/08/2014   Procedure: LAPAROSCOPIC TUBAL LIGATION;  Surgeon: Daria Pastures, MD;  Location: [facility];  Service: Gynecology;  Laterality: Bilateral;  . ROBOTIC ASSISTED LAPAROSCOPIC HYSTERECTOMY AND SALPINGECTOMY Bilateral 06/23/2019   Procedure: XI ROBOTIC ASSISTED LAPAROSCOPIC HYSTERECTOMY AND SALPINGECTOMY;  Surgeon: Bobbye Charleston, MD;  Location: [facility];  Service: Gynecology;  Laterality: Bilateral;  TS RNFA confirmed on 06/08/19 CS  . TONSILLECTOMY  2001  . TONSILLECTOMY    . TONSILLECTOMY AND ADENOIDECTOMY    . UMBILICAL HERNIA REPAIR  03/2017   x2    Allergies Amoxicillin, Aspirin, Diflucan [fluconazole], Bactrim, and Sulfa antibiotics  Family History  Problem Relation Age of Onset  . Hypertension Mother   . Hyperlipidemia Father   . Diabetes Father   .  Hypertension Father   . Arthritis Neg Hx        family  . Migraines Neg Hx     Social History Social History   Tobacco Use  . Smoking status: Never Smoker  . Smokeless tobacco: Never Used  Vaping Use  . Vaping Use: Never used  Substance Use Topics  . Alcohol use: Yes    Comment: SOCIALLY  . Drug use: No    Review of Systems  Constitutional: No fever/chills. Positive fatigue and lightheadedness.  Eyes: No visual  changes. ENT: No sore throat. Cardiovascular: Denies chest pain. Respiratory: Denies shortness of breath. Gastrointestinal: No abdominal pain.  No nausea, no vomiting.  No diarrhea.  No constipation. Genitourinary: Negative for dysuria. Musculoskeletal: Negative for back pain. Polyarthralgia.  Skin: Negative for rash. Neurological: Negative for headaches, focal weakness or numbness.  10-point ROS otherwise negative.  ____________________________________________   PHYSICAL EXAM:  VITAL SIGNS: ED Triage Vitals  Enc Vitals Group     BP 11/03/20 0918 119/80     Pulse Rate 11/03/20 0918 77     Resp 11/03/20 0918 18     Temp 11/03/20 0918 98.4 F (36.9 C)     Temp Source 11/03/20 0918 Oral     SpO2 11/03/20 0918 100 %     Weight 11/03/20 0919 180 lb (81.6 kg)     Height 11/03/20 0919 5\' 2"  (1.575 m)   Constitutional: Alert and oriented. Well appearing and in no acute distress. Eyes: Conjunctivae are normal. Head: Atraumatic. Nose: No congestion/rhinnorhea. Mouth/Throat: Mucous membranes are moist.  Neck: No stridor.  Cardiovascular: Normal rate, regular rhythm. Good peripheral circulation. Grossly normal heart sounds.   Respiratory: Normal respiratory effort.  No retractions. Lungs CTAB. Gastrointestinal: Soft and nontender. No distention.  Musculoskeletal: No lower extremity tenderness nor edema. No gross deformities of extremities. No findings to suspect septic joint.  Neurologic:  Normal speech and language. No gross focal neurologic deficits are appreciated.  Skin:  Skin is warm, dry and intact. No rash noted.   ____________________________________________   LABS (all labs ordered are listed, but only abnormal results are displayed)  Labs Reviewed  CBC WITH DIFFERENTIAL/PLATELET - Abnormal; Notable for the following components:      Result Value   RBC 5.45 (*)    MCV 69.7 (*)    MCH 22.8 (*)    RDW 16.1 (*)    All other components within normal limits   COMPREHENSIVE METABOLIC PANEL  LIPASE, BLOOD  URINALYSIS, ROUTINE W REFLEX MICROSCOPIC   ____________________________________________  EKG   EKG Interpretation  Date/Time:  Friday November 03 2020 09:53:01 EST Ventricular Rate:  71 PR Interval:    QRS Duration: 78 QT Interval:  379 QTC Calculation: 412 R Axis:   59 Text Interpretation: Sinus rhythm Low voltage, precordial leads No STEMI Confirmed by Nanda Quinton [phone]) on 11/03/2020 10:03:22 AM       ____________________________________________  RADIOLOGY  DG Chest Portable 1 View  Result Date: 11/03/2020 CLINICAL DATA:  Lupus EXAM: PORTABLE CHEST 1 VIEW COMPARISON:  December 02, 2019 FINDINGS: The cardiomediastinal silhouette is normal in contour. No pleural effusion. No pneumothorax. No acute pleuroparenchymal abnormality. Visualized abdomen is unremarkable. No acute osseous abnormality noted. IMPRESSION: No acute cardiopulmonary abnormality. Electronically Signed   By: Valentino Saxon MD   On: 11/03/2020 09:47    ____________________________________________   PROCEDURES  Procedure(s) performed:   Procedures  None  ____________________________________________   INITIAL IMPRESSION / ASSESSMENT AND PLAN / ED COURSE  Pertinent  labs & imaging results that were available during my care of the patient were reviewed by me and considered in my medical decision making (see chart for details).   Patient presents the emergency department with polyarthralgia, nausea, lightheadedness.  She has normal vitals without fever, tachycardia, hypoxemia.  Has been feeling some lightheadedness which I suspect is due to mild to moderate dehydration.  Plan for IV fluids and pain medications.  Will follow labs including kidney function obtain chest x-ray. No findings on exam or vitals/history to suspect PE.   Patient's lab work is reassuring.  Kidney function is normal.  Chest x-ray is clear.  No hypoxemia or chest pain.  Patient feeling  much improved after IV fluids and pain medications.  Called to discuss the case with the patient's rheumatologist Dr. Aldona Bar at [facility].  Reviewed the case and lab work by phone.  Discussed the possible role for steroids here.  Dr. Aldona Bar advises 20 mg prednisone with taper over 7 days of total treatment which was ordered. She has her MAB infusion scheduled for next week and will keep that appointment.  Discussed ED return precautions along with follow-up plan in detail.  Patient in agreement with plan to discharge.  ____________________________________________  FINAL CLINICAL IMPRESSION(S) / ED DIAGNOSES  Final diagnoses:  Polyarthralgia  Episodic lightheadedness     MEDICATIONS GIVEN DURING THIS VISIT:  Medications  sodium chloride 0.9 % bolus 500 mL (0 mLs Intravenous Stopped 11/03/20 1030)  morphine 4 MG/ML injection 4 mg (4 mg Intravenous Given 11/03/20 0947)  ondansetron (ZOFRAN) injection 4 mg (4 mg Intravenous Given 11/03/20 0946)     NEW OUTPATIENT MEDICATIONS STARTED DURING THIS VISIT:  Discharge Medication List as of 11/03/2020 10:59 AM    START taking these medications   Details  predniSONE (DELTASONE) 10 MG tablet 20 mg PO x 2 days, 15 mg PO x 2 days, 10 mg PO x 2 days, and 5 mg x 1 day, Normal        Note:  This document was prepared using Dragon voice recognition software and may include unintentional dictation errors.  Nanda Quinton, MD, [facility] Emergency [facility]    Karmelo Bass, Wonda Olds, MD 11/03/20 [phone]

## 2020-11-03 NOTE — ED Notes (Signed)
[facility]

## 2020-11-03 NOTE — ED Notes (Signed)
Called [facility]

## 2020-11-03 NOTE — Discharge Instructions (Signed)
You were seen in the emergency room today with joint pain and lightheadedness. Your lab work here is reassuring. I spoke with your rheumatologist and we are starting you on a 1 week course of steroids with taper. Please take the medication as directed. Please keep your appointment on Tuesday for your infusion. Return to the emergency department any new or suddenly worsening symptoms.

## 2020-11-03 NOTE — ED Triage Notes (Signed)
Reports having lupus flair for the last 3 days.  Reports joint pain and nausea.  Has not had infusion since December usually goes monthly.

## 2020-11-28 DIAGNOSIS — J329 Chronic sinusitis, unspecified: Secondary | ICD-10-CM | POA: Insufficient documentation

## 2020-11-28 DIAGNOSIS — R0982 Postnasal drip: Secondary | ICD-10-CM | POA: Insufficient documentation

## 2021-03-27 ENCOUNTER — Encounter: Payer: Self-pay | Admitting: Allergy

## 2021-04-11 ENCOUNTER — Other Ambulatory Visit: Payer: Self-pay

## 2021-04-11 ENCOUNTER — Emergency Department (HOSPITAL_BASED_OUTPATIENT_CLINIC_OR_DEPARTMENT_OTHER)
Admission: EM | Admit: 2021-04-11 | Discharge: 2021-04-12 | Disposition: A | Discharge status: Home / Self Care | Payer: Medicare HMO | Attending: Emergency Medicine | Admitting: Emergency Medicine

## 2021-04-11 ENCOUNTER — Encounter (HOSPITAL_BASED_OUTPATIENT_CLINIC_OR_DEPARTMENT_OTHER): Payer: Self-pay | Admitting: *Deleted

## 2021-04-11 DIAGNOSIS — R1013 Epigastric pain: Secondary | ICD-10-CM | POA: Diagnosis not present

## 2021-04-11 DIAGNOSIS — R1033 Periumbilical pain: Secondary | ICD-10-CM | POA: Diagnosis present

## 2021-04-11 DIAGNOSIS — E039 Hypothyroidism, unspecified: Secondary | ICD-10-CM | POA: Insufficient documentation

## 2021-04-11 DIAGNOSIS — Z79899 Other long term (current) drug therapy: Secondary | ICD-10-CM | POA: Insufficient documentation

## 2021-04-11 DIAGNOSIS — R112 Nausea with vomiting, unspecified: Secondary | ICD-10-CM | POA: Diagnosis not present

## 2021-04-11 DIAGNOSIS — R1010 Upper abdominal pain, unspecified: Secondary | ICD-10-CM

## 2021-04-11 NOTE — ED Triage Notes (Signed)
Mid abdominal pain since 1900 after eating fish. Vomiting x 3. Denies diarrhea.

## 2021-04-12 LAB — CBC WITH DIFFERENTIAL/PLATELET
Abs Immature Granulocytes: 0.03 10*3/uL (ref 0.00–0.07)
Basophils Absolute: 0 10*3/uL (ref 0.0–0.1)
Basophils Relative: 0 %
Eosinophils Absolute: 0.2 10*3/uL (ref 0.0–0.5)
Eosinophils Relative: 2 %
HCT: 38.5 % (ref 36.0–46.0)
Hemoglobin: 12.4 g/dL (ref 12.0–15.0)
Immature Granulocytes: 0 %
Lymphocytes Relative: 22 %
Lymphs Abs: 2.1 10*3/uL (ref 0.7–4.0)
MCH: 22.5 pg — ABNORMAL LOW (ref 26.0–34.0)
MCHC: 32.2 g/dL (ref 30.0–36.0)
MCV: 70 fL — ABNORMAL LOW (ref 80.0–100.0)
Monocytes Absolute: 0.7 10*3/uL (ref 0.1–1.0)
Monocytes Relative: 7 %
Neutro Abs: 6.6 10*3/uL (ref 1.7–7.7)
Neutrophils Relative %: 69 %
Platelets: 320 10*3/uL (ref 150–400)
RBC: 5.5 MIL/uL — ABNORMAL HIGH (ref 3.87–5.11)
RDW: 16.2 % — ABNORMAL HIGH (ref 11.5–15.5)
WBC: 9.6 10*3/uL (ref 4.0–10.5)
nRBC: 0 % (ref 0.0–0.2)

## 2021-04-12 LAB — COMPREHENSIVE METABOLIC PANEL
ALT: 12 U/L (ref 0–44)
AST: 21 U/L (ref 15–41)
Albumin: 4.8 g/dL (ref 3.5–5.0)
Alkaline Phosphatase: 50 U/L (ref 38–126)
Anion gap: 9 (ref 5–15)
BUN: 11 mg/dL (ref 6–20)
CO2: 25 mmol/L (ref 22–32)
Calcium: 9.5 mg/dL (ref 8.9–10.3)
Chloride: 104 mmol/L (ref 98–111)
Creatinine, Ser: 0.82 mg/dL (ref 0.44–1.00)
GFR, Estimated: >60 mL/min (ref >60)
Glucose, Bld: 96 mg/dL (ref 70–99)
Potassium: 4.2 mmol/L (ref 3.5–5.1)
Sodium: 138 mmol/L (ref 135–145)
Total Bilirubin: 0.8 mg/dL (ref 0.3–1.2)
Total Protein: 7.6 g/dL (ref 6.5–8.1)

## 2021-04-12 LAB — LIPASE, BLOOD: Lipase: 30 U/L (ref 11–51)

## 2021-04-12 MED ORDER — ONDANSETRON HCL 4 MG PO TABS: 4.0000 mg | ORAL_TABLET | Freq: Four times a day (QID) | ORAL | 0 refills | Status: DC | PRN

## 2021-04-12 MED ORDER — ONDANSETRON HCL 4 MG/2ML IJ SOLN
4.0000 mg | Freq: Once | INTRAMUSCULAR | Status: AC
  Administered 2021-04-12: 4 mg via INTRAVENOUS
  Filled 2021-04-12: qty 2

## 2021-04-12 MED ORDER — ONDANSETRON HCL 4 MG/2ML IJ SOLN
4.0000 mg | Freq: Once | INTRAMUSCULAR | Status: AC
Start: 2021-04-12 — End: 2021-04-12
  Administered 2021-04-12: 4 mg via INTRAVENOUS
  Filled 2021-04-12: qty 2

## 2021-04-12 MED ORDER — SODIUM CHLORIDE 0.9 % IV BOLUS
1000.0000 mL | Freq: Once | INTRAVENOUS | Status: AC
  Administered 2021-04-12: 1000 mL via INTRAVENOUS

## 2021-04-12 MED ORDER — MORPHINE SULFATE (PF) 4 MG/ML IV SOLN
4.0000 mg | Freq: Once | INTRAVENOUS | Status: AC
  Administered 2021-04-12: 4 mg via INTRAVENOUS
  Filled 2021-04-12: qty 1

## 2021-04-12 NOTE — ED Notes (Signed)
Reports nausea and vomiting resolved at this time.

## 2021-04-12 NOTE — Discharge Instructions (Addendum)
Return if symptoms are getting worse. °

## 2021-04-12 NOTE — ED Provider Notes (Signed)
[facility] EMERGENCY DEPARTMENT Provider Note   CSN: [id number] Arrival date & time: 04/11/21  2339     History Chief Complaint  Patient presents with   Abdominal Pain    Debbie [name] is a 40 y.o. female.  The history is provided by the patient.  Abdominal Pain She has history of lupus and comes in complaining of periumbilical and epigastric pain which started about 1900.  Pain started after eating some fish.  There has been associated nausea and vomiting.  Pain does not improve with vomiting.  Pain does not radiate.  There has been no diarrhea.  She denies fever or chills.  Pain is rated at 8/10.  Nothing makes it better, nothing makes it worse.   Past Medical History:  Diagnosis Date   Anemia    Chest pain    Hypothyroidism    Lupus ([place])    Migraine    Migraines    Systemic lupus ([place])    Thyroid disease    UTI (urinary tract infection)    Vaginal delivery 2002, 2009, 2015   Varicose veins    Left > than right    Patient Active Problem List   Diagnosis Date Noted   Rash and other nonspecific skin eruption 08/01/2020   History of asthma 08/01/2020   Adverse food reaction 08/01/2020   Abdominal pain 07/09/2019   Postoperative pain 07/07/2019   Postoperative state 06/23/2019   SLE (systemic lupus erythematosus) ([place]) 05/19/2019   Obesity (BMI 30.0-34.9) 05/19/2019   Depression, recurrent ([place]) 05/19/2019   Hypothyroidism 05/19/2019   Active labor 03/22/2014   Varicose veins of leg with pain 02/16/2013   Laryngitis, acute 04/28/2012   Vomiting 04/28/2012   Fever 04/28/2012   Subconjunctival hemorrhage sm prob from vomiting 04/28/2012   Anemia 01/27/2012   Migraine 02/18/2011   Other allergic rhinitis 02/18/2011   Hypothyroid 02/18/2011    Past Surgical History:  Procedure Laterality Date   APPENDECTOMY     CYSTOSCOPY N/A 06/23/2019   Procedure: CYSTOSCOPY;  Surgeon: Bobbye Charleston, MD;  Location: [facility];  Service:  Gynecology;  Laterality: N/A;   DILATION AND EVACUATION N/A 04/27/2013   Procedure: DILATATION AND EVACUATION;  Surgeon: Daria Pastures, MD;  Location: [facility];  Service: Gynecology;  Laterality: N/A;   LAPAROSCOPIC TUBAL LIGATION Bilateral 06/08/2014   Procedure: LAPAROSCOPIC TUBAL LIGATION;  Surgeon: Daria Pastures, MD;  Location: [facility];  Service: Gynecology;  Laterality: Bilateral;   ROBOTIC ASSISTED LAPAROSCOPIC HYSTERECTOMY AND SALPINGECTOMY Bilateral 06/23/2019   Procedure: XI ROBOTIC ASSISTED LAPAROSCOPIC HYSTERECTOMY AND SALPINGECTOMY;  Surgeon: Bobbye Charleston, MD;  Location: [facility];  Service: Gynecology;  Laterality: Bilateral;  TS RNFA confirmed on 06/08/19 CS   TONSILLECTOMY  2001   TONSILLECTOMY     TONSILLECTOMY AND ADENOIDECTOMY     UMBILICAL HERNIA REPAIR  03/2017   x2     OB History     Gravida  6   Para  3   Term  3   Preterm      AB  3   Living  3      SAB  2   IAB  1   Ectopic      Multiple      Live Births  3           Family History  Problem Relation Age of Onset   Hypertension Mother    Hyperlipidemia Father    Diabetes Father    Hypertension Father  Arthritis Neg Hx        family   Migraines Neg Hx     Social History   Tobacco Use   Smoking status: Never   Smokeless tobacco: Never  Vaping Use   Vaping Use: Never used  Substance Use Topics   Alcohol use: Yes    Comment: SOCIALLY   Drug use: No    Home Medications Prior to Admission medications   Medication Sig Start Date End Date Taking? Authorizing Provider  Belimumab (BENLYSTA IV) Inject into the vein. Every 4 weeks.    [provider]  buPROPion (WELLBUTRIN) 100 MG tablet Take by mouth at bedtime. 07/12/20   [provider]  cephALEXin (KEFLEX) 500 MG capsule Take 1 capsule (500 mg total) by mouth 4 (four) times daily. 08/02/20   Eustaquio Maize, PA-C  Cholecalciferol (VITAMIN D PO) Take 1,000 Units by mouth daily.      [provider]  cromolyn (OPTICROM) 4 % ophthalmic solution Place 1 drop into both eyes 4 (four) times daily as needed (itchy/watery eyes). 08/28/20   Garnet Sierras, DO  hydrOXYzine (VISTARIL) 25 MG capsule Take 1 capsule (25 mg total) by mouth at bedtime as needed for itching. Take 1 hour before bedtime. 08/07/20   Garnet Sierras, DO  ibuprofen (ADVIL) 800 MG tablet Take 1 tablet (800 mg total) by mouth every 8 (eight) hours. 06/24/19   Bobbye Charleston, MD  naproxen (NAPROSYN) 500 MG tablet Take 1 tablet (500 mg total) by mouth 2 (two) times daily. 02/14/20   Horton, Barbette Hair, MD  ondansetron (ZOFRAN ODT) 4 MG disintegrating tablet Take 1 tablet (4 mg total) by mouth every 8 (eight) hours as needed for nausea or vomiting. 02/01/20   Lomax, Amy, NP  predniSONE (DELTASONE) 10 MG tablet 20 mg PO x 2 days, 15 mg PO x 2 days, 10 mg PO x 2 days, and 5 mg x 1 day 11/03/20   Long, Wonda Olds, MD  Rimegepant Sulfate (NURTEC) 75 MG TBDP Take 75 mg by mouth daily as needed (take for abortive therapy of migraine, no more than 1 tablet in 24 hours or 10 per month). 02/01/20   Lomax, Amy, NP  SYNTHROID 150 MCG tablet TAKE 1 TABLET(150 MCG) BY MOUTH DAILY BEFORE BREAKFAST 01/12/20   Isaac Bliss, Rayford Halsted, MD  Topiramate ER (TROKENDI XR) 25 MG CP24 Take 1 capsule (25 mg total) by mouth at bedtime. 02/01/20   Lomax, Amy, NP    Allergies    Amoxicillin, Aspirin, Diflucan [fluconazole], Bactrim, and Sulfa antibiotics  Review of Systems   Review of Systems  Gastrointestinal:  Positive for abdominal pain.  All other systems reviewed and are negative.  Physical Exam Updated Vital Signs BP 109/78 (BP Location: Left Arm)   Pulse 87   Temp 97.6 F (36.4 C) (Oral)   Resp 18   Ht 5\' 2"  (1.575 m)   Wt 83.9 kg   LMP 05/17/2019 (Approximate)   SpO2 100%   BMI 33.84 kg/m   Physical Exam Vitals and nursing note reviewed.  40 year old female, appears somewhat uncomfortable, but is in no acute distress. Vital  signs are normal. Oxygen saturation is 100%, which is normal. Head is normocephalic and atraumatic. PERRLA, EOMI. Oropharynx is clear. Neck is nontender and supple without adenopathy or JVD. Back is nontender and there is no CVA tenderness. Lungs are clear without rales, wheezes, or rhonchi. Chest is nontender. Heart has regular rate and rhythm without murmur. Abdomen  is soft, flat, with moderate epigastric tenderness.  There is no rebound or guarding.  There is no right upper quadrant tenderness and there is a negative Murphy sign.  There are no masses or hepatosplenomegaly and peristalsis is hypoactive. Extremities have no cyanosis or edema, full range of motion is present. Skin is warm and dry without rash. Neurologic: Mental status is normal, cranial nerves are intact, there are no motor or sensory deficits.  ED Results / Procedures / Treatments   Labs (all labs ordered are listed, but only abnormal results are displayed) Labs Reviewed  CBC WITH DIFFERENTIAL/PLATELET - Abnormal; Notable for the following components:      Result Value   RBC 5.50 (*)    MCV 70.0 (*)    MCH 22.5 (*)    RDW 16.2 (*)    All other components within normal limits  COMPREHENSIVE METABOLIC PANEL  LIPASE, BLOOD   Procedures Procedures   Medications Ordered in ED Medications  sodium chloride 0.9 % bolus 1,000 mL (has no administration in time range)  morphine 4 MG/ML injection 4 mg (has no administration in time range)  ondansetron (ZOFRAN) injection 4 mg (has no administration in time range)    ED Course  I have reviewed the triage vital signs and the nursing notes.  Pertinent labs & imaging results that were available during my care of the patient were reviewed by me and considered in my medical decision making (see chart for details).   MDM Rules/Calculators/A&P                         Epigastric pain with nausea and vomiting of uncertain cause.  Exam is benign, suspect gastritis.  She will be  given IV fluids, morphine, ondansetron and will check screening labs.  Old records are reviewed confirming outpatient management of lupus.  CT abdomen and pelvis in 2020 showed no evidence of cholelithiasis.  Labs are unremarkable.  Patient feels much better after above-noted treatment.  She is discharged with prescription for ondansetron.  Return precautions discussed.  Final Clinical Impression(s) / ED Diagnoses Final diagnoses:  Upper abdominal pain  Non-intractable vomiting with nausea, unspecified vomiting type    Rx / DC Orders ED Discharge Orders          Ordered    ondansetron (ZOFRAN) 4 MG tablet  Every 6 hours PRN        04/12/21 3016             Delora Fuel, MD 10/08/30 0153

## 2021-04-13 IMAGING — US US PELVIS COMPLETE TRANSABD/TRANSVAG W DUPLEX
1 series · 13 of 25 positions shown · non-contrast
Comparison: CT scan April 11, 2019

CLINICAL DATA: Right lower quadrant pain for 5 days. Abnormal CT
scan today.

EXAM:
TRANSABDOMINAL AND TRANSVAGINAL ULTRASOUND OF PELVIS
DOPPLER ULTRASOUND OF OVARIES
TECHNIQUE: Both transabdominal and transvaginal ultrasound examinations of the
pelvis were performed. Transabdominal technique was performed for
global imaging of the pelvis including uterus, ovaries, adnexal
regions, and pelvic cul-de-sac.
It was necessary to proceed with endovaginal exam following the
transabdominal exam to visualize the endometrium and ovaries. Color
and duplex Doppler ultrasound was utilized to evaluate blood flow to
the ovaries.

[Series 1: us pelvis complete transabd/transvag w duplex · 13 of 49 slices shown]
[im 1/49]
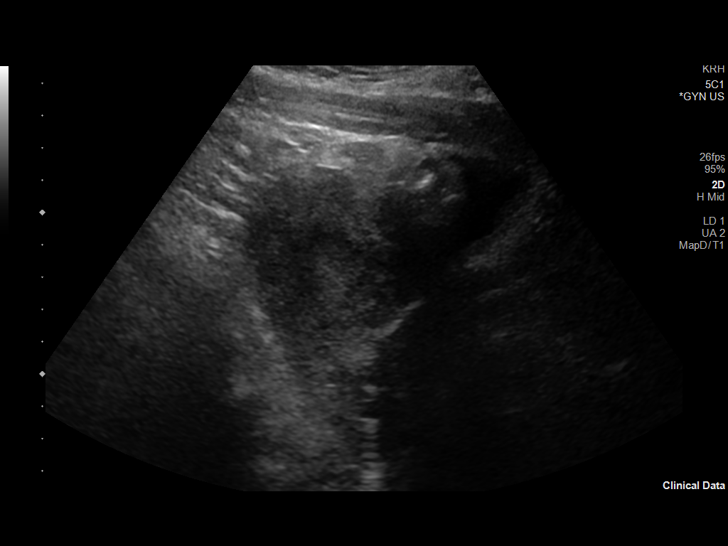
[im 5/49]
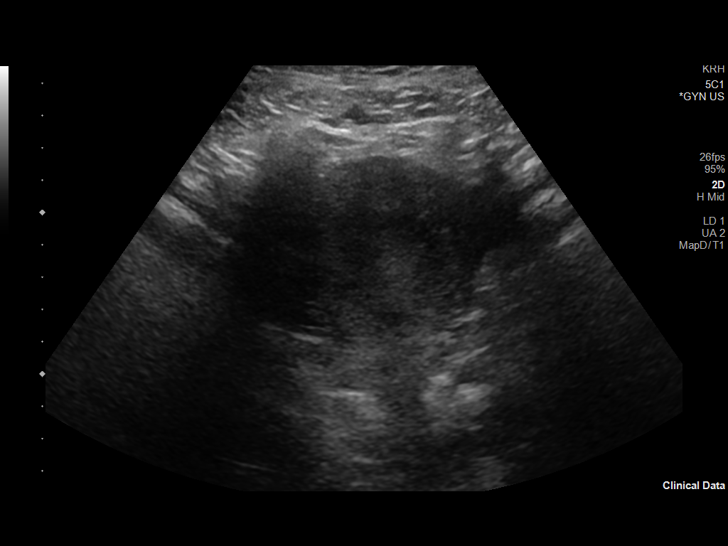
[im 9/49]
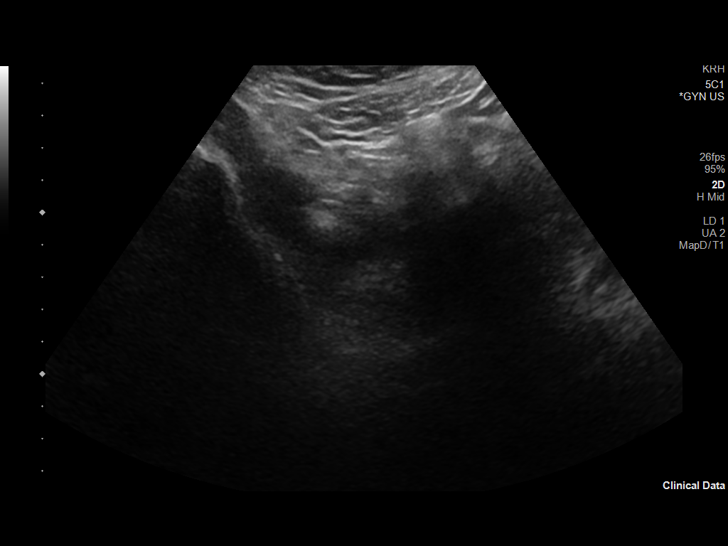
[im 13/49]
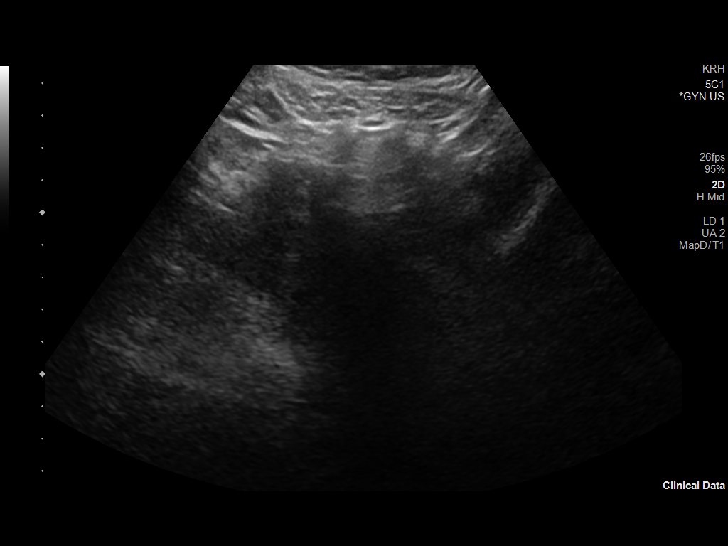
[im 17/49]
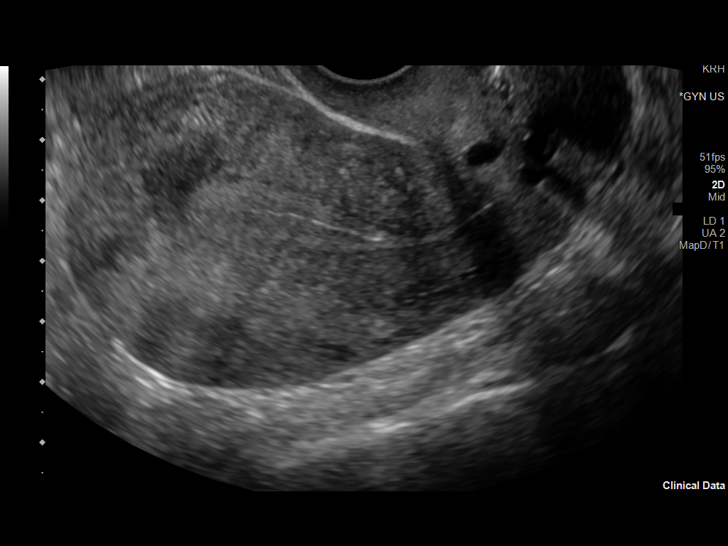
[im 21/49]
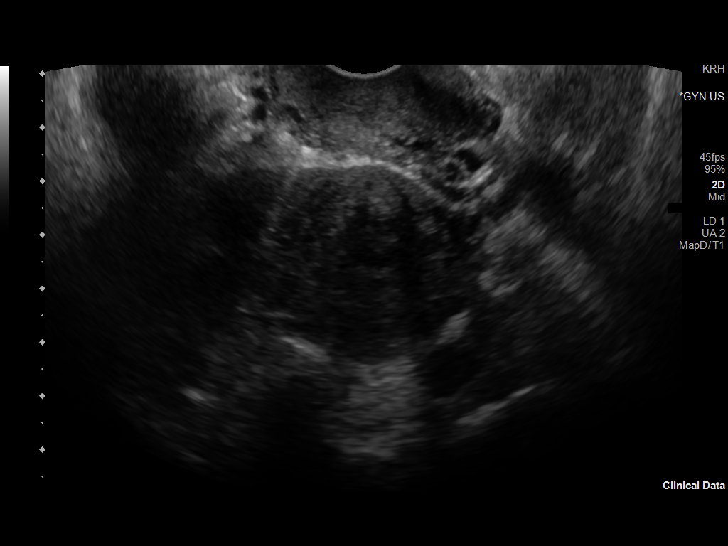
[im 25/49]
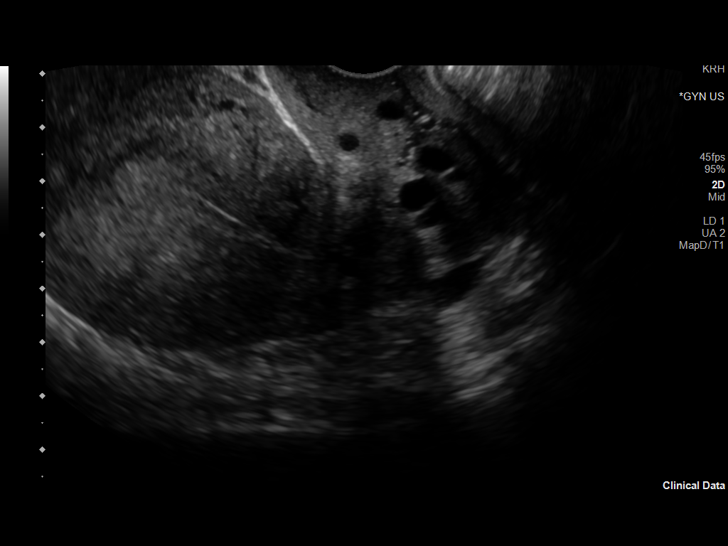
[im 29/49]
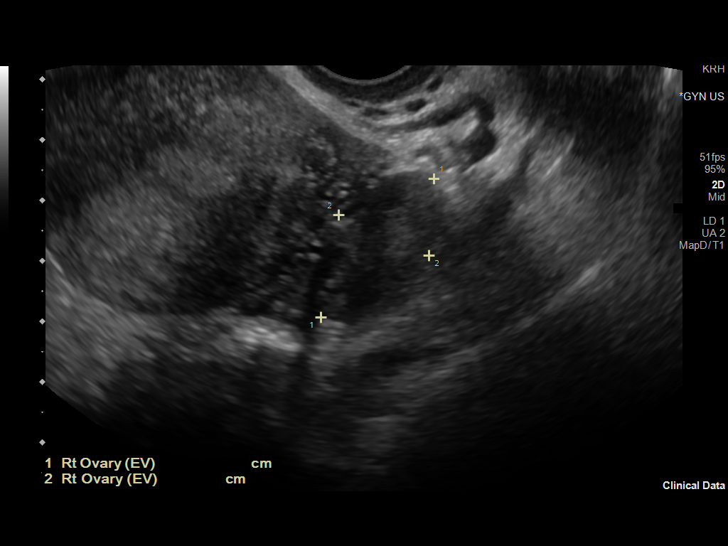
[im 33/49]
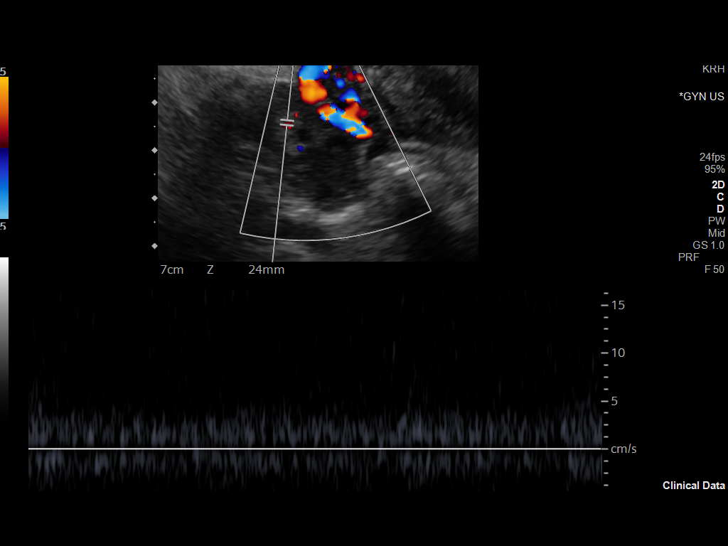
[im 37/49]
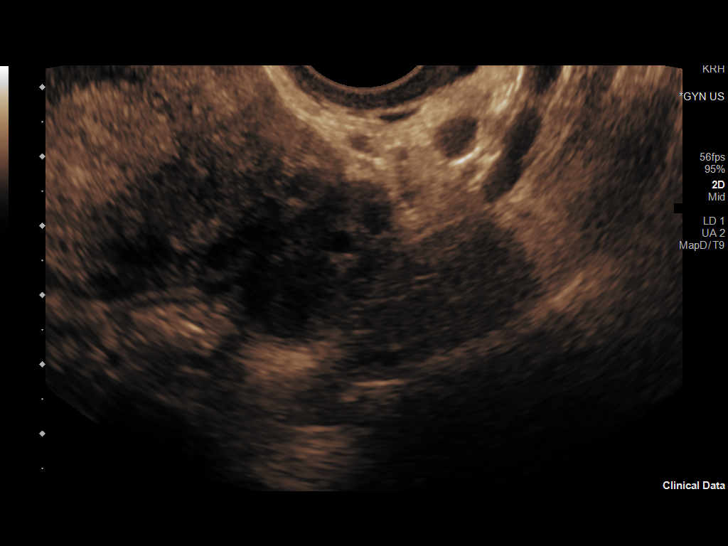
[im 41/49]
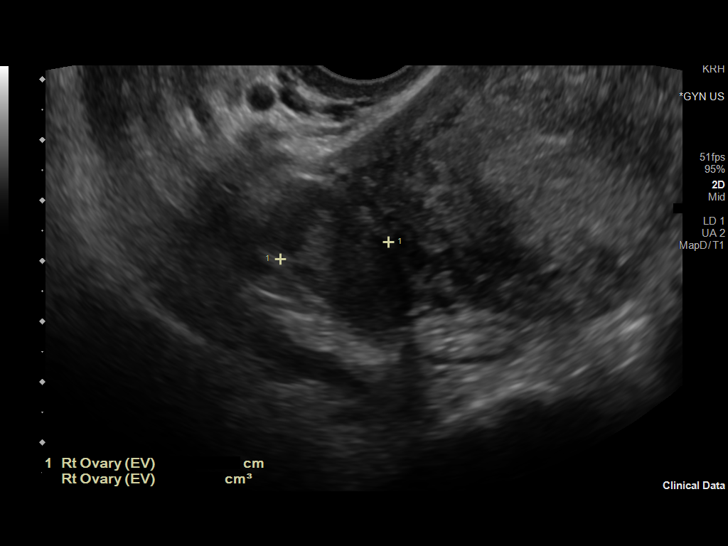
[im 45/49]
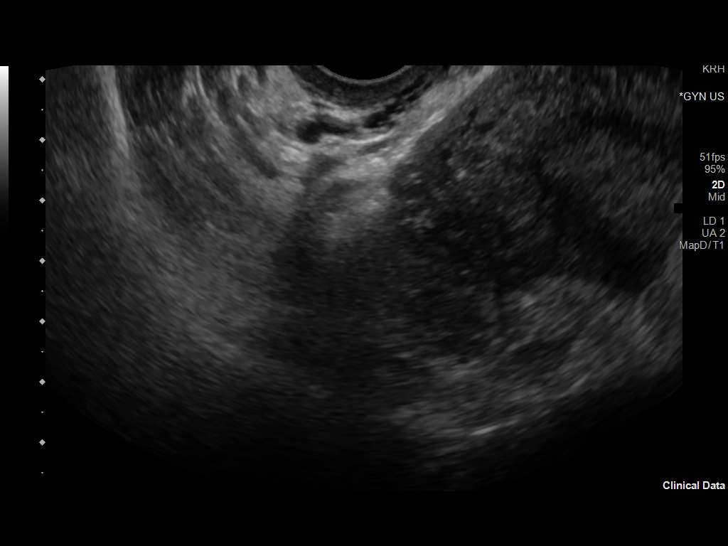
[im 49/49]
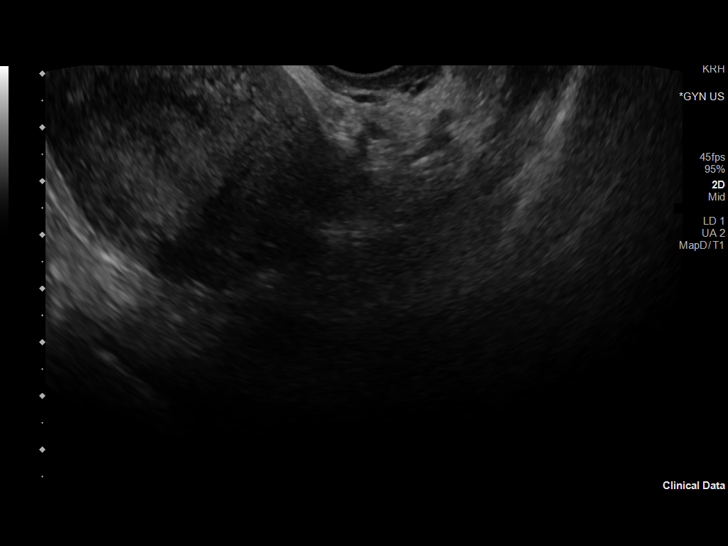

[13 of 25 positions shown; findings below may reference images not displayed]

FINDINGS: Uterus

Measurements: 9.1 x 5.1 x 6 cm = volume: 145 mL. Multiple nabothian
cysts in the cervix. No other abnormalities.

Endometrium

Thickness: 10 mm.  No focal abnormality visualized.

Right ovary

Measurements: 3 x 1.6 x 1.8 cm = volume: 4.6 mL. Contains a
hypoechoic mass measuring 12 x 10 x 10 mm, likely a collapsing
corpus luteum cyst seen on the CT from earlier today. No follow-up
necessary.

Left ovary

Not visualized.

Pulsed Doppler evaluation of the right ovary demonstrates normal
low-resistance arterial and venous waveforms.

Other findings

Trace fluid in the cul-de-sac is likely physiologic.
IMPRESSION: 1. A hypoechoic rounded region in the right ovary is consistent with
the collapsing follicle/corpus luteum cyst seen on today's CT scan.
No evidence of torsion or suspicious mass.
2. The left ovary is not visualized.
3. Trace physiologic fluid in the pelvis.
4. No other significant abnormalities. The endometrium is within
normal limits for age.

## 2021-04-13 IMAGING — CT CT ABDOMEN AND PELVIS WITH CONTRAST
2 of 4 series · 15 of 46 positions shown, 17 images · IV contrast (iopamidol)
Comparison: October 03, 2016

CLINICAL DATA: Abdominal pain, primarily right lower quadrant.

EXAM:
CT ABDOMEN AND PELVIS WITH CONTRAST
TECHNIQUE: Multidetector CT imaging of the abdomen and pelvis was performed
using the standard protocol following bolus administration of
intravenous contrast. Oral contrast was also administered.
CONTRAST:  100mL V0VXO6-UZZ IOPAMIDOL (V0VXO6-UZZ) INJECTION 61%

[Series 2: axial st · axial · 0.65mm/px · z∈[+899,+1254]mm · 12 of 85 slices shown, 14 images]
[im 7/85  soft-tissue]
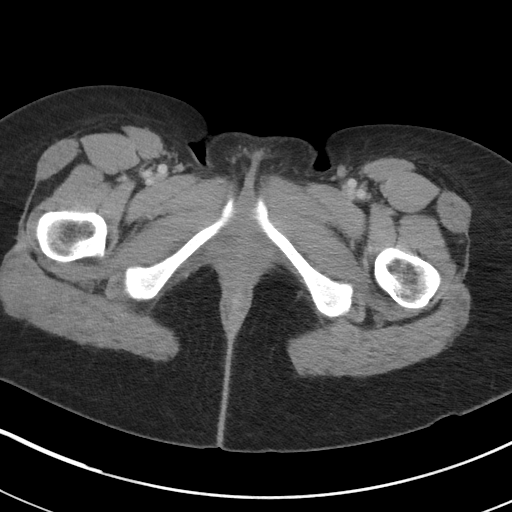
[im 7/85  bone]
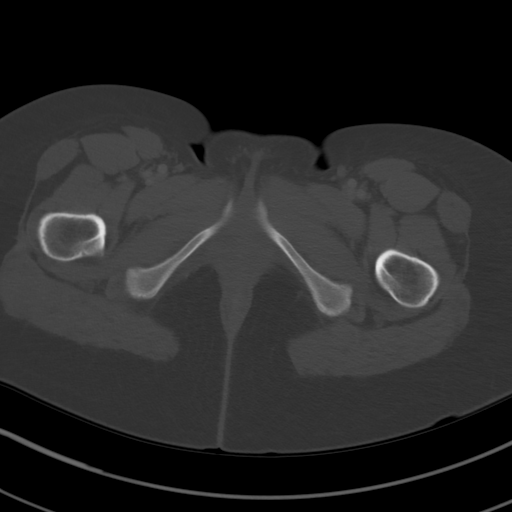
[im 13/85  soft-tissue]
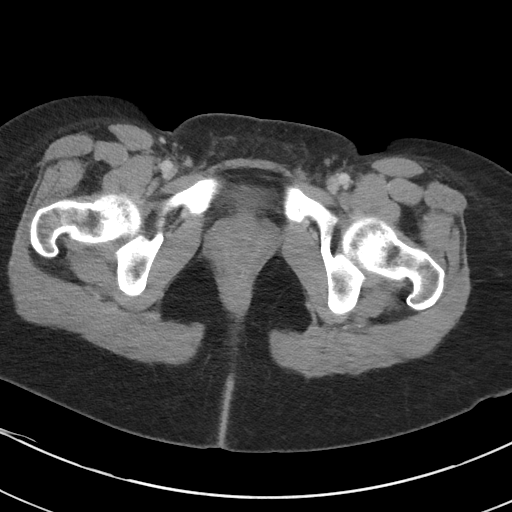
[im 20/85  soft-tissue]
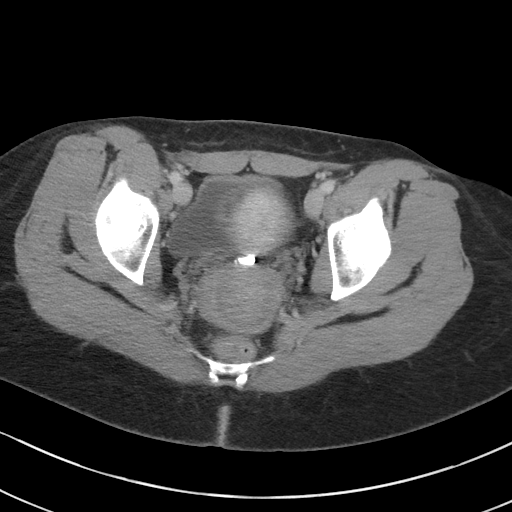
[im 26/85  soft-tissue]
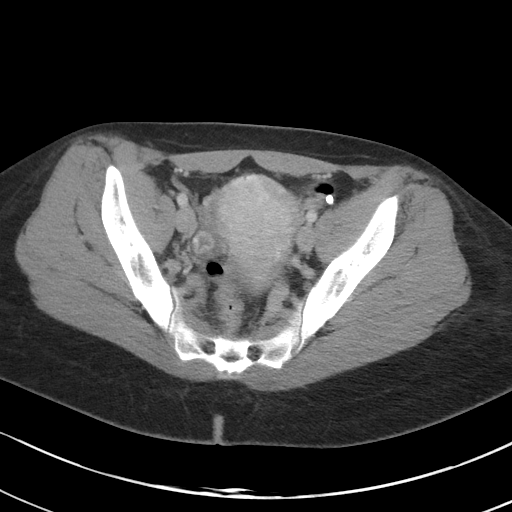
[im 33/85  soft-tissue]
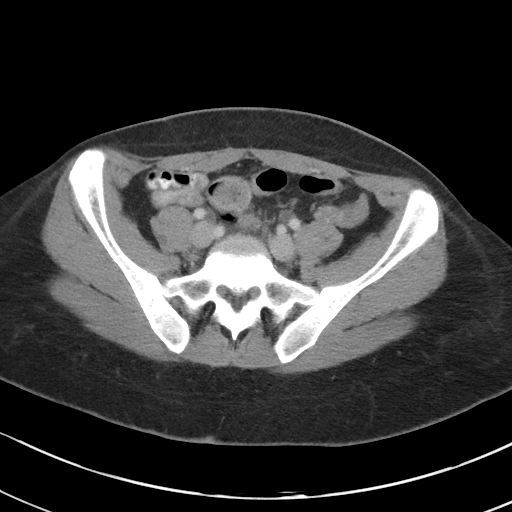
[im 39/85  soft-tissue]
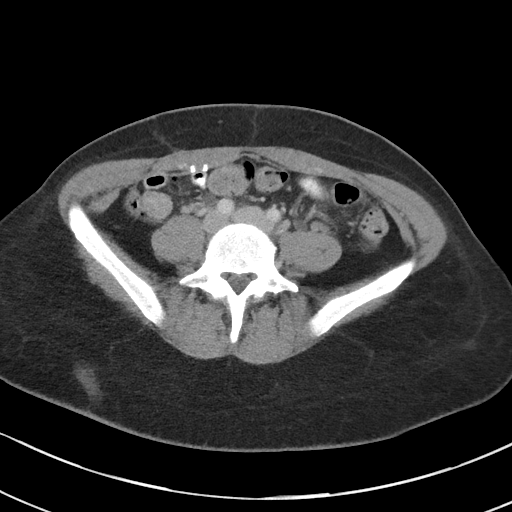
[im 46/85  soft-tissue]
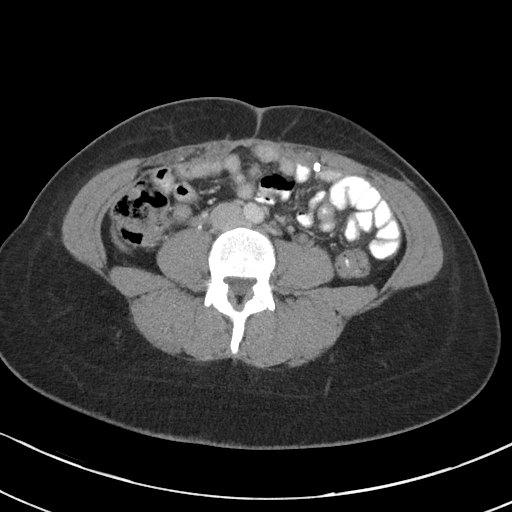
[im 52/85  soft-tissue]
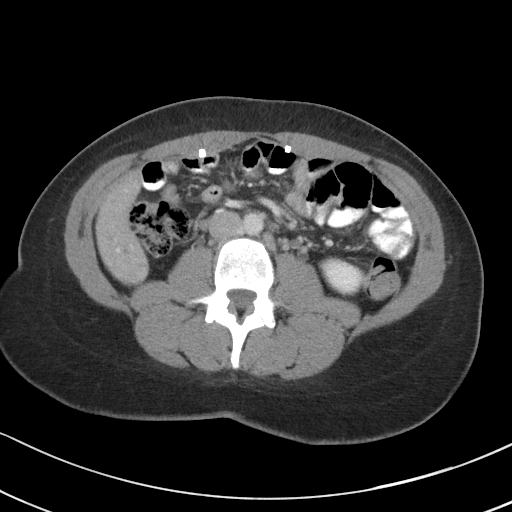
[im 59/85  soft-tissue]
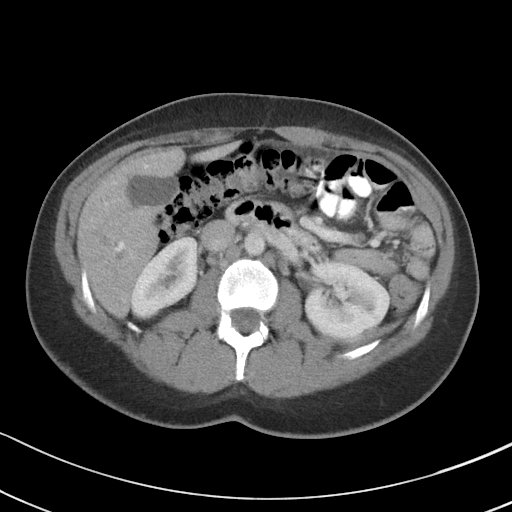
[im 59/85  bone]
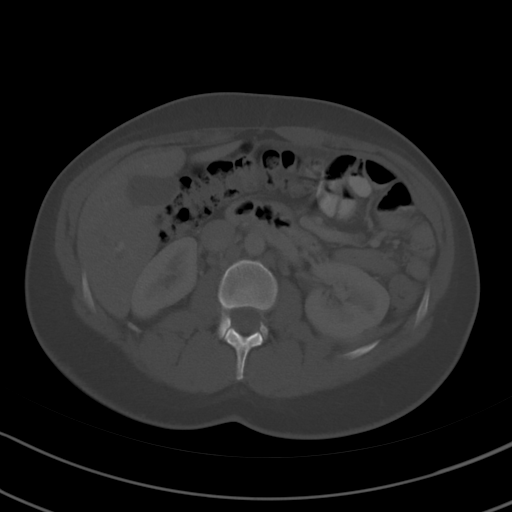
[im 65/85  soft-tissue]
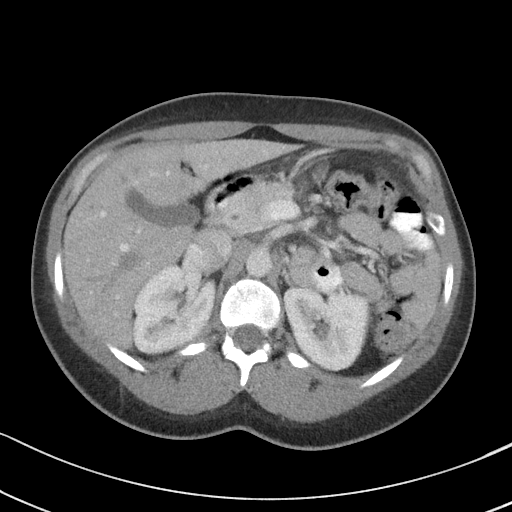
[im 72/85  soft-tissue]
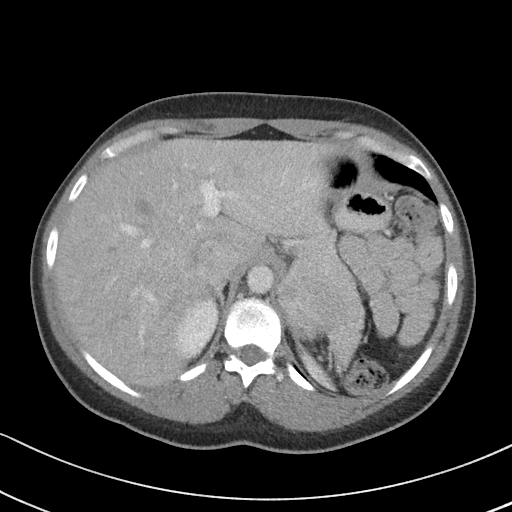
[im 78/85  soft-tissue]
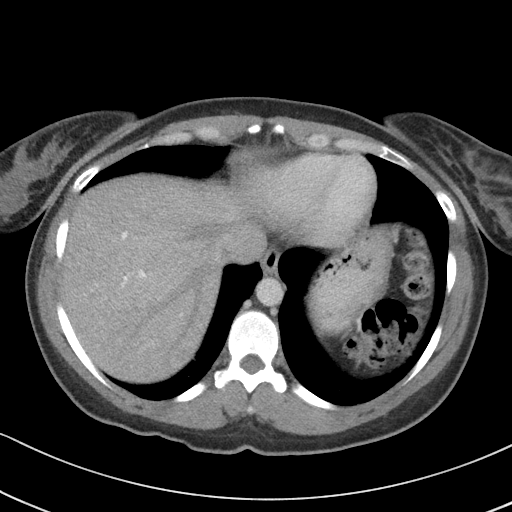

[Series 5: coronal st · coronal · 0.61mm/px · 3 of 85 slices shown]
[im 29/85  soft-tissue]
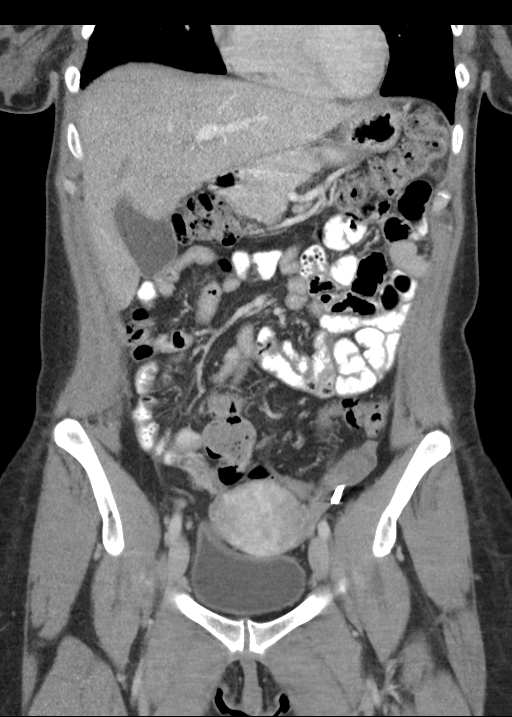
[im 38/85  soft-tissue]
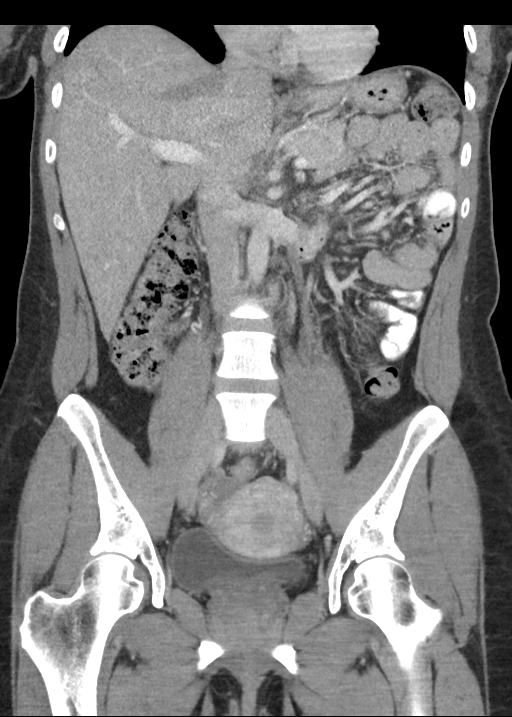
[im 47/85  soft-tissue]
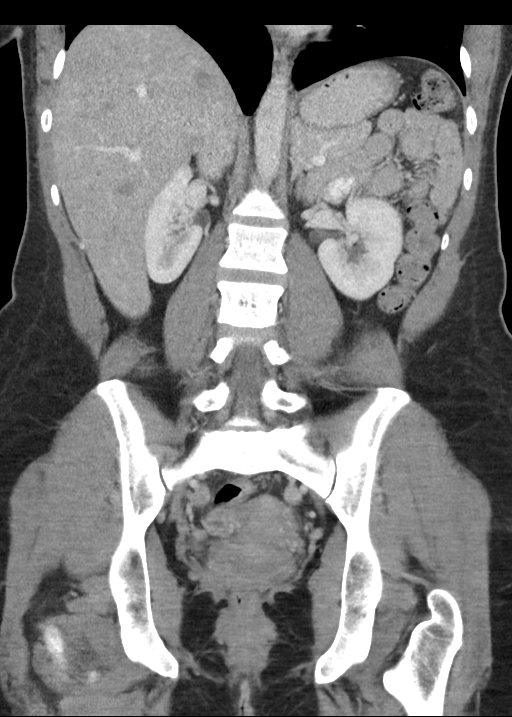

[15 of 46 positions shown; findings below may reference images not displayed]

FINDINGS: Lower chest: Lung bases are clear.

Hepatobiliary: No focal liver lesions are evident. The gallbladder
wall is not appreciably thickened. There is no biliary duct
dilatation.

Pancreas: No pancreatic mass or inflammatory focus.

Spleen: Spleen is again noted to be atrophic. No focal splenic
lesions are evident.

Adrenals/Urinary Tract: Adrenals appear unremarkable. There are two
8 mm cysts arising from the lateral mid to lower left kidney. There
is no appreciable hydronephrosis on either side. There is a
junctional parenchymal defect in each kidney, an anatomic variant.
There is no evident renal or ureteral calculus on either side.
Urinary bladder is midline with wall thickness within normal limits.

Stomach/Bowel: There is no appreciable bowel wall or mesenteric
thickening. There is no evident bowel obstruction. Terminal ileum
appears unremarkable. There is no evident free air or portal venous
air.

Vascular/Lymphatic: No abdominal aortic aneurysm. No vascular
lesions are evident. There is no appreciable adenopathy in the
abdomen or pelvis.

Reproductive: The uterus is anteverted. Endometrium appears
thickened at 19 mm. There is an apparent collapsed right ovarian
cyst measuring 1.6 x 1.6 cm. Tubal ligation clips are present.

Other: The appendix is absent. There is no periappendiceal region
inflammation. There is no abscess in the abdomen pelvis. Mesh is
seen along the anterior abdominal wall with apparent ventral hernia
repair. No hernia evident currently in this area.

Musculoskeletal: There are no blastic or lytic bone lesions. No
intramuscular lesions evident.
IMPRESSION: 1. Endometrium appears rather prominent. This finding may warrant
pelvic ultrasound for further assessment.

2.  Apparent collapsed right ovarian cyst.

3. No evident bowel obstruction. No abscess in the abdomen or
pelvis. Appendix absent. No periappendiceal region inflammatory
change.

4. No evident renal or ureteral calculus. No hydronephrosis. Urinary
bladder wall thickness is within normal limits.

5.  Postoperative change along the anterior abdominal wall.

## 2021-05-29 ENCOUNTER — Emergency Department (HOSPITAL_BASED_OUTPATIENT_CLINIC_OR_DEPARTMENT_OTHER): Payer: Medicare HMO

## 2021-05-29 ENCOUNTER — Emergency Department (HOSPITAL_BASED_OUTPATIENT_CLINIC_OR_DEPARTMENT_OTHER)
Admission: EM | Admit: 2021-05-29 | Discharge: 2021-05-29 | Disposition: A | Discharge status: Home / Self Care | Payer: Medicare HMO | Attending: Emergency Medicine | Admitting: Emergency Medicine

## 2021-05-29 ENCOUNTER — Other Ambulatory Visit: Payer: Self-pay

## 2021-05-29 ENCOUNTER — Encounter (HOSPITAL_BASED_OUTPATIENT_CLINIC_OR_DEPARTMENT_OTHER): Payer: Self-pay

## 2021-05-29 DIAGNOSIS — R079 Chest pain, unspecified: Secondary | ICD-10-CM | POA: Diagnosis not present

## 2021-05-29 DIAGNOSIS — Z79899 Other long term (current) drug therapy: Secondary | ICD-10-CM | POA: Diagnosis not present

## 2021-05-29 DIAGNOSIS — J45909 Unspecified asthma, uncomplicated: Secondary | ICD-10-CM | POA: Diagnosis not present

## 2021-05-29 DIAGNOSIS — E039 Hypothyroidism, unspecified: Secondary | ICD-10-CM | POA: Insufficient documentation

## 2021-05-29 DIAGNOSIS — R059 Cough, unspecified: Secondary | ICD-10-CM | POA: Diagnosis present

## 2021-05-29 DIAGNOSIS — Z20822 Contact with and (suspected) exposure to covid-19: Secondary | ICD-10-CM | POA: Insufficient documentation

## 2021-05-29 DIAGNOSIS — J0101 Acute recurrent maxillary sinusitis: Secondary | ICD-10-CM | POA: Insufficient documentation

## 2021-05-29 DIAGNOSIS — J0100 Acute maxillary sinusitis, unspecified: Secondary | ICD-10-CM

## 2021-05-29 LAB — CBC WITH DIFFERENTIAL/PLATELET
Abs Immature Granulocytes: 0.01 10*3/uL (ref 0.00–0.07)
Basophils Absolute: 0 10*3/uL (ref 0.0–0.1)
Basophils Relative: 1 %
Eosinophils Absolute: 0.2 10*3/uL (ref 0.0–0.5)
Eosinophils Relative: 3 %
HCT: 35.8 % — ABNORMAL LOW (ref 36.0–46.0)
Hemoglobin: 11.6 g/dL — ABNORMAL LOW (ref 12.0–15.0)
Immature Granulocytes: 0 %
Lymphocytes Relative: 29 %
Lymphs Abs: 1.6 10*3/uL (ref 0.7–4.0)
MCH: 23 pg — ABNORMAL LOW (ref 26.0–34.0)
MCHC: 32.4 g/dL (ref 30.0–36.0)
MCV: 70.9 fL — ABNORMAL LOW (ref 80.0–100.0)
Monocytes Absolute: 0.7 10*3/uL (ref 0.1–1.0)
Monocytes Relative: 12 %
Neutro Abs: 3.2 10*3/uL (ref 1.7–7.7)
Neutrophils Relative %: 55 %
Platelets: 283 10*3/uL (ref 150–400)
RBC: 5.05 MIL/uL (ref 3.87–5.11)
RDW: 16.1 % — ABNORMAL HIGH (ref 11.5–15.5)
WBC: 5.7 10*3/uL (ref 4.0–10.5)
nRBC: 0 % (ref 0.0–0.2)

## 2021-05-29 LAB — COMPREHENSIVE METABOLIC PANEL WITH GFR
ALT: 13 U/L (ref 0–44)
AST: 19 U/L (ref 15–41)
Albumin: 4.2 g/dL (ref 3.5–5.0)
Alkaline Phosphatase: 47 U/L (ref 38–126)
Anion gap: 9 (ref 5–15)
BUN: 18 mg/dL (ref 6–20)
CO2: 23 mmol/L (ref 22–32)
Calcium: 9 mg/dL (ref 8.9–10.3)
Chloride: 105 mmol/L (ref 98–111)
Creatinine, Ser: 0.8 mg/dL (ref 0.44–1.00)
GFR, Estimated: >60 mL/min
Glucose, Bld: 89 mg/dL (ref 70–99)
Potassium: 4 mmol/L (ref 3.5–5.1)
Sodium: 137 mmol/L (ref 135–145)
Total Bilirubin: 0.5 mg/dL (ref 0.3–1.2)
Total Protein: 6.9 g/dL (ref 6.5–8.1)

## 2021-05-29 LAB — RESP PANEL BY RT-PCR (FLU A&B, COVID) ARPGX2
Influenza A by PCR: NEGATIVE
Influenza B by PCR: NEGATIVE
SARS Coronavirus 2 by RT PCR: NEGATIVE

## 2021-05-29 LAB — TROPONIN I (HIGH SENSITIVITY): Troponin I (High Sensitivity): <2 ng/L (ref <18)

## 2021-05-29 MED ORDER — SODIUM CHLORIDE 0.9 % IV BOLUS
1000.0000 mL | Freq: Once | INTRAVENOUS | Status: AC
  Administered 2021-05-29: 1000 mL via INTRAVENOUS

## 2021-05-29 MED ORDER — DOXYCYCLINE HYCLATE 100 MG PO CAPS: 100.0000 mg | ORAL_CAPSULE | Freq: Two times a day (BID) | ORAL | 0 refills | Status: AC

## 2021-05-29 MED ORDER — ACETAMINOPHEN 325 MG PO TABS
650.0000 mg | ORAL_TABLET | Freq: Once | ORAL | Status: AC
  Administered 2021-05-29: 650 mg via ORAL
  Filled 2021-05-29: qty 2

## 2021-05-29 MED ORDER — KETOROLAC TROMETHAMINE 15 MG/ML IJ SOLN
15.0000 mg | Freq: Once | INTRAMUSCULAR | Status: AC
  Administered 2021-05-29: 15 mg via INTRAVENOUS
  Filled 2021-05-29: qty 1

## 2021-05-29 NOTE — ED Triage Notes (Addendum)
Pt c/o CP that lasted ~1.5 min ~10 min PTA-denies CP at present-also c/o body aches, right earache, HA "dealing with this cold almost a week"-reports neg covid test last week and today-NAD-steady gait

## 2021-05-29 NOTE — ED Provider Notes (Signed)
[facility] EMERGENCY DEPARTMENT Provider Note   CSN: [id number] Arrival date & time: 05/29/21  1244     History Chief Complaint  Patient presents with   Chest Pain   Generalized Body Aches    Debbie [name] is a 40 y.o. female presenting for evaluation of generalized body aches, sinus pressure, ear pain, cough.   Pt states she has been feeling poorly for about 1 week. She states symptoms are worsening despite tx with OTC medications. She has sinus pain/pressure, nonproductive cough, nausea, and ear pain. She had an episode of CP earlier today while cooking which lasted for a few minutes before resolving. No cp with inspiration or subsequent coughing. She denies fever s or chills. She has taken antigen covid tests at home which are negative. She has a h/o thyroid problems and lupus, is on immunosuppression. She is not on prednisone. No sick contacts. She is vaccinated for covid. She denies recent travel, surgeries, immobilization, h/o CA, h/o previous dvt/pe, or hormone use.   HPI     Past Medical History:  Diagnosis Date   Anemia    Chest pain    Hypothyroidism    Lupus ([place])    Migraine    Migraines    Systemic lupus ([place])    Thyroid disease    UTI (urinary tract infection)    Vaginal delivery 2002, 2009, 2015   Varicose veins    Left > than right    Patient Active Problem List   Diagnosis Date Noted   Rash and other nonspecific skin eruption 08/01/2020   History of asthma 08/01/2020   Adverse food reaction 08/01/2020   Abdominal pain 07/09/2019   Postoperative pain 07/07/2019   Postoperative state 06/23/2019   SLE (systemic lupus erythematosus) ([place]) 05/19/2019   Obesity (BMI 30.0-34.9) 05/19/2019   Depression, recurrent ([place]) 05/19/2019   Hypothyroidism 05/19/2019   Active labor 03/22/2014   Varicose veins of leg with pain 02/16/2013   Laryngitis, acute 04/28/2012   Vomiting 04/28/2012   Fever 04/28/2012   Subconjunctival hemorrhage sm prob from  vomiting 04/28/2012   Anemia 01/27/2012   Migraine 02/18/2011   Other allergic rhinitis 02/18/2011   Hypothyroid 02/18/2011    Past Surgical History:  Procedure Laterality Date   APPENDECTOMY     CYSTOSCOPY N/A 06/23/2019   Procedure: CYSTOSCOPY;  Surgeon: Bobbye Charleston, MD;  Location: [facility];  Service: Gynecology;  Laterality: N/A;   DILATION AND EVACUATION N/A 04/27/2013   Procedure: DILATATION AND EVACUATION;  Surgeon: Daria Pastures, MD;  Location: [facility];  Service: Gynecology;  Laterality: N/A;   LAPAROSCOPIC TUBAL LIGATION Bilateral 06/08/2014   Procedure: LAPAROSCOPIC TUBAL LIGATION;  Surgeon: Daria Pastures, MD;  Location: [facility];  Service: Gynecology;  Laterality: Bilateral;   ROBOTIC ASSISTED LAPAROSCOPIC HYSTERECTOMY AND SALPINGECTOMY Bilateral 06/23/2019   Procedure: XI ROBOTIC ASSISTED LAPAROSCOPIC HYSTERECTOMY AND SALPINGECTOMY;  Surgeon: Bobbye Charleston, MD;  Location: [facility];  Service: Gynecology;  Laterality: Bilateral;  TS RNFA confirmed on 06/08/19 CS   TONSILLECTOMY  2001   TONSILLECTOMY     TONSILLECTOMY AND ADENOIDECTOMY     UMBILICAL HERNIA REPAIR  03/2017   x2     OB History     Gravida  6   Para  3   Term  3   Preterm      AB  3   Living  3      SAB  2   IAB  1   Ectopic  Multiple      Live Births  3           Family History  Problem Relation Age of Onset   Hypertension Mother    Hyperlipidemia Father    Diabetes Father    Hypertension Father    Arthritis Neg Hx        family   Migraines Neg Hx     Social History   Tobacco Use   Smoking status: Never   Smokeless tobacco: Never  Vaping Use   Vaping Use: Never used  Substance Use Topics   Alcohol use: Yes    Comment: SOCIALLY   Drug use: No    Home Medications Prior to Admission medications   Medication Sig Start Date End Date Taking? Authorizing Provider  doxycycline (VIBRAMYCIN) 100 MG capsule Take 1 capsule  (100 mg total) by mouth 2 (two) times daily for 7 days. 05/29/21 06/05/21 Yes Shahram Alexopoulos, PA-C  Belimumab (BENLYSTA IV) Inject into the vein. Every 4 weeks.    [provider]  buPROPion (WELLBUTRIN) 100 MG tablet Take by mouth at bedtime. 07/12/20   [provider]  Cholecalciferol (VITAMIN D PO) Take 1,000 Units by mouth daily.     [provider]  cromolyn (OPTICROM) 4 % ophthalmic solution Place 1 drop into both eyes 4 (four) times daily as needed (itchy/watery eyes). 08/28/20   Garnet Sierras, DO  hydrOXYzine (VISTARIL) 25 MG capsule Take 1 capsule (25 mg total) by mouth at bedtime as needed for itching. Take 1 hour before bedtime. 08/07/20   Garnet Sierras, DO  ibuprofen (ADVIL) 800 MG tablet Take 1 tablet (800 mg total) by mouth every 8 (eight) hours. 06/24/19   Bobbye Charleston, MD  naproxen (NAPROSYN) 500 MG tablet Take 1 tablet (500 mg total) by mouth 2 (two) times daily. 02/14/20   Horton, Barbette Hair, MD  ondansetron (ZOFRAN) 4 MG tablet Take 1 tablet (4 mg total) by mouth every 6 (six) hours as needed for nausea. [id number]   Delora Fuel, MD  Rimegepant Sulfate (NURTEC) 75 MG TBDP Take 75 mg by mouth daily as needed (take for abortive therapy of migraine, no more than 1 tablet in 24 hours or 10 per month). 02/01/20   Lomax, Amy, NP  SYNTHROID 150 MCG tablet TAKE 1 TABLET(150 MCG) BY MOUTH DAILY BEFORE BREAKFAST 01/12/20   Isaac Bliss, Rayford Halsted, MD  Topiramate ER (TROKENDI XR) 25 MG CP24 Take 1 capsule (25 mg total) by mouth at bedtime. 02/01/20   Lomax, Amy, NP    Allergies    Amoxicillin, Aspirin, Diflucan [fluconazole], Bactrim, and Sulfa antibiotics  Review of Systems   Review of Systems  HENT:  Positive for ear pain, sinus pressure and sinus pain.   Respiratory:  Positive for cough.   Cardiovascular:  Positive for chest pain (resolved).  Musculoskeletal:  Positive for myalgias.  Allergic/Immunologic: Positive for immunocompromised state.  All other systems  reviewed and are negative.  Physical Exam Updated Vital Signs BP 101/68   Pulse 73   Temp 98.5 F (36.9 C) (Oral)   Resp 18   Ht '5\' 2"'$  (1.575 m)   Wt 83 kg   LMP 05/17/2019 (Approximate)   SpO2 100%   BMI 33.47 kg/m   Physical Exam Vitals and nursing note reviewed.  Constitutional:      General: She is not in acute distress.    Appearance: Normal appearance.     Comments: Appears as if she feels unwell, nontoxic  HENT:     Head: Normocephalic and atraumatic.     Nose: Mucosal edema present.     Right Sinus: Maxillary sinus tenderness and frontal sinus tenderness present.     Left Sinus: Maxillary sinus tenderness and frontal sinus tenderness present.  Eyes:     Conjunctiva/sclera: Conjunctivae normal.     Pupils: Pupils are equal, round, and reactive to light.  Cardiovascular:     Rate and Rhythm: Normal rate and regular rhythm.     Pulses: Normal pulses.  Pulmonary:     Effort: Pulmonary effort is normal. No respiratory distress.     Breath sounds: Normal breath sounds. No wheezing.     Comments: Speaking in full sentences.  Clear lung sounds in all fields. Abdominal:     General: There is no distension.     Palpations: Abdomen is soft. There is no mass.     Tenderness: There is no abdominal tenderness. There is no guarding or rebound.  Musculoskeletal:        General: Normal range of motion.     Cervical back: Normal range of motion and neck supple.     Right lower leg: No edema.     Left lower leg: No edema.  Skin:    General: Skin is warm and dry.     Capillary Refill: Capillary refill takes less than 2 seconds.  Neurological:     Mental Status: She is alert and oriented to person, place, and time.  Psychiatric:        Mood and Affect: Mood and affect normal.        Speech: Speech normal.        Behavior: Behavior normal.    ED Results / Procedures / Treatments   Labs (all labs ordered are listed, but only abnormal results are displayed) Labs Reviewed   CBC WITH DIFFERENTIAL/PLATELET - Abnormal; Notable for the following components:      Result Value   Hemoglobin 11.6 (*)    HCT 35.8 (*)    MCV 70.9 (*)    MCH 23.0 (*)    RDW 16.1 (*)    All other components within normal limits  RESP PANEL BY RT-PCR (FLU A&B, COVID) [id number]  COMPREHENSIVE METABOLIC PANEL  TROPONIN I (HIGH SENSITIVITY)    EKG EKG Interpretation  Date/Time:  Tuesday May 29 2021 12:56:07 EDT Ventricular Rate:  75 PR Interval:  146 QRS Duration: 66 QT Interval:  362 QTC Calculation: 404 R Axis:   69 Text Interpretation: Normal sinus rhythm Low voltage QRS Septal infarct , age undetermined Abnormal ECG since 2/22 no changes seen Confirmed by Noemi Chapel [phone]) on 05/29/2021 1:20:50 PM  Radiology DG Chest Port 1 View  Result Date: 05/29/2021 CLINICAL DATA:  Chest pain EXAM: PORTABLE CHEST 1 VIEW COMPARISON:  Chest x-ray dated November 03, 2020 FINDINGS: The heart size and mediastinal contours are within normal limits. Both lungs are clear. The visualized skeletal structures are unremarkable. IMPRESSION: No active disease. Electronically Signed   By: Yetta Glassman M.D.   On: 05/29/2021 14:17    Procedures Procedures   Medications Ordered in ED Medications  sodium chloride 0.9 % bolus 1,000 mL (0 mLs Intravenous Stopped 05/29/21 1635)  ketorolac (TORADOL) 15 MG/ML injection 15 mg (15 mg Intravenous Given 05/29/21 1420)  acetaminophen (TYLENOL) tablet 650 mg (650 mg Oral Given 05/29/21 1634)    ED Course  I have reviewed the triage vital signs and the nursing notes.  Pertinent labs & imaging results  that were available during my care of the patient were reviewed by me and considered in my medical decision making (see chart for details).    MDM Rules/Calculators/A&P                           Patient presented for evaluation of 1 week history of URI/viral symptoms.  On exam, patient appears nontoxic.  She did report chest pain earlier today, however it  is nonpleuritic.  No PE risk factors, doubt PE.  Likely viral illness causing irritation of the lungs/possible costochondritis.  However considering patient's history of lupus and being on immunosuppression, also consider pneumonia.  Less likely cardiac cause, however in the setting of acute chest pain today, will obtain labs including troponin, EKG.  Chest x-ray viewed and independently interpreted by me, no pneumonia with dorsal effusion.  EKG unchanged from previous.  Labs overall reassuring.  On reevaluation, patient reports improvement of symptoms.  I discussed continued symptomatic management.  In the setting of worsening symptoms, will place patient on antibiotics for sinusitis.  Encouraged close follow-up with PCP.  At this time, patient appears safe for discharge.  Return precautions given.  Patient states she understands and agrees to plan.  Final Clinical Impression(s) / ED Diagnoses Final diagnoses:  Acute maxillary sinusitis, recurrence not specified    Rx / DC Orders ED Discharge Orders          Ordered    doxycycline (VIBRAMYCIN) 100 MG capsule  2 times daily        05/29/21 [place], Tarek Cravens, PA-C 05/29/21 1715    Noemi Chapel, MD 06/10/21 [phone]

## 2021-05-29 NOTE — ED Notes (Signed)
Pt discharged to home. Discharge instructions have been discussed with patient and/or family members. Pt verbally acknowledges understanding d/c instructions, and endorses comprehension to checkout at registration before leaving.  °

## 2021-05-29 NOTE — Discharge Instructions (Signed)
Continue taking home medications as prescribed. Take doxycycline to help treat his sinus infection. Continue using Tylenol ibuprofen as needed for pain. Continue using Flonase and Zyrtec to help with congestion. Make sure you are staying well-hydrated water. Follow-up with your primary care doctor for recheck of your symptoms. Return to the emergency room with any new, sudden, concerning symptoms.

## 2021-11-18 DIAGNOSIS — D84821 Immunodeficiency due to drugs: Secondary | ICD-10-CM | POA: Insufficient documentation

## 2021-11-20 DIAGNOSIS — Z9109 Other allergy status, other than to drugs and biological substances: Secondary | ICD-10-CM | POA: Insufficient documentation

## 2022-04-26 ENCOUNTER — Encounter (HOSPITAL_BASED_OUTPATIENT_CLINIC_OR_DEPARTMENT_OTHER): Payer: Self-pay | Admitting: Emergency Medicine

## 2022-04-26 ENCOUNTER — Emergency Department (HOSPITAL_BASED_OUTPATIENT_CLINIC_OR_DEPARTMENT_OTHER)
Admission: EM | Admit: 2022-04-26 | Discharge: 2022-04-27 | Disposition: A | Discharge status: Home / Self Care | Payer: Medicare HMO | Attending: Emergency Medicine | Admitting: Emergency Medicine

## 2022-04-26 DIAGNOSIS — D849 Immunodeficiency, unspecified: Secondary | ICD-10-CM | POA: Diagnosis not present

## 2022-04-26 DIAGNOSIS — R1310 Dysphagia, unspecified: Secondary | ICD-10-CM | POA: Diagnosis present

## 2022-04-26 NOTE — ED Triage Notes (Signed)
Pt states was diagnosed with flu like symptoms about 3 weeks ago  Pt states she has been on different medications but her dr and is not improving  Pt states tonight she is having difficulty swallowing

## 2022-04-27 LAB — CBC WITH DIFFERENTIAL/PLATELET
Abs Immature Granulocytes: 0.04 10*3/uL (ref 0.00–0.07)
Basophils Absolute: 0 10*3/uL (ref 0.0–0.1)
Basophils Relative: 0 %
Eosinophils Absolute: 0 10*3/uL (ref 0.0–0.5)
Eosinophils Relative: 0 %
HCT: 37 % (ref 36.0–46.0)
Hemoglobin: 12.3 g/dL (ref 12.0–15.0)
Immature Granulocytes: 0 %
Lymphocytes Relative: 5 %
Lymphs Abs: 0.6 10*3/uL — ABNORMAL LOW (ref 0.7–4.0)
MCH: 23.6 pg — ABNORMAL LOW (ref 26.0–34.0)
MCHC: 33.2 g/dL (ref 30.0–36.0)
MCV: 71 fL — ABNORMAL LOW (ref 80.0–100.0)
Monocytes Absolute: 0.4 10*3/uL (ref 0.1–1.0)
Monocytes Relative: 4 %
Neutro Abs: 11.1 10*3/uL — ABNORMAL HIGH (ref 1.7–7.7)
Neutrophils Relative %: 91 %
Platelets: 286 10*3/uL (ref 150–400)
RBC: 5.21 MIL/uL — ABNORMAL HIGH (ref 3.87–5.11)
RDW: 15 % (ref 11.5–15.5)
Smear Review: NORMAL
WBC: 12.2 10*3/uL — ABNORMAL HIGH (ref 4.0–10.5)
nRBC: 0.2 % (ref 0.0–0.2)

## 2022-04-27 LAB — BASIC METABOLIC PANEL
Anion gap: 7 (ref 5–15)
BUN: 19 mg/dL (ref 6–20)
CO2: 23 mmol/L (ref 22–32)
Calcium: 8.7 mg/dL — ABNORMAL LOW (ref 8.9–10.3)
Chloride: 107 mmol/L (ref 98–111)
Creatinine, Ser: 0.93 mg/dL (ref 0.44–1.00)
GFR, Estimated: >60 mL/min (ref >60)
Glucose, Bld: 158 mg/dL — ABNORMAL HIGH (ref 70–99)
Potassium: 4.1 mmol/L (ref 3.5–5.1)
Sodium: 137 mmol/L (ref 135–145)

## 2022-04-27 LAB — GROUP A STREP BY PCR: Group A Strep by PCR: NOT DETECTED

## 2022-04-27 MED ORDER — LACTATED RINGERS IV BOLUS
1000.0000 mL | Freq: Once | INTRAVENOUS | Status: AC
  Administered 2022-04-27: 1000 mL via INTRAVENOUS

## 2022-04-27 MED ORDER — LIDOCAINE VISCOUS HCL 2 % MT SOLN
15.0000 mL | Freq: Once | OROMUCOSAL | Status: AC
  Administered 2022-04-27: 15 mL via OROMUCOSAL
  Filled 2022-04-27: qty 15

## 2022-04-27 MED ORDER — NYSTATIN 100000 UNIT/ML MT SUSP: 500000.0000 [IU] | Freq: Four times a day (QID) | OROMUCOSAL | 0 refills | Status: DC

## 2022-04-27 NOTE — ED Provider Notes (Signed)
[facility]  Provider Note  CSN: [id number] Arrival date & time: 04/26/22 2338  History Chief Complaint  Patient presents with   Dysphagia   Debbie [name] is a 41 y.o. female with history of Lupus on Benysta infusions at [facility] reports she was feeling sick a few weeks ago, seen at [facility] and diagnosed with influenza A on 7/17. She subsequently began having increasing pain with swallowing, had video visit 7/25 when she was diagnosed with bacterial pharyngitis and given Rx for doxycycline, prednisone and viscous lidocaine, not improving so she had a office visit yesterday when she was diagnosed with bacterial sinusitis but no additional prescriptions were given. She had another video visit this evening when she was advised to go to the ED. She went to [facility] but there was a large crowd so she left and came here. She has had prior issues with dyphagia and odynophagia, admitted in 2017 at [facility] with thrush, given diflucan and IVF with improvement but apparently developed an allergy to diflucan in the meantime. She has also been seen by GI previously and had EGD showing esophagitis, treated with PPI. She has not had a fever.    Home Medications Prior to Admission medications   Medication Sig Start Date End Date Taking? Authorizing Provider  nystatin (MYCOSTATIN) 100000 UNIT/ML suspension Take 5 mLs (500,000 Units total) by mouth 4 (four) times daily. 04/27/22  Yes Truddie Hidden, MD  Belimumab (BENLYSTA IV) Inject into the vein. Every 4 weeks.    [provider]  buPROPion (WELLBUTRIN) 100 MG tablet Take by mouth at bedtime. 07/12/20   [provider]  Cholecalciferol (VITAMIN D PO) Take 1,000 Units by mouth daily.     [provider]  cromolyn (OPTICROM) 4 % ophthalmic solution Place 1 drop into both eyes 4 (four) times daily as needed (itchy/watery eyes). 08/28/20   Garnet Sierras, DO  hydrOXYzine (VISTARIL) 25 MG capsule Take 1 capsule (25 mg  total) by mouth at bedtime as needed for itching. Take 1 hour before bedtime. 08/07/20   Garnet Sierras, DO  ibuprofen (ADVIL) 800 MG tablet Take 1 tablet (800 mg total) by mouth every 8 (eight) hours. 06/24/19   Bobbye Charleston, MD  naproxen (NAPROSYN) 500 MG tablet Take 1 tablet (500 mg total) by mouth 2 (two) times daily. 02/14/20   Horton, Barbette Hair, MD  ondansetron (ZOFRAN) 4 MG tablet Take 1 tablet (4 mg total) by mouth every 6 (six) hours as needed for nausea. 9/89/21   Delora Fuel, MD  Rimegepant Sulfate (NURTEC) 75 MG TBDP Take 75 mg by mouth daily as needed (take for abortive therapy of migraine, no more than 1 tablet in 24 hours or 10 per month). 02/01/20   Lomax, Amy, NP  SYNTHROID 150 MCG tablet TAKE 1 TABLET(150 MCG) BY MOUTH DAILY BEFORE BREAKFAST 01/12/20   Isaac Bliss, Rayford Halsted, MD  Topiramate ER (TROKENDI XR) 25 MG CP24 Take 1 capsule (25 mg total) by mouth at bedtime. 02/01/20   Lomax, Amy, NP     Allergies    Amoxicillin, Aspirin, Diflucan [fluconazole], Bactrim, and Sulfa antibiotics   Review of Systems   Review of Systems Please see HPI for pertinent positives and negatives  Physical Exam BP 99/66   Pulse 77   Temp 98.6 F (37 C) (Oral)   Resp 16   Ht '5\' 2"'$  (1.575 m)   Wt 81.8 kg   LMP 05/17/2019 (Approximate)   SpO2 100%  BMI 32.98 kg/m   Physical Exam Vitals and nursing note reviewed.  Constitutional:      Appearance: Normal appearance.  HENT:     Head: Normocephalic and atraumatic.     Nose: Nose normal.     Mouth/Throat:     Mouth: Mucous membranes are moist.     Pharynx: No oropharyngeal exudate or posterior oropharyngeal erythema.     Comments: No oral thrush lesions Eyes:     Extraocular Movements: Extraocular movements intact.     Conjunctiva/sclera: Conjunctivae normal.  Cardiovascular:     Rate and Rhythm: Normal rate.  Pulmonary:     Effort: Pulmonary effort is normal.     Breath sounds: Normal breath sounds.  Abdominal:     General:  Abdomen is flat.     Palpations: Abdomen is soft.     Tenderness: There is no abdominal tenderness.  Musculoskeletal:        General: No swelling. Normal range of motion.     Cervical back: Neck supple.  Skin:    General: Skin is warm and dry.  Neurological:     General: No focal deficit present.     Mental Status: She is alert.  Psychiatric:        Mood and Affect: Mood normal.     ED Results / Procedures / Treatments   EKG None  Procedures Procedures  Medications Ordered in the ED Medications  lactated ringers bolus 1,000 mL (0 mLs Intravenous Stopped 04/27/22 0110)  lidocaine (XYLOCAINE) 2 % viscous mouth solution 15 mL (15 mLs Mouth/Throat Given 04/27/22 0024)    Initial Impression and Plan  Patient with pain on swallowing, oral exam is benign. She has been on doxycycline and prednisone recently, question whether this is a flare of esophageal thrush. Will check labs, given IVF and viscous lidocaine for symptom improvement. We do not have nystatin suspension here.  ED Course   Clinical Course as of 04/27/22 0116  Sat Apr 27, 2022  0059 CBC with mild leukocytosis, has been on steroids recently. BMP with mild hyperglycemia. Strep is neg.  [CS]  0113 Labs are unremarkable. She does not have an indication for admission today. I recommend she stop both the antibiotics and the steroids as they are unlikely to be helpful. Will Rx nystatin swish and swallow. Recommend outpatient GI follow up to be arranged by her PCP in the [place] system for continuity. [CS]    Clinical Course User Index [CS] Truddie Hidden, MD     MDM Rules/Calculators/A&P Medical Decision Making Problems Addressed: Dysphagia, unspecified type: acute illness or injury  Amount and/or Complexity of Data Reviewed Labs: ordered. Decision-making details documented in ED Course.  Risk Prescription drug management. Decision regarding hospitalization.    Final Clinical Impression(s) / ED  Diagnoses Final diagnoses:  Dysphagia, unspecified type    Rx / DC Orders ED Discharge Orders          Ordered    nystatin (MYCOSTATIN) 100000 UNIT/ML suspension  4 times daily        04/27/22 0116             Truddie Hidden, MD 04/27/22 [id number]

## 2022-07-09 DIAGNOSIS — E785 Hyperlipidemia, unspecified: Secondary | ICD-10-CM | POA: Insufficient documentation

## 2022-10-18 ENCOUNTER — Other Ambulatory Visit: Payer: Self-pay

## 2022-10-18 ENCOUNTER — Emergency Department (HOSPITAL_BASED_OUTPATIENT_CLINIC_OR_DEPARTMENT_OTHER): Payer: Medicare HMO

## 2022-10-18 ENCOUNTER — Encounter (HOSPITAL_BASED_OUTPATIENT_CLINIC_OR_DEPARTMENT_OTHER): Payer: Self-pay

## 2022-10-18 ENCOUNTER — Emergency Department (HOSPITAL_BASED_OUTPATIENT_CLINIC_OR_DEPARTMENT_OTHER)
Admission: EM | Admit: 2022-10-18 | Discharge: 2022-10-18 | Disposition: A | Discharge status: Home / Self Care | Payer: Medicare HMO | Attending: Emergency Medicine | Admitting: Emergency Medicine

## 2022-10-18 DIAGNOSIS — R109 Unspecified abdominal pain: Secondary | ICD-10-CM | POA: Diagnosis present

## 2022-10-18 DIAGNOSIS — Z79890 Hormone replacement therapy (postmenopausal): Secondary | ICD-10-CM | POA: Diagnosis not present

## 2022-10-18 DIAGNOSIS — E039 Hypothyroidism, unspecified: Secondary | ICD-10-CM | POA: Diagnosis not present

## 2022-10-18 DIAGNOSIS — R1084 Generalized abdominal pain: Secondary | ICD-10-CM | POA: Insufficient documentation

## 2022-10-18 LAB — CBC WITH DIFFERENTIAL/PLATELET
Abs Immature Granulocytes: 0.01 10*3/uL (ref 0.00–0.07)
Basophils Absolute: 0 10*3/uL (ref 0.0–0.1)
Basophils Relative: 1 %
Eosinophils Absolute: 0.2 10*3/uL (ref 0.0–0.5)
Eosinophils Relative: 4 %
HCT: 38.2 % (ref 36.0–46.0)
Hemoglobin: 12.4 g/dL (ref 12.0–15.0)
Immature Granulocytes: 0 %
Lymphocytes Relative: 36 %
Lymphs Abs: 1.9 10*3/uL (ref 0.7–4.0)
MCH: 23.4 pg — ABNORMAL LOW (ref 26.0–34.0)
MCHC: 32.5 g/dL (ref 30.0–36.0)
MCV: 71.9 fL — ABNORMAL LOW (ref 80.0–100.0)
Monocytes Absolute: 0.7 10*3/uL (ref 0.1–1.0)
Monocytes Relative: 14 %
Neutro Abs: 2.5 10*3/uL (ref 1.7–7.7)
Neutrophils Relative %: 45 %
Platelets: 311 10*3/uL (ref 150–400)
RBC: 5.31 MIL/uL — ABNORMAL HIGH (ref 3.87–5.11)
RDW: 16.3 % — ABNORMAL HIGH (ref 11.5–15.5)
Smear Review: NORMAL
WBC: 5.4 10*3/uL (ref 4.0–10.5)
nRBC: 0.4 % — ABNORMAL HIGH (ref 0.0–0.2)

## 2022-10-18 LAB — URINALYSIS, ROUTINE W REFLEX MICROSCOPIC
Bilirubin Urine: NEGATIVE
Glucose, UA: NEGATIVE mg/dL
Hgb urine dipstick: NEGATIVE
Ketones, ur: NEGATIVE mg/dL
Leukocytes,Ua: NEGATIVE
Nitrite: NEGATIVE
Protein, ur: NEGATIVE mg/dL
Specific Gravity, Urine: 1.02 (ref 1.005–1.030)
pH: 7 (ref 5.0–8.0)

## 2022-10-18 LAB — PREGNANCY, URINE: Preg Test, Ur: NEGATIVE

## 2022-10-18 LAB — LIPASE, BLOOD: Lipase: 50 U/L (ref 11–51)

## 2022-10-18 LAB — COMPREHENSIVE METABOLIC PANEL
ALT: 12 U/L (ref 0–44)
AST: 21 U/L (ref 15–41)
Albumin: 4.6 g/dL (ref 3.5–5.0)
Alkaline Phosphatase: 44 U/L (ref 38–126)
Anion gap: 5 (ref 5–15)
BUN: 15 mg/dL (ref 6–20)
CO2: 25 mmol/L (ref 22–32)
Calcium: 8.9 mg/dL (ref 8.9–10.3)
Chloride: 106 mmol/L (ref 98–111)
Creatinine, Ser: 0.9 mg/dL (ref 0.44–1.00)
GFR, Estimated: >60 mL/min (ref >60)
Glucose, Bld: 72 mg/dL (ref 70–99)
Potassium: 3.8 mmol/L (ref 3.5–5.1)
Sodium: 136 mmol/L (ref 135–145)
Total Bilirubin: 0.7 mg/dL (ref 0.3–1.2)
Total Protein: 7.6 g/dL (ref 6.5–8.1)

## 2022-10-18 MED ORDER — IOHEXOL 300 MG/ML  SOLN
100.0000 mL | Freq: Once | INTRAMUSCULAR | Status: AC | PRN
  Administered 2022-10-18: 100 mL via INTRAVENOUS

## 2022-10-18 MED ORDER — FENTANYL CITRATE PF 50 MCG/ML IJ SOSY
50.0000 ug | PREFILLED_SYRINGE | Freq: Once | INTRAMUSCULAR | Status: AC
  Administered 2022-10-18: 50 ug via INTRAVENOUS
  Filled 2022-10-18: qty 1

## 2022-10-18 MED ORDER — ONDANSETRON HCL 4 MG/2ML IJ SOLN
4.0000 mg | Freq: Once | INTRAMUSCULAR | Status: AC
  Administered 2022-10-18: 4 mg via INTRAVENOUS
  Filled 2022-10-18: qty 2

## 2022-10-18 MED ORDER — SODIUM CHLORIDE 0.9 % IV BOLUS
1000.0000 mL | Freq: Once | INTRAVENOUS | Status: AC
  Administered 2022-10-18: 1000 mL via INTRAVENOUS

## 2022-10-18 MED ORDER — ALUM & MAG HYDROXIDE-SIMETH 200-200-20 MG/5ML PO SUSP
30.0000 mL | Freq: Once | ORAL | Status: AC
  Administered 2022-10-18: 30 mL via ORAL
  Filled 2022-10-18: qty 30

## 2022-10-18 NOTE — ED Triage Notes (Addendum)
Pt was at GI doctor today due to upper abdominal pain for 3 days along with Nausea and vomiting. Vomitinx 1 this am. Denies blood in stool GI sent pt here to be evaluated. Unsure of what GI dx is at this time

## 2022-10-18 NOTE — Discharge Instructions (Addendum)
You were evaluated the emergency department for abdominal pain.  Your overall workup today was nonspecific, imaging and labs did not show any acute findings of concern.  Please continue with GI follow-up next week as discussed.  After reviewing their notes from today, they recommended to complete the bowel purge as directed, start linzess 72 mcg daily, and start Pantoprazole 40 mg daily.  Return to the ED for new or worsening symptoms as discussed.

## 2022-10-18 NOTE — ED Notes (Signed)
Pt states  her GI dr had already called in the rx she was told to take today

## 2022-10-18 NOTE — ED Provider Notes (Signed)
[facility] Provider Note   CSN: [id number] Arrival date & time: 10/18/22  1152     History  Chief Complaint  Patient presents with   Abdominal Pain    Debbie [name] is a 42 y.o. female with Hx of umbilical hernia repair presenting to the ED with worsening abdominal pain the last 3 weeks, and significant worsening over the last 3 days.  Sent by GI for further evaluation.  Denies fevers, chills, vomiting, urinary symptoms, or back pain.  Does endorse worsening nausea and new mild constipation with possible dark stools, denies prior history of constipation.  Denies bright red blood per rectum.  Tried antacids and NSAIDs without relief.  The history is provided by the patient and medical records.  Abdominal Pain     Home Medications Prior to Admission medications   Medication Sig Start Date End Date Taking? Authorizing Provider  Belimumab (BENLYSTA IV) Inject into the vein. Every 4 weeks.    [provider]  buPROPion (WELLBUTRIN) 100 MG tablet Take by mouth at bedtime. 07/12/20   [provider]  Cholecalciferol (VITAMIN D PO) Take 1,000 Units by mouth daily.     [provider]  cromolyn (OPTICROM) 4 % ophthalmic solution Place 1 drop into both eyes 4 (four) times daily as needed (itchy/watery eyes). 08/28/20   Garnet Sierras, DO  hydrOXYzine (VISTARIL) 25 MG capsule Take 1 capsule (25 mg total) by mouth at bedtime as needed for itching. Take 1 hour before bedtime. 08/07/20   Garnet Sierras, DO  ibuprofen (ADVIL) 800 MG tablet Take 1 tablet (800 mg total) by mouth every 8 (eight) hours. 06/24/19   Bobbye Charleston, MD  naproxen (NAPROSYN) 500 MG tablet Take 1 tablet (500 mg total) by mouth 2 (two) times daily. 02/14/20   Horton, Barbette Hair, MD  nystatin (MYCOSTATIN) 100000 UNIT/ML suspension Take 5 mLs (500,000 Units total) by mouth 4 (four) times daily. 04/27/22   Truddie Hidden, MD  ondansetron (ZOFRAN) 4 MG tablet  Take 1 tablet (4 mg total) by mouth every 6 (six) hours as needed for nausea. 7/61/60   Delora Fuel, MD  Rimegepant Sulfate (NURTEC) 75 MG TBDP Take 75 mg by mouth daily as needed (take for abortive therapy of migraine, no more than 1 tablet in 24 hours or 10 per month). 02/01/20   Lomax, Amy, NP  SYNTHROID 150 MCG tablet TAKE 1 TABLET(150 MCG) BY MOUTH DAILY BEFORE BREAKFAST 01/12/20   Isaac Bliss, Rayford Halsted, MD  Topiramate ER (TROKENDI XR) 25 MG CP24 Take 1 capsule (25 mg total) by mouth at bedtime. 02/01/20   Lomax, Amy, NP      Allergies    Amoxicillin, Aspirin, Diflucan [fluconazole], Bactrim, and Sulfa antibiotics    Review of Systems   Review of Systems  Gastrointestinal:  Positive for abdominal pain.    Physical Exam Updated Vital Signs BP 110/70   Pulse 70   Temp 98.1 F (36.7 C)   Resp 18   Ht '5\' 2"'$  (1.575 m)   Wt 80.7 kg   LMP 05/17/2019 (Approximate)   SpO2 100%   BMI 32.56 kg/m  Physical Exam Vitals and nursing note reviewed.  Constitutional:      General: She is not in acute distress.    Appearance: She is well-developed. She is not ill-appearing, toxic-appearing or diaphoretic.     Comments: Uncomfortable appearing  HENT:     Head: Normocephalic and atraumatic.  Eyes:  General: No scleral icterus.    Conjunctiva/sclera: Conjunctivae normal.  Cardiovascular:     Rate and Rhythm: Normal rate and regular rhythm.     Heart sounds: No murmur heard. Pulmonary:     Effort: Pulmonary effort is normal. No respiratory distress.     Breath sounds: Normal breath sounds.  Chest:     Chest wall: No tenderness.  Abdominal:     General: There is no distension.     Palpations: Abdomen is soft. There is no hepatomegaly, splenomegaly, mass or pulsatile mass.     Tenderness: There is no abdominal tenderness.     Comments: Mildly protuberant with centralized tenderness.  No LLQ or epigastric tenderness, with mild RLQ tenderness.  No ecchymosis, erythema, or distention.   Negative McBurney sign and Murphy's sign.  Musculoskeletal:        General: No swelling.     Cervical back: Neck supple.  Skin:    General: Skin is warm and dry.     Capillary Refill: Capillary refill takes less than 2 seconds.     Coloration: Skin is not cyanotic, jaundiced or pale.  Neurological:     Mental Status: She is alert.  Psychiatric:        Mood and Affect: Mood normal.     ED Results / Procedures / Treatments   Labs (all labs ordered are listed, but only abnormal results are displayed) Labs Reviewed  CBC WITH DIFFERENTIAL/PLATELET - Abnormal; Notable for the following components:      Result Value   RBC 5.31 (*)    MCV 71.9 (*)    MCH 23.4 (*)    RDW 16.3 (*)    nRBC 0.4 (*)    All other components within normal limits  LIPASE, BLOOD  COMPREHENSIVE METABOLIC PANEL  URINALYSIS, ROUTINE W REFLEX MICROSCOPIC  PREGNANCY, URINE    EKG None  Radiology CT Abdomen Pelvis W Contrast  Result Date: 10/18/2022 CLINICAL DATA:  Acute abdominal pain, nonlocalized. Associated with nausea and vomiting. EXAM: CT ABDOMEN AND PELVIS WITH CONTRAST TECHNIQUE: Multidetector CT imaging of the abdomen and pelvis was performed using the standard protocol following bolus administration of intravenous contrast. RADIATION DOSE REDUCTION: This exam was performed according to the departmental dose-optimization program which includes automated exposure control, adjustment of the mA and/or kV according to patient size and/or use of iterative reconstruction technique. CONTRAST:  177m OMNIPAQUE IOHEXOL 300 MG/ML  SOLN COMPARISON:  CT abdomen/pelvis 07/08/2019. FINDINGS: Lower chest: Lung bases are clear. No pleural or pericardial effusion. Hepatobiliary: No focal liver abnormality is seen. No gallstones, gallbladder wall thickening, or biliary dilatation. Pancreas: Unremarkable. No pancreatic ductal dilatation or surrounding inflammatory changes. Spleen: Absent. Adrenals/Urinary Tract: Normal  adrenal glands. Unchanged subcentimeter hypoattenuating lesion in the interpolar region of the left kidney, too small to characterize (No follow-up imaging is recommended). Normal right kidney. No hydronephrosis. Normal bladder. Stomach/Bowel: Normal distal esophagus, stomach and duodenum. Nondilated small bowel loops. Appendix is not visualized. Colon and rectum are unremarkable. Vascular/Lymphatic: No significant vascular findings are present. No enlarged abdominal or pelvic lymph nodes. Reproductive: Status post hysterectomy. Corpus luteum in left ovary. Other: Trace free fluid in the pelvis.  Prior ventral hernia repair. Musculoskeletal: No suspicious bone lesions. IMPRESSION: No acute findings in the abdomen to explain abdominal pain. Electronically Signed   By: WEmmit AlexandersM.D.   On: 10/18/2022 15:01    Procedures Procedures    Medications Ordered in ED Medications  fentaNYL (SUBLIMAZE) injection 50 mcg (50 mcg Intravenous  Given 10/18/22 1334)  ondansetron (ZOFRAN) injection 4 mg (4 mg Intravenous Given 10/18/22 1334)  sodium chloride 0.9 % bolus 1,000 mL (0 mLs Intravenous Stopped 10/18/22 1507)  iohexol (OMNIPAQUE) 300 MG/ML solution 100 mL (100 mLs Intravenous Contrast Given 10/18/22 1431)  ondansetron (ZOFRAN) injection 4 mg (4 mg Intravenous Given 10/18/22 1445)  alum & mag hydroxide-simeth (MAALOX/MYLANTA) 200-200-20 MG/5ML suspension 30 mL (30 mLs Oral Given 10/18/22 1525)    ED Course/ Medical Decision Making/ A&P Clinical Course as of 10/18/22 1634  Fri Oct 18, 2022  1257 Abdominal pain and decreased PO. W/U for SBO [CC]  1421 Preg Test, Ur: NEGATIVE [AC]  1516 CT Abdomen Pelvis W Contrast Negative  [AC]    Clinical Course User Index [AC] Prince Rome, PA-C [CC] Tretha Sciara, MD                             Medical Decision Making Amount and/or Complexity of Data Reviewed Labs: ordered.   42 y.o. female presents to the ED for concern of Abdominal Pain    This involves an extensive number of treatment options, and is a complaint that carries with it a high risk of complications and morbidity.  The emergent differential diagnosis prior to evaluation includes, but is not limited to: Appendicitis, Bowel Obstruction, Pyelonephritis, Nephrolithiasis, Pancreatitis, Cholecystitis, Shingles, Perforated Bowel or Ulcer, Diverticulosis/itis, Ischemic Mesentery, Inflammatory Bowel Disease, Strangulated/Incarcerated Hernia, gastritis, PUD, TOA, or ovarian torsion.   This is not an exhaustive differential.   Past Medical History / Co-morbidities / Social History: Hx of GERD, SLE, anemia, hypothyroidism, migraines Social Determinants of Health include: None  Additional History:  Obtained by chart review.  Notably recent GI visit summary, which recommended starting pantoprazole 40 mg daily, completing bowel purge, and starting Linzess 72 mcg daily  Lab Tests: I ordered, and personally interpreted labs.  The pertinent results include:   WC 5.4, no leukocytosis Urine pregnancy negative Lipase 50 CMP without electrolyte derangement, AKI, or abnormal LFTs UA unremarkable  Imaging Studies: I ordered imaging studies including CT abdomen pelvis.   I independently visualized and interpreted imaging which showed no acute abdominal pathology I agree with the radiologist interpretation.  ED Course: Pt well-appearing on exam.  Presenting with worsening abdominal pain over the last 3 weeks, most significantly over the last 3 days.  Patient is nontoxic, nonseptic appearing, in no apparent distress.  Patient's pain and other symptoms adequately managed in ED.  Fluid bolus, Zofran given.  Labs, history and vitals reviewed.  Patient does not meet the SIRS or Sepsis criteria.  Plan for CT abdomen/pelvis and Maalox and reassess. On repeat exam patient does not have a surgical abdomen and there are no peritoneal signs.  Significant improvement appreciated with Maalox.   Imaging without indication of appendicitis, bowel obstruction, bowel perforation, renal calculi, cholecystitis, pancreatitis, pyelonephritis, diverticulitis, TOA, or ectopic pregnancy.  Hx of hysterectomy seen on imaging.  Overall, I am uncertain the exact etiology of the patient's symptoms.  However, I do not believe she is currently experiencing a medical, surgical, or psychiatric emergency.  Etiology may be PUD/GERD related.  Recommend close follow up with GI for re-evaluation and continued management.  Also recommend continuing with GI recommendations from earlier appointment today, see above for details.  Continued conservative symptomatic management discussed.  Strict return precautions discussed.  Patient reports satisfaction with today's encounter.  Patient in NAD and in good condition at time of discharge.  Disposition: After consideration the patient's encounter today, I do not feel today's workup suggests an emergent condition requiring admission or immediate intervention beyond what has been performed at this time.  Safe for discharge; instructed to return immediately for worsening symptoms, change in symptoms or any other concerns.  I have reviewed the patients home medicines and have made adjustments as needed.  Discussed course of treatment with the patient, whom demonstrated understanding.  Patient in agreement and has no further questions.    I discussed this case with my attending physician Dr. Oswald Hillock, who agreed with the proposed treatment course and cosigned this note.  Attending physician stated agreement with plan or made changes to plan which were implemented.    This chart was dictated using voice recognition software.  Despite best efforts to proofread, errors can occur which can change the documentation meaning.         Final Clinical Impression(s) / ED Diagnoses Final diagnoses:  Generalized abdominal pain    Rx / DC Orders ED Discharge Orders     None          Candace Cruise 62/37/62 1635    Tretha Sciara, MD 10/19/22 1459

## 2022-11-06 IMAGING — DX DG CHEST 1V PORT
1 series · 1 of 1 positions shown · non-contrast
Comparison: December 02, 2019

CLINICAL DATA: Lupus

EXAM:
PORTABLE CHEST 1 VIEW

[chest ap]
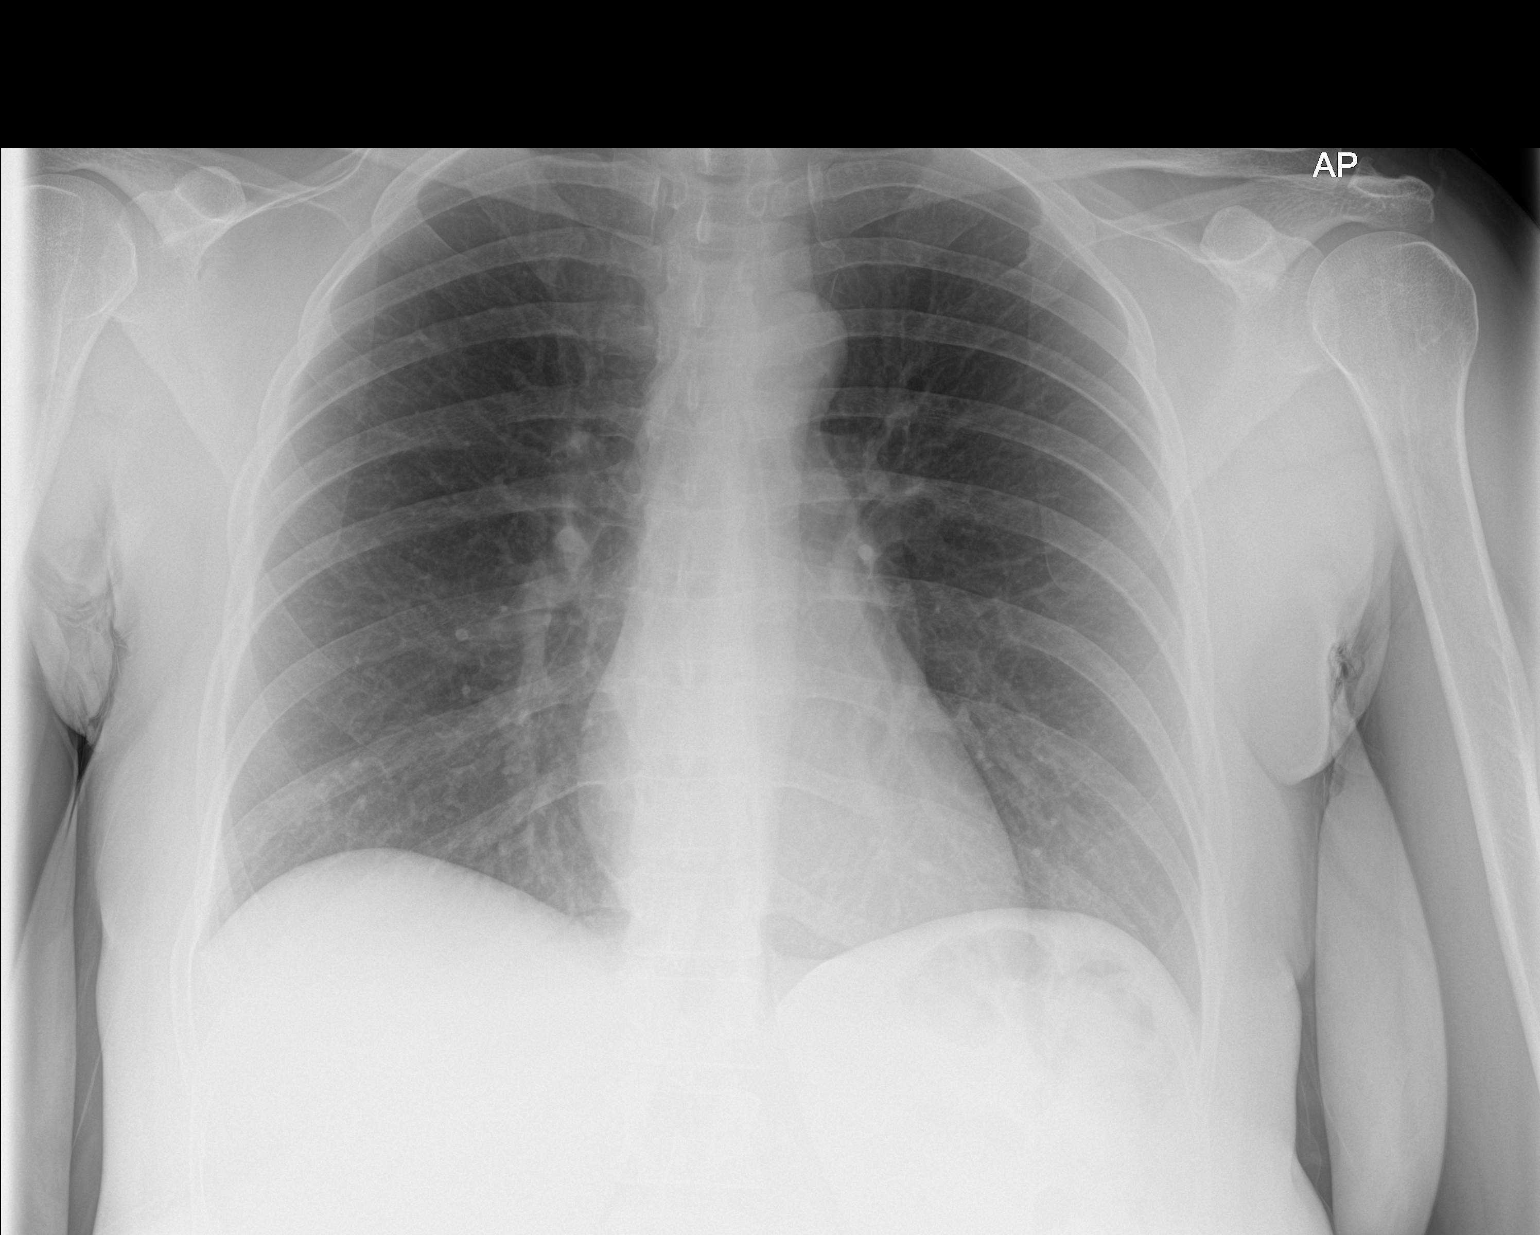

[1 of 1 positions shown; findings below may reference images not displayed]

FINDINGS: The cardiomediastinal silhouette is normal in contour. No pleural
effusion. No pneumothorax. No acute pleuroparenchymal abnormality.
Visualized abdomen is unremarkable. No acute osseous abnormality
noted.
IMPRESSION: No acute cardiopulmonary abnormality.

## 2023-04-02 ENCOUNTER — Ambulatory Visit: Payer: Medicare HMO | Admitting: Family

## 2023-06-01 IMAGING — DX DG CHEST 1V PORT
1 series · 1 of 1 positions shown · non-contrast
Comparison: Chest x-ray dated November 03, 2020

CLINICAL DATA: Chest pain

EXAM:
PORTABLE CHEST 1 VIEW

[chest ap]
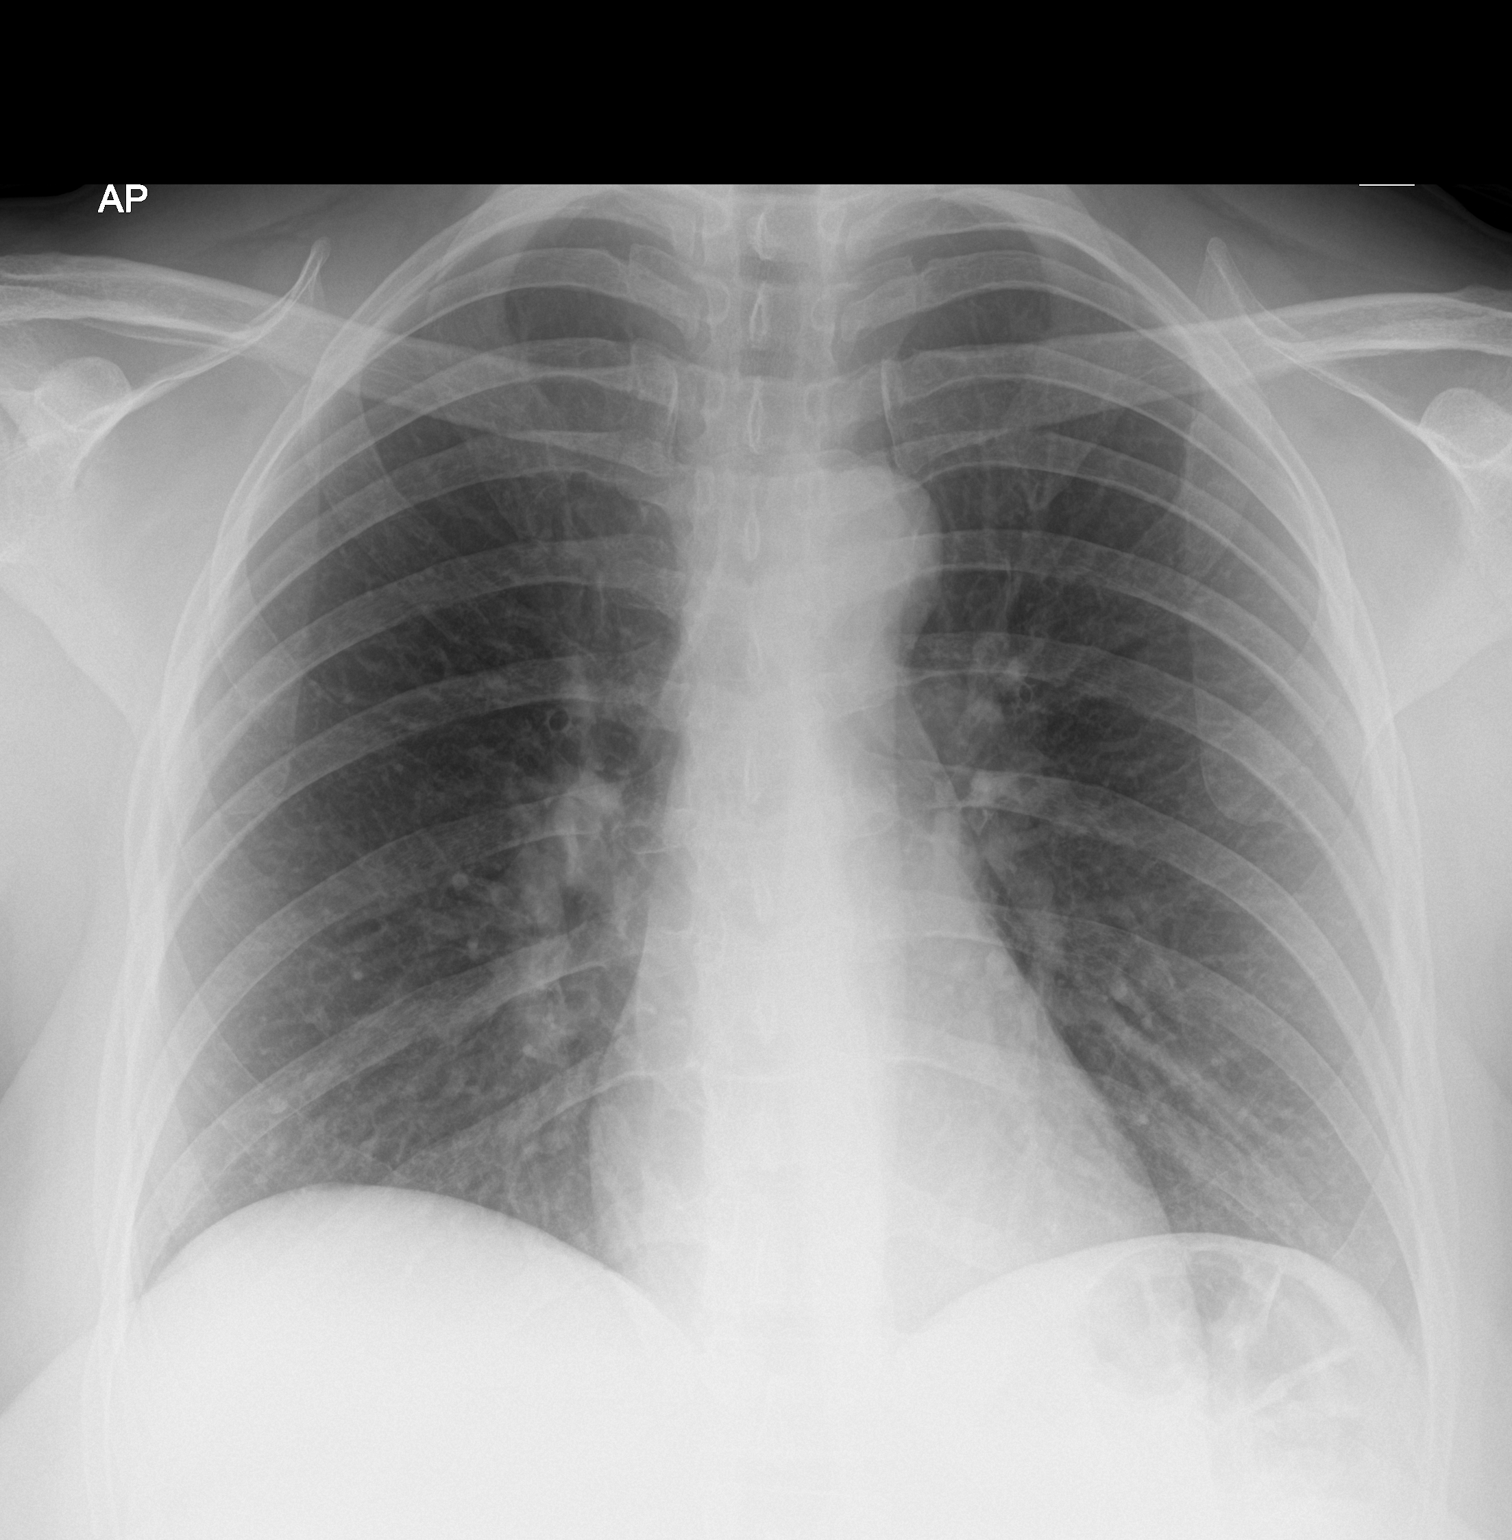

[1 of 1 positions shown; findings below may reference images not displayed]

FINDINGS: The heart size and mediastinal contours are within normal limits.
Both lungs are clear. The visualized skeletal structures are
unremarkable.
IMPRESSION: No active disease.

## 2023-11-21 ENCOUNTER — Encounter (HOSPITAL_COMMUNITY): Payer: Self-pay

## 2023-11-21 ENCOUNTER — Emergency Department (HOSPITAL_COMMUNITY)
Admission: EM | Admit: 2023-11-21 | Discharge: 2023-11-22 | Disposition: A | Discharge status: Home / Self Care | Payer: 59 | Attending: Emergency Medicine | Admitting: Emergency Medicine

## 2023-11-21 ENCOUNTER — Emergency Department (HOSPITAL_COMMUNITY): Payer: 59

## 2023-11-21 ENCOUNTER — Other Ambulatory Visit: Payer: Self-pay

## 2023-11-21 DIAGNOSIS — R079 Chest pain, unspecified: Secondary | ICD-10-CM | POA: Insufficient documentation

## 2023-11-21 DIAGNOSIS — Y92410 Unspecified street and highway as the place of occurrence of the external cause: Secondary | ICD-10-CM | POA: Diagnosis not present

## 2023-11-21 DIAGNOSIS — R109 Unspecified abdominal pain: Secondary | ICD-10-CM | POA: Insufficient documentation

## 2023-11-21 DIAGNOSIS — M79641 Pain in right hand: Secondary | ICD-10-CM | POA: Diagnosis not present

## 2023-11-21 DIAGNOSIS — S0990XA Unspecified injury of head, initial encounter: Secondary | ICD-10-CM | POA: Insufficient documentation

## 2023-11-21 DIAGNOSIS — M79604 Pain in right leg: Secondary | ICD-10-CM | POA: Insufficient documentation

## 2023-11-21 DIAGNOSIS — M25531 Pain in right wrist: Secondary | ICD-10-CM | POA: Diagnosis not present

## 2023-11-21 LAB — SAMPLE TO BLOOD BANK

## 2023-11-21 LAB — I-STAT CHEM 8, ED
BUN: 15 mg/dL (ref 6–20)
Calcium, Ion: 1.21 mmol/L (ref 1.15–1.40)
Chloride: 107 mmol/L (ref 98–111)
Creatinine, Ser: 1.2 mg/dL — ABNORMAL HIGH (ref 0.44–1.00)
Glucose, Bld: 109 mg/dL — ABNORMAL HIGH (ref 70–99)
HCT: 39 % (ref 36.0–46.0)
Hemoglobin: 13.3 g/dL (ref 12.0–15.0)
Potassium: 3.9 mmol/L (ref 3.5–5.1)
Sodium: 140 mmol/L (ref 135–145)
TCO2: 23 mmol/L (ref 22–32)

## 2023-11-21 LAB — I-STAT CG4 LACTIC ACID, ED: Lactic Acid, Venous: 0.6 mmol/L (ref 0.5–1.9)

## 2023-11-21 MED ORDER — HYDROMORPHONE HCL 1 MG/ML IJ SOLN
1.0000 mg | Freq: Once | INTRAMUSCULAR | Status: AC
  Administered 2023-11-22: 1 mg via INTRAVENOUS
  Filled 2023-11-21: qty 1
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
## 2023-11-22 ENCOUNTER — Emergency Department (HOSPITAL_COMMUNITY): Payer: 59

## 2023-11-22 LAB — PROTIME-INR
INR: 0.9 (ref 0.8–1.2)
Prothrombin Time: 12.7 s (ref 11.4–15.2)

## 2023-11-22 LAB — CBC
HCT: 36 % (ref 36.0–46.0)
Hemoglobin: 11.5 g/dL — ABNORMAL LOW (ref 12.0–15.0)
MCH: 23 pg — ABNORMAL LOW (ref 26.0–34.0)
MCHC: 31.9 g/dL (ref 30.0–36.0)
MCV: 72 fL — ABNORMAL LOW (ref 80.0–100.0)
Platelets: 384 10*3/uL (ref 150–400)
RBC: 5 MIL/uL (ref 3.87–5.11)
RDW: 16.3 % — ABNORMAL HIGH (ref 11.5–15.5)
WBC: 5.9 10*3/uL (ref 4.0–10.5)
nRBC: 0.5 % — ABNORMAL HIGH (ref 0.0–0.2)

## 2023-11-22 LAB — COMPREHENSIVE METABOLIC PANEL
ALT: 13 U/L (ref 0–44)
AST: 19 U/L (ref 15–41)
Albumin: 3.8 g/dL (ref 3.5–5.0)
Alkaline Phosphatase: 41 U/L (ref 38–126)
Anion gap: 7 (ref 5–15)
BUN: 13 mg/dL (ref 6–20)
CO2: 24 mmol/L (ref 22–32)
Calcium: 8.9 mg/dL (ref 8.9–10.3)
Chloride: 107 mmol/L (ref 98–111)
Creatinine, Ser: 1.05 mg/dL — ABNORMAL HIGH (ref 0.44–1.00)
GFR, Estimated: >60 mL/min (ref >60)
Glucose, Bld: 113 mg/dL — ABNORMAL HIGH (ref 70–99)
Potassium: 3.8 mmol/L (ref 3.5–5.1)
Sodium: 138 mmol/L (ref 135–145)
Total Bilirubin: 0.6 mg/dL (ref 0.0–1.2)
Total Protein: 6.5 g/dL (ref 6.5–8.1)

## 2023-11-22 LAB — ETHANOL: Alcohol, Ethyl (B): <10 mg/dL (ref <10)

## 2023-11-22 MED ORDER — BACITRACIN ZINC 500 UNIT/GM EX OINT
TOPICAL_OINTMENT | Freq: Two times a day (BID) | CUTANEOUS | Status: DC
Start: 2023-11-22 — End: 2023-11-22
  Filled 2023-11-22: qty 0.9

## 2023-11-22 MED ORDER — IOHEXOL 350 MG/ML SOLN
75.0000 mL | Freq: Once | INTRAVENOUS | Status: AC | PRN
  Administered 2023-11-22: 75 mL via INTRAVENOUS

## 2023-11-22 MED ORDER — CYCLOBENZAPRINE HCL 10 MG PO TABS: 10.0000 mg | ORAL_TABLET | Freq: Three times a day (TID) | ORAL | 0 refills | Status: AC | PRN

## 2023-11-22 MED ORDER — OXYCODONE-ACETAMINOPHEN 5-325 MG PO TABS
2.0000 | ORAL_TABLET | Freq: Once | ORAL | Status: AC
  Administered 2023-11-22: 2 via ORAL
  Filled 2023-11-22: qty 2

## 2023-12-22 ENCOUNTER — Other Ambulatory Visit: Payer: Self-pay | Admitting: Obstetrics and Gynecology

## 2023-12-22 DIAGNOSIS — R928 Other abnormal and inconclusive findings on diagnostic imaging of breast: Secondary | ICD-10-CM

## 2024-01-05 ENCOUNTER — Ambulatory Visit: Admission: RE | Admit: 2024-01-05 | Source: Ambulatory Visit

## 2024-01-05 ENCOUNTER — Ambulatory Visit
Admission: RE | Admit: 2024-01-05 | Discharge: 2024-01-05 | Disposition: A | Discharge status: Home / Self Care | Source: Ambulatory Visit | Attending: Obstetrics and Gynecology | Admitting: Obstetrics and Gynecology

## 2024-01-05 DIAGNOSIS — R928 Other abnormal and inconclusive findings on diagnostic imaging of breast: Secondary | ICD-10-CM

## 2024-02-04 ENCOUNTER — Encounter (HOSPITAL_BASED_OUTPATIENT_CLINIC_OR_DEPARTMENT_OTHER): Payer: Self-pay | Admitting: Emergency Medicine

## 2024-02-04 ENCOUNTER — Emergency Department (HOSPITAL_BASED_OUTPATIENT_CLINIC_OR_DEPARTMENT_OTHER)
Admission: EM | Admit: 2024-02-04 | Discharge: 2024-02-04 | Disposition: A | Discharge status: Home / Self Care | Attending: Emergency Medicine | Admitting: Emergency Medicine

## 2024-02-04 ENCOUNTER — Emergency Department (HOSPITAL_BASED_OUTPATIENT_CLINIC_OR_DEPARTMENT_OTHER)

## 2024-02-04 ENCOUNTER — Other Ambulatory Visit: Payer: Self-pay

## 2024-02-04 DIAGNOSIS — R1011 Right upper quadrant pain: Secondary | ICD-10-CM | POA: Insufficient documentation

## 2024-02-04 DIAGNOSIS — R110 Nausea: Secondary | ICD-10-CM | POA: Diagnosis not present

## 2024-02-04 DIAGNOSIS — E039 Hypothyroidism, unspecified: Secondary | ICD-10-CM | POA: Diagnosis not present

## 2024-02-04 LAB — CBC WITH DIFFERENTIAL/PLATELET
Abs Immature Granulocytes: 0.02 10*3/uL (ref 0.00–0.07)
Basophils Absolute: 0 10*3/uL (ref 0.0–0.1)
Basophils Relative: 1 %
Eosinophils Absolute: 0.2 10*3/uL (ref 0.0–0.5)
Eosinophils Relative: 4 %
HCT: 34 % — ABNORMAL LOW (ref 36.0–46.0)
Hemoglobin: 11.2 g/dL — ABNORMAL LOW (ref 12.0–15.0)
Immature Granulocytes: 0 %
Lymphocytes Relative: 24 %
Lymphs Abs: 1.4 10*3/uL (ref 0.7–4.0)
MCH: 23.2 pg — ABNORMAL LOW (ref 26.0–34.0)
MCHC: 32.9 g/dL (ref 30.0–36.0)
MCV: 70.4 fL — ABNORMAL LOW (ref 80.0–100.0)
Monocytes Absolute: 0.8 10*3/uL (ref 0.1–1.0)
Monocytes Relative: 13 %
Neutro Abs: 3.5 10*3/uL (ref 1.7–7.7)
Neutrophils Relative %: 58 %
Platelets: 262 10*3/uL (ref 150–400)
RBC: 4.83 MIL/uL (ref 3.87–5.11)
RDW: 15.8 % — ABNORMAL HIGH (ref 11.5–15.5)
Smear Review: NORMAL
WBC: 6 10*3/uL (ref 4.0–10.5)
nRBC: 0.3 % — ABNORMAL HIGH (ref 0.0–0.2)

## 2024-02-04 LAB — COMPREHENSIVE METABOLIC PANEL WITH GFR
ALT: 13 U/L (ref 0–44)
AST: 21 U/L (ref 15–41)
Albumin: 4.3 g/dL (ref 3.5–5.0)
Alkaline Phosphatase: 52 U/L (ref 38–126)
Anion gap: 12 (ref 5–15)
BUN: 14 mg/dL (ref 6–20)
CO2: 22 mmol/L (ref 22–32)
Calcium: 9 mg/dL (ref 8.9–10.3)
Chloride: 105 mmol/L (ref 98–111)
Creatinine, Ser: 0.82 mg/dL (ref 0.44–1.00)
GFR, Estimated: >60 mL/min (ref >60)
Glucose, Bld: 110 mg/dL — ABNORMAL HIGH (ref 70–99)
Potassium: 3.9 mmol/L (ref 3.5–5.1)
Sodium: 139 mmol/L (ref 135–145)
Total Bilirubin: 0.3 mg/dL (ref 0.0–1.2)
Total Protein: 7.1 g/dL (ref 6.5–8.1)

## 2024-02-04 LAB — LIPASE, BLOOD: Lipase: 53 U/L — ABNORMAL HIGH (ref 11–51)

## 2024-02-04 MED ORDER — MORPHINE SULFATE (PF) 4 MG/ML IV SOLN
4.0000 mg | Freq: Once | INTRAVENOUS | Status: AC
  Administered 2024-02-04: 4 mg via INTRAVENOUS
  Filled 2024-02-04: qty 1

## 2024-02-04 MED ORDER — ONDANSETRON 4 MG PO TBDP
4.0000 mg | ORAL_TABLET | Freq: Three times a day (TID) | ORAL | 0 refills | Status: DC | PRN
Start: 2024-02-04 — End: 2024-02-07

## 2024-02-04 MED ORDER — OXYCODONE-ACETAMINOPHEN 5-325 MG PO TABS
1.0000 | ORAL_TABLET | Freq: Once | ORAL | Status: AC
  Administered 2024-02-04: 1 via ORAL
  Filled 2024-02-04: qty 1

## 2024-02-04 MED ORDER — OXYCODONE-ACETAMINOPHEN 5-325 MG PO TABS: 1.0000 | ORAL_TABLET | Freq: Four times a day (QID) | ORAL | 0 refills | Status: DC | PRN

## 2024-02-04 MED ORDER — SENNOSIDES-DOCUSATE SODIUM 8.6-50 MG PO TABS: 1.0000 | ORAL_TABLET | Freq: Every evening | ORAL | 0 refills | Status: AC | PRN

## 2024-02-04 MED ORDER — ONDANSETRON HCL 4 MG/2ML IJ SOLN
4.0000 mg | Freq: Once | INTRAMUSCULAR | Status: AC
  Administered 2024-02-04: 4 mg via INTRAVENOUS
  Filled 2024-02-04: qty 2

## 2024-02-04 NOTE — ED Notes (Signed)
 Patient transported to Ultrasound
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
## 2024-02-04 NOTE — ED Triage Notes (Signed)
 Pt c/o RUQ pain that goes around to the back x 2 mos intermittently, worse x 2 wks; +nausea, pain is worse after eating

## 2024-02-04 NOTE — Discharge Instructions (Signed)
 He was seen in the emerged from today with abdominal pain.  I would like for you to call the general surgery office and schedule a follow-up appointment.  I have sent some pain medications and nausea medications over to your pharmacy.  Please eat nonfatty foods drink plenty of fluids.  If you develop worsening pain, vomiting, fever you should return to the emergency department.

## 2024-02-04 NOTE — ED Provider Notes (Signed)
 Emergency Department Provider Note   I have reviewed the triage vital signs and the nursing notes.   HISTORY  Chief Complaint Abdominal Pain   HPI Debbie [name] is a 43 y.o. female with past history reviewed below presents emergency department for evaluation of 2 months of intermittent right side abdominal pain.  Pain has been worsening over the past 2 weeks.  She finds symptoms are worse with eating and has some associated nausea.  No fevers.  No pain into the chest or shortness of breath. No radiation of symptoms.    Past Medical History:  Diagnosis Date   Anemia    Chest pain    Hypothyroidism    Lupus    Migraine    Migraines    Subconjunctival hemorrhage sm prob from vomiting 04/28/2012   Systemic lupus (HCC)    Thyroid  disease    UTI (urinary tract infection)    Vaginal delivery 2002, 2009, 2015   Varicose veins    Left > than right    Review of Systems  Constitutional: No fever/chills Cardiovascular: Denies chest pain. Respiratory: Denies shortness of breath. Gastrointestinal: Positive RUQ abdominal pain. Positive nausea, no vomiting.  No diarrhea.  No constipation. Skin: Negative for rash. Neurological: Negative for headaches.  ____________________________________________   PHYSICAL EXAM:  VITAL SIGNS: ED Triage Vitals  Encounter Vitals Group     BP 02/04/24 2001 108/65     Pulse Rate 02/04/24 2001 84     Resp 02/04/24 2001 18     Temp 02/04/24 2001 98.6 F (37 C)     Temp Source 02/04/24 2001 Oral     SpO2 02/04/24 2001 99 %     Weight 02/04/24 2001 176 lb 5.9 oz (80 kg)     Height 02/04/24 2001 5\' 2"  (1.575 m)   Constitutional: Alert and oriented. Well appearing and in no acute distress. Eyes: Conjunctivae are normal.  Head: Atraumatic. Nose: No congestion/rhinnorhea. Mouth/Throat: Mucous membranes are moist.   Neck: No stridor.  Cardiovascular: Normal rate, regular rhythm. Good peripheral circulation. Grossly normal heart sounds.    Respiratory: Normal respiratory effort.  No retractions. Lungs CTAB. Gastrointestinal: Soft with tenderness in the epigastric region and RUQ. No distention.  Musculoskeletal: No gross deformities of extremities. Neurologic:  Normal speech and language.  Skin:  Skin is warm, dry and intact. No rash noted.  ____________________________________________   LABS (all labs ordered are listed, but only abnormal results are displayed)  Labs Reviewed  COMPREHENSIVE METABOLIC PANEL WITH GFR - Abnormal; Notable for the following components:      Result Value   Glucose, Bld 110 (*)    All other components within normal limits  LIPASE, BLOOD - Abnormal; Notable for the following components:   Lipase 53 (*)    All other components within normal limits  CBC WITH DIFFERENTIAL/PLATELET - Abnormal; Notable for the following components:   Hemoglobin 11.2 (*)    HCT 34.0 (*)    MCV 70.4 (*)    MCH 23.2 (*)    RDW 15.8 (*)    nRBC 0.3 (*)    All other components within normal limits   ___________________________________________  RADIOLOGY  US  Abdomen Limited RUQ (LIVER/GB) Result Date: 02/04/2024 CLINICAL DATA:  Right upper quadrant pain radiating to the back, worse postprandial EXAM: ULTRASOUND ABDOMEN LIMITED RIGHT UPPER QUADRANT COMPARISON:  CT chest abdomen pelvis 11/22/2023 FINDINGS: Gallbladder: Decompressed gallbladder. The wall is not thickened measuring 2 mm. No pericholecystic fluid. There was a positive sonographic Abigail Abler  sign noted by the sonographer. Common bile duct: Diameter: 4 mm.  No intrahepatic biliary dilation. Liver: No focal lesion identified. Increased parenchymal echogenicity. Portal vein is patent on color Doppler imaging with normal direction of blood flow towards the liver. Other: None. IMPRESSION: 1. Decompressed gallbladder with positive sonographic Murphy sign. No gallbladder wall thickening or pericholecystic fluid. Findings are equivocal for acute cholecystitis. If there  is persistent clinical concern, consider further evaluation with HIDA scan. 2. Hepatic steatosis. Electronically Signed   By: Rozell Cornet M.D.   On: 02/04/2024 21:07    ____________________________________________   PROCEDURES  Procedure(s) performed:   Procedures  None  ____________________________________________   INITIAL IMPRESSION / ASSESSMENT AND PLAN / ED COURSE  Pertinent labs & imaging results that were available during my care of the patient were reviewed by me and considered in my medical decision making (see chart for details).   This patient is Presenting for Evaluation of RUQ abdominal pain, which does require a range of treatment options, and is a complaint that involves a high risk of morbidity and mortality.  The Differential Diagnoses includes but is not exclusive to acute cholecystitis, intrathoracic causes for epigastric abdominal pain, gastritis, duodenitis, pancreatitis, small bowel or large bowel obstruction, abdominal aortic aneurysm, hernia, gastritis, etc.   Critical Interventions-    Medications  morphine  (PF) 4 MG/ML injection 4 mg (4 mg Intravenous Given 02/04/24 2114)  ondansetron  (ZOFRAN ) injection 4 mg (4 mg Intravenous Given 02/04/24 2114)  oxyCODONE -acetaminophen  (PERCOCET/ROXICET) 5-325 MG per tablet 1 tablet (1 tablet Oral Given 02/04/24 2200)    Reassessment after intervention: pain improved.    Clinical Laboratory Tests Ordered, included CBC without leukocytosis.  Mild anemia at 11.2.  Lipase minimally elevated at 53 with normal bilirubin and LFTs.  Radiologic Tests Ordered, included RUQ US . I independently interpreted the images and agree with radiology interpretation.   Cardiac Monitor Tracing which shows NSR.    Social Determinants of Health Risk patient is a non-smoker.   Medical Decision Making: Summary:  Patient presents emergency department valuation of right upper quadrant abdominal pain.  This been ongoing for the past 2 months  intermittently.  Tenderness in the right upper quadrant.  Labs are reassuring.  Ultrasound equivocal for acute cholecystitis but no gallbladder inflammation, pericholecystic fluid, wall thickening.  Reevaluation with update and discussion with patient.  She is feeling improved after pain medication. Offered admit but patient would like to try at home. May ultimately benefit from close follow-up with PCP and general surgery.  Will provide contact information at discharge with strict ED return precautions should symptoms worsen or she develop fever.  Patient's presentation is most consistent with acute presentation with potential threat to life or bodily function.   Disposition: discharge  ____________________________________________  FINAL CLINICAL IMPRESSION(S) / ED DIAGNOSES  Final diagnoses:  RUQ abdominal pain     NEW OUTPATIENT MEDICATIONS STARTED DURING THIS VISIT:  Discharge Medication List as of 02/04/2024 11:06 PM     START taking these medications   Details  senna-docusate (SENOKOT-S) 8.6-50 MG tablet Take 1 tablet by mouth at bedtime as needed for mild constipation., Starting Wed 02/04/2024, Normal    ondansetron  (ZOFRAN -ODT) 4 MG disintegrating tablet Take 1 tablet (4 mg total) by mouth every 8 (eight) hours as needed., Starting Wed 02/04/2024, Normal    oxyCODONE -acetaminophen  (PERCOCET/ROXICET) 5-325 MG tablet Take 1 tablet by mouth every 6 (six) hours as needed for severe pain (pain score 7-10)., Starting Wed 02/04/2024, Normal  Note:  This document was prepared using Dragon voice recognition software and may include unintentional dictation errors.  Abby Hocking, MD, [facility]    Harvie Morua, Shereen Dike, MD 02/07/24 2352

## 2024-02-05 ENCOUNTER — Ambulatory Visit (HOSPITAL_BASED_OUTPATIENT_CLINIC_OR_DEPARTMENT_OTHER): Admitting: Anesthesiology

## 2024-02-05 ENCOUNTER — Ambulatory Visit (HOSPITAL_COMMUNITY): Admitting: Anesthesiology

## 2024-02-05 ENCOUNTER — Encounter (HOSPITAL_COMMUNITY): Payer: Self-pay

## 2024-02-05 ENCOUNTER — Observation Stay (HOSPITAL_COMMUNITY)
Admission: RE | Admit: 2024-02-05 | Discharge: 2024-02-07 | Disposition: A | Discharge status: Home / Self Care | Source: Ambulatory Visit | Attending: General Surgery | Admitting: General Surgery

## 2024-02-05 ENCOUNTER — Encounter (HOSPITAL_COMMUNITY): Payer: Self-pay | Admitting: General Surgery

## 2024-02-05 ENCOUNTER — Other Ambulatory Visit: Payer: Self-pay

## 2024-02-05 ENCOUNTER — Encounter (HOSPITAL_COMMUNITY): Admission: RE | Disposition: A | Discharge status: Home / Self Care | Payer: Self-pay | Source: Ambulatory Visit

## 2024-02-05 ENCOUNTER — Ambulatory Visit: Payer: Self-pay | Admitting: General Surgery

## 2024-02-05 DIAGNOSIS — K810 Acute cholecystitis: Principal | ICD-10-CM | POA: Diagnosis present

## 2024-02-05 DIAGNOSIS — K819 Cholecystitis, unspecified: Secondary | ICD-10-CM

## 2024-02-05 DIAGNOSIS — J3089 Other allergic rhinitis: Secondary | ICD-10-CM | POA: Diagnosis present

## 2024-02-05 DIAGNOSIS — K219 Gastro-esophageal reflux disease without esophagitis: Secondary | ICD-10-CM | POA: Insufficient documentation

## 2024-02-05 DIAGNOSIS — Z8709 Personal history of other diseases of the respiratory system: Secondary | ICD-10-CM

## 2024-02-05 DIAGNOSIS — G43909 Migraine, unspecified, not intractable, without status migrainosus: Secondary | ICD-10-CM | POA: Diagnosis present

## 2024-02-05 DIAGNOSIS — E039 Hypothyroidism, unspecified: Secondary | ICD-10-CM | POA: Insufficient documentation

## 2024-02-05 DIAGNOSIS — Z889 Allergy status to unspecified drugs, medicaments and biological substances status: Secondary | ICD-10-CM

## 2024-02-05 DIAGNOSIS — E66811 Obesity, class 1: Secondary | ICD-10-CM | POA: Diagnosis present

## 2024-02-05 DIAGNOSIS — Z6791 Unspecified blood type, Rh negative: Secondary | ICD-10-CM | POA: Insufficient documentation

## 2024-02-05 DIAGNOSIS — Z79899 Other long term (current) drug therapy: Secondary | ICD-10-CM | POA: Diagnosis not present

## 2024-02-05 DIAGNOSIS — K811 Chronic cholecystitis: Secondary | ICD-10-CM | POA: Diagnosis present

## 2024-02-05 DIAGNOSIS — R1110 Vomiting, unspecified: Secondary | ICD-10-CM | POA: Insufficient documentation

## 2024-02-05 DIAGNOSIS — M329 Systemic lupus erythematosus, unspecified: Secondary | ICD-10-CM | POA: Diagnosis present

## 2024-02-05 HISTORY — PX: CHOLECYSTECTOMY: SHX55

## 2024-02-05 LAB — HIV ANTIBODY (ROUTINE TESTING W REFLEX): HIV Screen 4th Generation wRfx: NONREACTIVE

## 2024-02-05 SURGERY — LAPAROSCOPIC CHOLECYSTECTOMY
Anesthesia: General | Laterality: Left

## 2024-02-05 MED ORDER — MENTHOL 3 MG MT LOZG: 1.0000 | LOZENGE | OROMUCOSAL | Status: DC | PRN

## 2024-02-05 MED ORDER — 0.9 % SODIUM CHLORIDE (POUR BTL) OPTIME
TOPICAL | Status: DC | PRN
  Administered 2024-02-05: 1000 mL

## 2024-02-05 MED ORDER — CYCLOBENZAPRINE HCL 10 MG PO TABS
10.0000 mg | ORAL_TABLET | Freq: Three times a day (TID) | ORAL | Status: DC | PRN
  Administered 2024-02-05: 10 mg via ORAL
  Filled 2024-02-05: qty 1

## 2024-02-05 MED ORDER — PANTOPRAZOLE SODIUM 40 MG PO TBEC
40.0000 mg | DELAYED_RELEASE_TABLET | Freq: Every day | ORAL | Status: DC
  Administered 2024-02-05 – 2024-02-07 (×3): 40 mg via ORAL
  Filled 2024-02-05 (×3): qty 1

## 2024-02-05 MED ORDER — HYDROCERIN EX CREA
TOPICAL_CREAM | Freq: Two times a day (BID) | CUTANEOUS | Status: DC
  Filled 2024-02-05: qty 113

## 2024-02-05 MED ORDER — ACETAMINOPHEN 500 MG PO TABS: 1000.0000 mg | ORAL_TABLET | Freq: Three times a day (TID) | ORAL | Status: AC | PRN

## 2024-02-05 MED ORDER — LIDOCAINE HCL (CARDIAC) PF 100 MG/5ML IV SOSY
PREFILLED_SYRINGE | INTRAVENOUS | Status: DC | PRN
  Administered 2024-02-05: 100 mg via INTRAVENOUS

## 2024-02-05 MED ORDER — HYDROCODONE-ACETAMINOPHEN 5-325 MG PO TABS
1.0000 | ORAL_TABLET | ORAL | Status: DC | PRN
  Administered 2024-02-05 – 2024-02-06 (×4): 2 via ORAL
  Filled 2024-02-05 (×4): qty 2

## 2024-02-05 MED ORDER — HYDROMORPHONE HCL 1 MG/ML IJ SOLN
1.0000 mg | Freq: Three times a day (TID) | INTRAMUSCULAR | Status: DC | PRN
  Administered 2024-02-05 – 2024-02-06 (×3): 1 mg via INTRAVENOUS
  Filled 2024-02-05 (×3): qty 1

## 2024-02-05 MED ORDER — LORAZEPAM 0.5 MG PO TABS: 0.5000 mg | ORAL_TABLET | Freq: Four times a day (QID) | ORAL | Status: DC | PRN

## 2024-02-05 MED ORDER — FENTANYL CITRATE (PF) 100 MCG/2ML IJ SOLN
INTRAMUSCULAR | Status: DC | PRN
  Administered 2024-02-05: 50 ug via INTRAVENOUS
  Administered 2024-02-05: 100 ug via INTRAVENOUS

## 2024-02-05 MED ORDER — KCL IN DEXTROSE-NACL 40-5-0.9 MEQ/L-%-% IV SOLN
INTRAVENOUS | Status: AC
Start: 2024-02-05 — End: 2024-02-06

## 2024-02-05 MED ORDER — PHENOL 1.4 % MT LIQD: 2.0000 | OROMUCOSAL | Status: DC | PRN

## 2024-02-05 MED ORDER — PROPOFOL 10 MG/ML IV BOLUS
INTRAVENOUS | Status: DC | PRN
  Administered 2024-02-05: 140 mg via INTRAVENOUS

## 2024-02-05 MED ORDER — ONDANSETRON HCL 4 MG/2ML IJ SOLN
4.0000 mg | Freq: Four times a day (QID) | INTRAMUSCULAR | Status: DC | PRN
  Administered 2024-02-06: 4 mg via INTRAVENOUS
  Filled 2024-02-05 (×2): qty 2

## 2024-02-05 MED ORDER — KETOROLAC TROMETHAMINE 15 MG/ML IJ SOLN: 15.0000 mg | Freq: Once | INTRAMUSCULAR | Status: AC | PRN

## 2024-02-05 MED ORDER — INDOCYANINE GREEN 25 MG IV SOLR
2.5000 mg | Freq: Once | INTRAVENOUS | Status: AC
Start: 2024-02-05 — End: 2024-02-05
  Administered 2024-02-05: 2.5 mg via TOPICAL
  Filled 2024-02-05: qty 10

## 2024-02-05 MED ORDER — BUPIVACAINE-EPINEPHRINE (PF) 0.25% -1:200000 IJ SOLN
INTRAMUSCULAR | Status: AC
  Filled 2024-02-05: qty 30

## 2024-02-05 MED ORDER — OXYCODONE HCL 5 MG PO TABS: 5.0000 mg | ORAL_TABLET | ORAL | Status: DC | PRN

## 2024-02-05 MED ORDER — ONDANSETRON HCL 4 MG/2ML IJ SOLN
INTRAMUSCULAR | Status: AC
  Filled 2024-02-05: qty 2

## 2024-02-05 MED ORDER — METHOCARBAMOL 1000 MG/10ML IJ SOLN
1000.0000 mg | Freq: Four times a day (QID) | INTRAMUSCULAR | Status: DC | PRN
  Administered 2024-02-06: 1000 mg via INTRAVENOUS
  Filled 2024-02-05: qty 10

## 2024-02-05 MED ORDER — DIPHENHYDRAMINE HCL 25 MG PO CAPS: 25.0000 mg | ORAL_CAPSULE | Freq: Four times a day (QID) | ORAL | Status: DC | PRN

## 2024-02-05 MED ORDER — SALINE SPRAY 0.65 % NA SOLN: 1.0000 | Freq: Four times a day (QID) | NASAL | Status: DC | PRN

## 2024-02-05 MED ORDER — GUAIFENESIN-DM 100-10 MG/5ML PO SYRP: 15.0000 mL | ORAL_SOLUTION | ORAL | Status: DC | PRN

## 2024-02-05 MED ORDER — ONDANSETRON 4 MG PO TBDP
4.0000 mg | ORAL_TABLET | Freq: Four times a day (QID) | ORAL | Status: AC | PRN
Start: 2024-02-05 — End: ?

## 2024-02-05 MED ORDER — FENTANYL CITRATE PF 50 MCG/ML IJ SOSY
PREFILLED_SYRINGE | INTRAMUSCULAR | Status: AC
  Administered 2024-02-05: 50 ug via INTRAVENOUS
  Filled 2024-02-05: qty 3

## 2024-02-05 MED ORDER — MAGIC MOUTHWASH: 15.0000 mL | Freq: Four times a day (QID) | ORAL | Status: DC | PRN

## 2024-02-05 MED ORDER — PHENYLEPHRINE 80 MCG/ML (10ML) SYRINGE FOR IV PUSH (FOR BLOOD PRESSURE SUPPORT)
PREFILLED_SYRINGE | INTRAVENOUS | Status: DC | PRN
  Administered 2024-02-05 (×3): 80 ug via INTRAVENOUS

## 2024-02-05 MED ORDER — ACETAMINOPHEN 500 MG PO TABS: 1000.0000 mg | ORAL_TABLET | Freq: Four times a day (QID) | ORAL | Status: DC

## 2024-02-05 MED ORDER — KETOROLAC TROMETHAMINE 15 MG/ML IJ SOLN
INTRAMUSCULAR | Status: AC
  Administered 2024-02-05: 15 mg via INTRAVENOUS
  Filled 2024-02-05: qty 1

## 2024-02-05 MED ORDER — LEVOTHYROXINE SODIUM 75 MCG PO TABS
150.0000 ug | ORAL_TABLET | Freq: Every day | ORAL | Status: DC
  Administered 2024-02-06 – 2024-02-07 (×2): 150 ug via ORAL
  Filled 2024-02-05 (×2): qty 2

## 2024-02-05 MED ORDER — CEFAZOLIN SODIUM-DEXTROSE 2-4 GM/100ML-% IV SOLN
INTRAVENOUS | Status: AC
  Filled 2024-02-05: qty 100

## 2024-02-05 MED ORDER — ACETAMINOPHEN 10 MG/ML IV SOLN
INTRAVENOUS | Status: AC
  Filled 2024-02-05: qty 100

## 2024-02-05 MED ORDER — ONDANSETRON HCL 4 MG/2ML IJ SOLN
INTRAMUSCULAR | Status: DC | PRN
  Administered 2024-02-05 (×2): 2 mg via INTRAVENOUS

## 2024-02-05 MED ORDER — ENOXAPARIN SODIUM 40 MG/0.4ML IJ SOSY
40.0000 mg | PREFILLED_SYRINGE | INTRAMUSCULAR | Status: DC
  Administered 2024-02-06 – 2024-02-07 (×2): 40 mg via SUBCUTANEOUS
  Filled 2024-02-05 (×2): qty 0.4

## 2024-02-05 MED ORDER — ALBUTEROL SULFATE (2.5 MG/3ML) 0.083% IN NEBU: 2.5000 mg | INHALATION_SOLUTION | Freq: Four times a day (QID) | RESPIRATORY_TRACT | Status: DC | PRN

## 2024-02-05 MED ORDER — METOPROLOL TARTRATE 5 MG/5ML IV SOLN: 5.0000 mg | Freq: Four times a day (QID) | INTRAVENOUS | Status: DC | PRN

## 2024-02-05 MED ORDER — HYDROMORPHONE HCL 2 MG/ML IJ SOLN: 1.0000 mg | INTRAMUSCULAR | Status: DC | PRN

## 2024-02-05 MED ORDER — OXYCODONE HCL 5 MG PO TABS: 5.0000 mg | ORAL_TABLET | Freq: Four times a day (QID) | ORAL | 0 refills | Status: DC | PRN

## 2024-02-05 MED ORDER — POLYETHYLENE GLYCOL 3350 17 G PO PACK: 17.0000 g | PACK | Freq: Every day | ORAL | Status: DC | PRN

## 2024-02-05 MED ORDER — LACTATED RINGERS IR SOLN
Status: DC | PRN
  Administered 2024-02-05: 1000 mL

## 2024-02-05 MED ORDER — BISMUTH SUBSALICYLATE 262 MG/15ML PO SUSP: 30.0000 mL | Freq: Three times a day (TID) | ORAL | Status: DC | PRN

## 2024-02-05 MED ORDER — FENTANYL CITRATE PF 50 MCG/ML IJ SOSY
50.0000 ug | PREFILLED_SYRINGE | Freq: Once | INTRAMUSCULAR | Status: AC
  Administered 2024-02-05: 50 ug via INTRAVENOUS
  Filled 2024-02-05: qty 1

## 2024-02-05 MED ORDER — ACETAMINOPHEN 10 MG/ML IV SOLN
1000.0000 mg | Freq: Once | INTRAVENOUS | Status: DC | PRN
  Administered 2024-02-05: 1000 mg via INTRAVENOUS

## 2024-02-05 MED ORDER — CEFAZOLIN SODIUM 1 G IJ SOLR: 2.0000 g | Freq: Three times a day (TID) | INTRAMUSCULAR | Status: AC

## 2024-02-05 MED ORDER — DIPHENHYDRAMINE HCL 50 MG/ML IJ SOLN: 12.5000 mg | Freq: Four times a day (QID) | INTRAMUSCULAR | Status: DC | PRN

## 2024-02-05 MED ORDER — CEFAZOLIN SODIUM-DEXTROSE 2-4 GM/100ML-% IV SOLN
2.0000 g | Freq: Once | INTRAVENOUS | Status: AC
  Administered 2024-02-05: 2 g via INTRAVENOUS

## 2024-02-05 MED ORDER — SUGAMMADEX SODIUM 200 MG/2ML IV SOLN
INTRAVENOUS | Status: DC | PRN
  Administered 2024-02-05: 200 mg via INTRAVENOUS

## 2024-02-05 MED ORDER — IBUPROFEN 400 MG PO TABS
800.0000 mg | ORAL_TABLET | Freq: Four times a day (QID) | ORAL | Status: DC
  Administered 2024-02-05 – 2024-02-06 (×2): 800 mg via ORAL
  Filled 2024-02-05 (×2): qty 2

## 2024-02-05 MED ORDER — HYDRALAZINE HCL 20 MG/ML IJ SOLN: 10.0000 mg | INTRAMUSCULAR | Status: DC | PRN

## 2024-02-05 MED ORDER — METOCLOPRAMIDE HCL 5 MG/ML IJ SOLN: 5.0000 mg | Freq: Three times a day (TID) | INTRAMUSCULAR | Status: DC | PRN

## 2024-02-05 MED ORDER — ROCURONIUM BROMIDE 100 MG/10ML IV SOLN
INTRAVENOUS | Status: DC | PRN
  Administered 2024-02-05: 40 mg via INTRAVENOUS

## 2024-02-05 MED ORDER — BISACODYL 10 MG RE SUPP: 10.0000 mg | Freq: Two times a day (BID) | RECTAL | Status: DC | PRN

## 2024-02-05 MED ORDER — OXYCODONE HCL 5 MG/5ML PO SOLN: 5.0000 mg | Freq: Once | ORAL | Status: DC | PRN

## 2024-02-05 MED ORDER — ALUM & MAG HYDROXIDE-SIMETH 200-200-20 MG/5ML PO SUSP
30.0000 mL | Freq: Four times a day (QID) | ORAL | Status: DC | PRN
  Administered 2024-02-06 (×2): 30 mL via ORAL
  Filled 2024-02-05: qty 30

## 2024-02-05 MED ORDER — MIDAZOLAM HCL 5 MG/5ML IJ SOLN
INTRAMUSCULAR | Status: DC | PRN
  Administered 2024-02-05: 2 mg via INTRAVENOUS

## 2024-02-05 MED ORDER — LACTATED RINGERS IV SOLN: INTRAVENOUS | Status: DC

## 2024-02-05 MED ORDER — ACETAMINOPHEN 500 MG PO TABS
500.0000 mg | ORAL_TABLET | Freq: Four times a day (QID) | ORAL | Status: DC
  Administered 2024-02-05: 500 mg via ORAL
  Filled 2024-02-05 (×2): qty 1

## 2024-02-05 MED ORDER — OXYCODONE HCL 5 MG PO TABS: 5.0000 mg | ORAL_TABLET | Freq: Once | ORAL | Status: DC | PRN

## 2024-02-05 MED ORDER — CHLORHEXIDINE GLUCONATE 0.12 % MT SOLN
15.0000 mL | Freq: Once | OROMUCOSAL | Status: AC
  Administered 2024-02-05: 15 mL via OROMUCOSAL

## 2024-02-05 MED ORDER — FENTANYL CITRATE (PF) 100 MCG/2ML IJ SOLN
INTRAMUSCULAR | Status: AC
Start: 2024-02-05 — End: ?
  Filled 2024-02-05: qty 2

## 2024-02-05 MED ORDER — SODIUM CHLORIDE 0.9 % IV SOLN: 4.0000 mg | Freq: Four times a day (QID) | INTRAVENOUS | Status: AC | PRN

## 2024-02-05 MED ORDER — ONDANSETRON HCL 4 MG/2ML IJ SOLN
4.0000 mg | Freq: Once | INTRAMUSCULAR | Status: AC | PRN
  Administered 2024-02-05: 4 mg via INTRAVENOUS

## 2024-02-05 MED ORDER — METHOCARBAMOL 500 MG PO TABS: 1000.0000 mg | ORAL_TABLET | Freq: Four times a day (QID) | ORAL | Status: DC | PRN

## 2024-02-05 MED ORDER — SUCCINYLCHOLINE CHLORIDE 200 MG/10ML IV SOSY
PREFILLED_SYRINGE | INTRAVENOUS | Status: DC | PRN
  Administered 2024-02-05: 120 mg via INTRAVENOUS

## 2024-02-05 MED ORDER — PROCHLORPERAZINE MALEATE 5 MG PO TABS
5.0000 mg | ORAL_TABLET | Freq: Four times a day (QID) | ORAL | Status: DC | PRN
  Administered 2024-02-05: 10 mg via ORAL
  Filled 2024-02-05: qty 10
  Filled 2024-02-05: qty 2

## 2024-02-05 MED ORDER — NAPHAZOLINE-GLYCERIN 0.012-0.25 % OP SOLN
1.0000 [drp] | Freq: Four times a day (QID) | OPHTHALMIC | Status: DC | PRN
Start: 2024-02-05 — End: 2024-02-07

## 2024-02-05 MED ORDER — FENTANYL CITRATE PF 50 MCG/ML IJ SOSY
25.0000 ug | PREFILLED_SYRINGE | INTRAMUSCULAR | Status: DC | PRN
  Administered 2024-02-05 (×2): 50 ug via INTRAVENOUS

## 2024-02-05 MED ORDER — SIMETHICONE 40 MG/0.6ML PO SUSP: 80.0000 mg | Freq: Four times a day (QID) | ORAL | Status: DC | PRN

## 2024-02-05 MED ORDER — FENTANYL CITRATE (PF) 250 MCG/5ML IJ SOLN
INTRAMUSCULAR | Status: AC
  Filled 2024-02-05: qty 5

## 2024-02-05 MED ORDER — ORAL CARE MOUTH RINSE: 15.0000 mL | Freq: Once | OROMUCOSAL | Status: AC

## 2024-02-05 MED ORDER — BUPIVACAINE-EPINEPHRINE 0.25% -1:200000 IJ SOLN
INTRAMUSCULAR | Status: DC | PRN
  Administered 2024-02-05: 30 mL

## 2024-02-05 MED ORDER — DEXAMETHASONE SODIUM PHOSPHATE 10 MG/ML IJ SOLN
INTRAMUSCULAR | Status: DC | PRN
  Administered 2024-02-05: 5 mg via INTRAVENOUS

## 2024-02-05 MED ORDER — HYDROXYZINE PAMOATE 25 MG PO CAPS: 25.0000 mg | ORAL_CAPSULE | Freq: Every evening | ORAL | Status: DC | PRN

## 2024-02-05 SURGICAL SUPPLY — 35 items
BAG COUNTER SPONGE SURGICOUNT (BAG) ×1 IMPLANT
CHLORAPREP W/TINT 26 (MISCELLANEOUS) ×1 IMPLANT
CLIP LIGATING HEMO O LOK GREEN (MISCELLANEOUS) ×1 IMPLANT
COVER MAYO STAND XLG (MISCELLANEOUS) IMPLANT
COVER SURGICAL LIGHT HANDLE (MISCELLANEOUS) ×1 IMPLANT
DERMABOND ADVANCED .7 DNX12 (GAUZE/BANDAGES/DRESSINGS) ×1 IMPLANT
DRAPE C-ARM 42X120 X-RAY (DRAPES) IMPLANT
DRAPE CV SPLIT W-CLR ANES SCRN (DRAPES) ×1 IMPLANT
DRAPE PERI GROIN 82X75IN TIB (DRAPES) ×1 IMPLANT
ELECT REM PT RETURN 15FT ADLT (MISCELLANEOUS) ×1 IMPLANT
GLOVE INDICATOR 6.5 STRL GRN (GLOVE) ×1 IMPLANT
GLOVE SURG MICRO LTX SZ6 (GLOVE) ×1 IMPLANT
GOWN STRL REUS W/ TWL LRG LVL3 (GOWN DISPOSABLE) ×1 IMPLANT
GRASPER SUT TROCAR 14GX15 (MISCELLANEOUS) ×1 IMPLANT
IRRIGATION SUCT STRKRFLW 2 WTP (MISCELLANEOUS) ×1 IMPLANT
KIT BASIN OR (CUSTOM PROCEDURE TRAY) ×1 IMPLANT
KIT IMAGING PINPOINTPAQ (MISCELLANEOUS) IMPLANT
KIT TURNOVER KIT A (KITS) ×1 IMPLANT
LHOOK LAP DISP 36CM (ELECTROSURGICAL) ×1 IMPLANT
NDL INSUFFLATION 14GA 120MM (NEEDLE) ×1 IMPLANT
NEEDLE INSUFFLATION 14GA 120MM (NEEDLE) ×1 IMPLANT
NS IRRIG 1000ML POUR BTL (IV SOLUTION) ×1 IMPLANT
PENCIL BUTTON HOLSTER BLD 10FT (ELECTRODE) ×1 IMPLANT
POUCH LAPAROSCOPIC INSTRUMENT (MISCELLANEOUS) IMPLANT
SCISSORS LAP 5X35 DISP (ENDOMECHANICALS) ×1 IMPLANT
SET CHOLANGIOGRAPH MIX (MISCELLANEOUS) IMPLANT
SET TUBE SMOKE EVAC HIGH FLOW (TUBING) ×1 IMPLANT
SLEEVE Z-THREAD 5X100MM (TROCAR) ×2 IMPLANT
SUT MNCRL AB 4-0 PS2 18 (SUTURE) ×1 IMPLANT
SYSTEM BAG RETRIEVAL 10MM (BASKET) ×1 IMPLANT
TOWEL GREEN STERILE FF (TOWEL DISPOSABLE) ×1 IMPLANT
TRAY LAPAROSCOPIC (CUSTOM PROCEDURE TRAY) ×1 IMPLANT
TROCAR 11X100 Z THREAD (TROCAR) IMPLANT
TROCAR Z THREAD OPTICAL 12X100 (TROCAR) ×1 IMPLANT
TROCAR Z-THREAD OPTICAL 5X100M (TROCAR) ×1 IMPLANT

## 2024-02-05 NOTE — Op Note (Signed)
 02/05/2024 4:10 PM  PATIENT: Debbie [name]  43 y.o. female   PRE-OPERATIVE DIAGNOSIS: acute  cholecystitis  POST-OPERATIVE DIAGNOSIS: chronic cholecystitis  PROCEDURE: laparoscopic cholecystectomy with ICG  SURGEON: Freddrick Jaffe, MD  ASSISTANT: None  ANESTHESIA: General endotracheal  EBL: 2cc  DRAINS: None  SPECIMEN: Gallbladder  COUNTS: Sponge, needle and instrument counts were reported correct x2 at the conclusion of the operation  DISPOSITION: PACU in satisfactory condition  COMPLICATIONS: None  FINDINGS: Chronically inflamed gallbladder, no evidence of stones  DESCRIPTION:  The patient was identified & brought into the operating room. She was then positioned supine on the OR table. SCDs were in place and active during the entire case. She then underwent general endotracheal anesthesia. Pressure points were padded. Hair on the abdomen was clipped by the OR team. The abdomen was prepped and draped in the standard sterile fashion. Antibiotics were administered. A surgical timeout was performed and confirmed our plan.   A small incision was made in the LUQ at Palmer's point and a veress needle was inserted. Air was aspirated and subsequent positive drop test. Abdomen then insufflated to . A periumbilical incision was then made and a 5mm trocar optiview using a 30 degree scope was inserted and the abdomen was entered under direct visualization. Inspection confirmed no evidence of trocar or veress site complications. The veress was then removed.   The patient was then positioned in reverse Trendelenburg with slight left side down. A 12 mm supxiphoid trocar was placed under direct visualization and  two additional 5mm trocars were placed along the right subcostal line - one 5mm port in mid subcostal region, another 5mm port in the right flank near the anterior axillary line.  The liver and gallbladder were inspected. Chronic inflammation of gallbladder noted. The gallbladder  fundus was grasped and elevated cephalad. An additional grasper was then placed on the infundibulum of the gallbladder and the infundibulum was retracted laterally. Staying high on the gallbladder, the peritoneum on both sides of the gallbladder was opened with hook cautery. Gentle blunt dissection was then employed with a [place]  dissector working down into [employer]. The cystic duct was identified and carefully circumferentially dissected. The cystic artery was also identified and carefully circumferentially dissected. The space between the cystic artery and hepatocystic plate was developed such that a good view of the liver could be seen through a window medial to the cystic artery. The triangle of Calot had been cleared of all fibrofatty tissue. At this point, a critical view of safety was achieved and the only structures visualized was the skeletonized cystic duct laterally, the skeletonized cystic artery and the liver through the window medial to the artery. No posterior cystic artery was noted  ICG employed prior to clip placement and confirmed normal anatomy. The cystic duct and artery were clipped with 2 hemolock clips on the patient side and 1 clip on the specimen side. The cystic duct and artery were then divided. The gallbladder was then freed from its remaining attachments to the liver using electrocautery and placed into an endocatch bag. The RUQ was gently irrigated with sterile saline. Hemostasis was then verified. The clips were in good position; the gallbladder fossa was dry. The rest of the abdomen was inspected no injury nor bleeding elsewhere was identified.  The endocatch bag containing the gallbladder was then removed from the subxiphoid port site and passed off as specimen. The subxiphoid port fascia was then closed in a figured of eight fashion with 0 vicryl using a  suture passer. The RUQ ports were removed under direct visualization and noted to be hemostatic.Aaron Aas The fascia was  palpated and noted to be completely closed. The abdomen was then desufflated and the periumbilical trocar removed. The skin of all incision sites was approximated with 4-0 monocryl subcuticular suture and dermabond applied. She was then awakened from anesthesia, extubated, and transferred to a stretcher for transport to PACU in satisfactory condition.  Instrument, sponge, and needle counts were correct at closure and at the conclusion of the case.   Freddrick Jaffe, MD [facility]

## 2024-02-05 NOTE — Progress Notes (Signed)
 Driver is aunt, Debbie [name], speaks spanish only.  Pt speaks English.

## 2024-02-05 NOTE — Discharge Instructions (Signed)

## 2024-02-05 NOTE — Anesthesia Procedure Notes (Signed)
 Procedure Name: Intubation Date/Time: 02/05/2024 3:08 PM  Performed by: Mervyn Ace, CRNAPre-anesthesia Checklist: Patient identified, Emergency Drugs available, Suction available, Patient being monitored and Timeout performed Patient Re-evaluated:Patient Re-evaluated prior to induction Oxygen Delivery Method: Circle system utilized Preoxygenation: Pre-oxygenation with 100% oxygen Induction Type: Rapid sequence, IV induction and Cricoid Pressure applied Laryngoscope Size: Mac and 3 Grade View: Grade I Tube type: Oral Tube size: 7.0 mm Number of attempts: 1 Airway Equipment and Method: Stylet Placement Confirmation: ETT inserted through vocal cords under direct vision, positive ETCO2, CO2 detector and breath sounds checked- equal and bilateral Secured at: 22 cm Tube secured with: Tape (pink Hy-tape used) Dental Injury: Teeth and Oropharynx as per pre-operative assessment

## 2024-02-05 NOTE — Interval H&P Note (Addendum)
 History and Physical Interval Note:  02/05/2024 2:10 PM  Debbie [name]  has presented today for surgery, with the diagnosis of CHOLECYSTITIS.  The various methods of treatment have been discussed with the patient and family. After consideration of risks, benefits and other options for treatment, the patient has consented to  Procedure(s) with comments: LAPAROSCOPIC CHOLECYSTECTOMY (Left) - WITH ICG as a surgical intervention.  The patient's history has been reviewed, patient examined, no change in status, stable for surgery.  I have reviewed the patient's chart and labs.  Questions were answered to the patient's satisfaction.    We discussed the etiology of patient's pain, we discussed treatment options and recommended surgery. We discussed details of surgery including general anesthesia, laparoscopic approach, identification of cystic duct and common bile duct. Ligation of cystic duct and cystic artery. Possible need for intraoperative cholangiogram, open procedure, and subtotal cholecystectomy. Possible risks of common bile duct injury, injury to surrounding structures, bile leak, bleeding, infection, diarrhea, retained stone and hernia. The patient showed good understanding and all questions were answered    Edmon Gosling

## 2024-02-05 NOTE — Anesthesia Postprocedure Evaluation (Signed)
 Anesthesia Post Note  Patient: Debbie [name]  Procedure(s) Performed: LAPAROSCOPIC CHOLECYSTECTOMY (Left)     Patient location during evaluation: PACU Anesthesia Type: General Level of consciousness: awake and alert Pain management: pain level controlled Vital Signs Assessment: post-procedure vital signs reviewed and stable Respiratory status: spontaneous breathing, nonlabored ventilation, respiratory function stable and patient connected to nasal cannula oxygen Cardiovascular status: blood pressure returned to baseline and stable Postop Assessment: no apparent nausea or vomiting Anesthetic complications: no   No notable events documented.  Last Vitals:  Vitals:   02/05/24 1700 02/05/24 1715  BP: 132/79 125/85  Pulse: 71 62  Resp: 13 13  Temp:    SpO2: 100% 100%    Last Pain:  Vitals:   02/05/24 1715  TempSrc:   PainSc: Asleep                 Leslye Rast

## 2024-02-05 NOTE — H&P (View-Only) (Signed)
 Chief Complaint: New Consultation (Eval of GB)     History of Present Illness: Debbie [name] is a 43 y.o. female who is seen today as an office consultation at the request of Dr. Room for evaluation of New Consultation (Eval of GB) .   History of Present Illness Debbie [name] is a 43 year old female with lupus who presents with persistent right upper quadrant abdominal pain.  She has experienced right upper quadrant abdominal pain for a few months, initially intermittent but now constant for two weeks. The pain is sharp, radiates to her back, and worsens postprandially. A CT scan in the emergency room showed no abnormalities. Morphine  and oxycodone  were ineffective for pain relief, with oxycodone  causing a headache. She experiences significant nausea without vomiting. An ultrasound showed no gallstones, but she had pain during the procedure. Laboratory tests, including liver and gallbladder function, were normal.  Her medical history includes lupus, managed with Benlysta infusions every four weeks, and a previous hernia repair. She takes levothyroxine  50 mg for thyroid  issues. She avoids fast food due to lupus but recently consumed it, worsening her symptoms. She is concerned about a lupus flare due to ongoing pain and typically has low blood pressure during flares.      Review of Systems: A complete review of systems was obtained from the patient.  I have reviewed this information and discussed as appropriate with the patient.  See HPI as well for other ROS.  Review of Systems  Constitutional:  Negative for fever.  HENT:  Negative for congestion.   Eyes:  Negative for blurred vision.  Respiratory:  Negative for cough, shortness of breath and wheezing.   Cardiovascular:  Negative for chest pain and palpitations.  Gastrointestinal:  Positive for abdominal pain, nausea and vomiting. Negative for heartburn.  Genitourinary:  Negative for dysuria.  Musculoskeletal:  Negative  for myalgias.  Skin:  Negative for rash.  Neurological:  Negative for dizziness and headaches.  Psychiatric/Behavioral:  Negative for depression and suicidal ideas.   All other systems reviewed and are negative.     Medical History: Past Medical History: Diagnosis Date  Thyroid  disease   Hypothyroid   Patient Active Problem List Diagnosis  Systemic lupus erythematosus, unspecified SLE type, unspecified organ involvement status (CMS/HHS-HCC)   History reviewed. No pertinent surgical history.   Allergies Allergen Reactions  Aspirin Hives and Rash   Pt states she can take Ibuprofen    Sulfamethoxazole -Trimethoprim  Hives, Rash and Swelling  Fluconazole Hives  Sulfa  (Sulfonamide Antibiotics) Hives   Current Outpatient Medications on File Prior to Visit Medication Sig Dispense Refill  azelastine  (ASTELIN ) 137 mcg nasal spray as needed    buPROPion (WELLBUTRIN) 100 MG tablet Take 1 tablet by mouth once daily    diclofenac (VOLTAREN) 1 % topical gel Apply 2 g topically 4 (four) times daily 100 g 11  fluticasone  propionate (FLONASE ) 50 mcg/actuation nasal spray as needed    ibuprofen  (ADVIL ,MOTRIN ) 800 MG tablet   1  sodium chloride  0.9% SolP 250 mL with belimumab 80 mg/mL (400 mg) SolR infusion Inject into the vein    belimumab 80 mg/mL (120 mg) Inject into the vein monthly Infusion treatment    cyclobenzaprine  (FLEXERIL ) 10 MG tablet Take 10 mg by mouth 3 (three) times daily    ergocalciferol, vitamin D2, 1,250 mcg (50,000 unit) capsule Take 50,000 Units by mouth every 7 (seven) days    folic acid (FOLVITE) 1 MG tablet Take 1 tablet (1 mg total) by mouth once  daily (Patient not taking: Reported on 05/20/2022) 90 tablet 3  levothyroxine  (SYNTHROID ) 150 MCG tablet Take 1 tablet (150 mcg total) by mouth once daily    methotrexate (RHEUMATREX) 2.5 MG tablet Take 6 tablets (15 mg total) by mouth every 7 (seven) days for 90 days (Patient not taking: Reported on 07/06/2022) 24  tablet 2  naproxen  (NAPROSYN ) 500 MG tablet Take by mouth (Patient not taking: Reported on 05/20/2022)    ondansetron  (ZOFRAN ) 4 MG tablet as needed    predniSONE  (DELTASONE ) 10 MG tablet Take 4 tabs daily x 3 days, 3 tabs daily x 3 days, 2 tabs daily x 3 days, 1 tab daily x 3 days, 1/2 tab daily x 3 days 35 tablet 1  predniSONE  (DELTASONE ) 5 MG tablet Take 4 tabs daily x 5 days, 3 tabs daily x 5 days, 2 tabs daily x 5 days, 1 tab daily x 5 days 50 tablet 0  SYNTHROID  175 mcg tablet   0  No current facility-administered medications on file prior to visit.   Family History Problem Relation Age of Onset  Cancer Maternal Grandmother   Cancer Maternal Grandfather     Social History  Tobacco Use Smoking Status Never Smokeless Tobacco Never    Social History  Socioeconomic History  Marital status: Single Tobacco Use  Smoking status: Never  Smokeless tobacco: Never Vaping Use  Vaping status: Never Used Substance and Sexual Activity  Alcohol use: Yes  Drug use: Never  Social Drivers of Catering manager Strain: Medium Risk (11/24/2023)  Received from [facility]  Overall Financial Resource Strain (CARDIA)   Difficulty of Paying Living Expenses: Somewhat hard Food Insecurity: No Food Insecurity (11/24/2023)  Received from [facility]  Hunger Vital Sign   Worried About Running Out of Food in the Last Year: Never true   Ran Out of Food in the Last Year: Never true Transportation Needs: No Transportation Needs (11/24/2023)  Received from [facility] - Transportation   Lack of Transportation (Medical): No   Lack of Transportation (Non-Medical): No Physical Activity: Insufficiently Active (08/07/2023)  Received from [facility]  Exercise Vital Sign   Days of Exercise per Week: 2 days   Minutes of Exercise per Session: 10 min Stress: Stress Concern Present (08/07/2023)  Received from [facility] - Occupational  Stress Questionnaire   Feeling of Stress : To some extent Social Connections: Moderately Integrated (08/07/2023)  Received from [facility]  Social Network   How would you rate your social network (family, work, friends)?: Adequate participation with social networks Housing Stability: Low Risk  (11/24/2023)  Received from [facility] Stability Vital Sign   Unable to Pay for Housing in the Last Year: No   Number of Times Moved in the Last Year: 1   Homeless in the Last Year: No   Objective:   Vitals:  02/05/24 1011 02/05/24 1012 BP: 99/66  Pulse: 87  Temp: 36.8 C (98.2 F)  SpO2: 99%  Weight: 84.5 kg (186 lb 3.2 oz)  Height: 157.5 cm (5\' 2" )  PainSc:    6 PainLoc:  Abdomen   Body mass index is 34.06 kg/m.  Physical Exam Constitutional:      Appearance: Normal appearance.  HENT:     Head: Normocephalic and atraumatic.     Mouth/Throat:     Mouth: Mucous membranes are moist.     Pharynx: Oropharynx is clear.  Eyes:     General:  No scleral icterus.    Pupils: Pupils are equal, round, and reactive to light.  Cardiovascular:     Rate and Rhythm: Normal rate and regular rhythm.     Pulses: Normal pulses.     Heart sounds: No murmur heard.    No friction rub. No gallop.  Pulmonary:     Effort: Pulmonary effort is normal. No respiratory distress.     Breath sounds: Normal breath sounds. No stridor.  Abdominal:     General: Abdomen is flat.     Tenderness: There is abdominal tenderness in the right upper quadrant. Positive signs include Murphy's sign.  Musculoskeletal:        General: No swelling.  Skin:    General: Skin is warm.  Neurological:     General: No focal deficit present.     Mental Status: She is alert and oriented to person, place, and time. Mental status is at baseline.  Psychiatric:        Mood and Affect: Mood normal.        Thought Content: Thought content normal.        Judgment: Judgment normal.      Assessment and  Plan: Diagnoses and all orders for this visit:  Symptomatic cholelithiasis    Debbie [name] is a 43 y.o. female   1.  We will proceed to the OR for a lap cholecystectomy with Dr. Ramiro Burly. 2. All risks and benefits were discussed with the patient to generally include: infection, bleeding, possible need for post op ERCP, damage to the bile ducts, and bile leak. Alternatives were offered and described.  All questions were answered and the patient voiced understanding of the procedure and wishes to proceed at this point with a laparoscopic cholecystectomy      No follow-ups on file.  Shela Derby, MD, [facility], [place] [facility]

## 2024-02-05 NOTE — H&P (Signed)
 Chief Complaint: New Consultation (Eval of GB)     History of Present Illness: Debbie [name] is a 43 y.o. female who is seen today as an office consultation at the request of Dr. Room for evaluation of New Consultation (Eval of GB) .   History of Present Illness Debbie [name] is a 43 year old female with lupus who presents with persistent right upper quadrant abdominal pain.  She has experienced right upper quadrant abdominal pain for a few months, initially intermittent but now constant for two weeks. The pain is sharp, radiates to her back, and worsens postprandially. A CT scan in the emergency room showed no abnormalities. Morphine  and oxycodone  were ineffective for pain relief, with oxycodone  causing a headache. She experiences significant nausea without vomiting. An ultrasound showed no gallstones, but she had pain during the procedure. Laboratory tests, including liver and gallbladder function, were normal.  Her medical history includes lupus, managed with Benlysta infusions every four weeks, and a previous hernia repair. She takes levothyroxine  50 mg for thyroid  issues. She avoids fast food due to lupus but recently consumed it, worsening her symptoms. She is concerned about a lupus flare due to ongoing pain and typically has low blood pressure during flares.      Review of Systems: A complete review of systems was obtained from the patient.  I have reviewed this information and discussed as appropriate with the patient.  See HPI as well for other ROS.  Review of Systems  Constitutional:  Negative for fever.  HENT:  Negative for congestion.   Eyes:  Negative for blurred vision.  Respiratory:  Negative for cough, shortness of breath and wheezing.   Cardiovascular:  Negative for chest pain and palpitations.  Gastrointestinal:  Positive for abdominal pain, nausea and vomiting. Negative for heartburn.  Genitourinary:  Negative for dysuria.  Musculoskeletal:  Negative  for myalgias.  Skin:  Negative for rash.  Neurological:  Negative for dizziness and headaches.  Psychiatric/Behavioral:  Negative for depression and suicidal ideas.   All other systems reviewed and are negative.     Medical History: Past Medical History: Diagnosis Date  Thyroid  disease   Hypothyroid   Patient Active Problem List Diagnosis  Systemic lupus erythematosus, unspecified SLE type, unspecified organ involvement status (CMS/HHS-HCC)   History reviewed. No pertinent surgical history.   Allergies Allergen Reactions  Aspirin Hives and Rash   Pt states she can take Ibuprofen    Sulfamethoxazole -Trimethoprim  Hives, Rash and Swelling  Fluconazole Hives  Sulfa  (Sulfonamide Antibiotics) Hives   Current Outpatient Medications on File Prior to Visit Medication Sig Dispense Refill  azelastine  (ASTELIN ) 137 mcg nasal spray as needed    buPROPion (WELLBUTRIN) 100 MG tablet Take 1 tablet by mouth once daily    diclofenac (VOLTAREN) 1 % topical gel Apply 2 g topically 4 (four) times daily 100 g 11  fluticasone  propionate (FLONASE ) 50 mcg/actuation nasal spray as needed    ibuprofen  (ADVIL ,MOTRIN ) 800 MG tablet   1  sodium chloride  0.9% SolP 250 mL with belimumab 80 mg/mL (400 mg) SolR infusion Inject into the vein    belimumab 80 mg/mL (120 mg) Inject into the vein monthly Infusion treatment    cyclobenzaprine  (FLEXERIL ) 10 MG tablet Take 10 mg by mouth 3 (three) times daily    ergocalciferol, vitamin D2, 1,250 mcg (50,000 unit) capsule Take 50,000 Units by mouth every 7 (seven) days    folic acid (FOLVITE) 1 MG tablet Take 1 tablet (1 mg total) by mouth once  daily (Patient not taking: Reported on 05/20/2022) 90 tablet 3  levothyroxine  (SYNTHROID ) 150 MCG tablet Take 1 tablet (150 mcg total) by mouth once daily    methotrexate (RHEUMATREX) 2.5 MG tablet Take 6 tablets (15 mg total) by mouth every 7 (seven) days for 90 days (Patient not taking: Reported on 07/06/2022) 24  tablet 2  naproxen  (NAPROSYN ) 500 MG tablet Take by mouth (Patient not taking: Reported on 05/20/2022)    ondansetron  (ZOFRAN ) 4 MG tablet as needed    predniSONE  (DELTASONE ) 10 MG tablet Take 4 tabs daily x 3 days, 3 tabs daily x 3 days, 2 tabs daily x 3 days, 1 tab daily x 3 days, 1/2 tab daily x 3 days 35 tablet 1  predniSONE  (DELTASONE ) 5 MG tablet Take 4 tabs daily x 5 days, 3 tabs daily x 5 days, 2 tabs daily x 5 days, 1 tab daily x 5 days 50 tablet 0  SYNTHROID  175 mcg tablet   0  No current facility-administered medications on file prior to visit.   Family History Problem Relation Age of Onset  Cancer Maternal Grandmother   Cancer Maternal Grandfather     Social History  Tobacco Use Smoking Status Never Smokeless Tobacco Never    Social History  Socioeconomic History  Marital status: Single Tobacco Use  Smoking status: Never  Smokeless tobacco: Never Vaping Use  Vaping status: Never Used Substance and Sexual Activity  Alcohol use: Yes  Drug use: Never  Social Drivers of Catering manager Strain: Medium Risk (11/24/2023)  Received from [facility]  Overall Financial Resource Strain (CARDIA)   Difficulty of Paying Living Expenses: Somewhat hard Food Insecurity: No Food Insecurity (11/24/2023)  Received from [facility]  Hunger Vital Sign   Worried About Running Out of Food in the Last Year: Never true   Ran Out of Food in the Last Year: Never true Transportation Needs: No Transportation Needs (11/24/2023)  Received from [facility] - Transportation   Lack of Transportation (Medical): No   Lack of Transportation (Non-Medical): No Physical Activity: Insufficiently Active (08/07/2023)  Received from [facility]  Exercise Vital Sign   Days of Exercise per Week: 2 days   Minutes of Exercise per Session: 10 min Stress: Stress Concern Present (08/07/2023)  Received from [facility] - Occupational  Stress Questionnaire   Feeling of Stress : To some extent Social Connections: Moderately Integrated (08/07/2023)  Received from [facility]  Social Network   How would you rate your social network (family, work, friends)?: Adequate participation with social networks Housing Stability: Low Risk  (11/24/2023)  Received from [facility] Stability Vital Sign   Unable to Pay for Housing in the Last Year: No   Number of Times Moved in the Last Year: 1   Homeless in the Last Year: No   Objective:   Vitals:  02/05/24 1011 02/05/24 1012 BP: 99/66  Pulse: 87  Temp: 36.8 C (98.2 F)  SpO2: 99%  Weight: 84.5 kg (186 lb 3.2 oz)  Height: 157.5 cm (5\' 2" )  PainSc:    6 PainLoc:  Abdomen   Body mass index is 34.06 kg/m.  Physical Exam Constitutional:      Appearance: Normal appearance.  HENT:     Head: Normocephalic and atraumatic.     Mouth/Throat:     Mouth: Mucous membranes are moist.     Pharynx: Oropharynx is clear.  Eyes:     General:  No scleral icterus.    Pupils: Pupils are equal, round, and reactive to light.  Cardiovascular:     Rate and Rhythm: Normal rate and regular rhythm.     Pulses: Normal pulses.     Heart sounds: No murmur heard.    No friction rub. No gallop.  Pulmonary:     Effort: Pulmonary effort is normal. No respiratory distress.     Breath sounds: Normal breath sounds. No stridor.  Abdominal:     General: Abdomen is flat.     Tenderness: There is abdominal tenderness in the right upper quadrant. Positive signs include Murphy's sign.  Musculoskeletal:        General: No swelling.  Skin:    General: Skin is warm.  Neurological:     General: No focal deficit present.     Mental Status: She is alert and oriented to person, place, and time. Mental status is at baseline.  Psychiatric:        Mood and Affect: Mood normal.        Thought Content: Thought content normal.        Judgment: Judgment normal.      Assessment and  Plan: Diagnoses and all orders for this visit:  Symptomatic cholelithiasis    Debbie [name] is a 43 y.o. female   1.  We will proceed to the OR for a lap cholecystectomy with Dr. Ramiro Burly. 2. All risks and benefits were discussed with the patient to generally include: infection, bleeding, possible need for post op ERCP, damage to the bile ducts, and bile leak. Alternatives were offered and described.  All questions were answered and the patient voiced understanding of the procedure and wishes to proceed at this point with a laparoscopic cholecystectomy      No follow-ups on file.  Shela Derby, MD, [facility], [place] [facility]

## 2024-02-05 NOTE — Plan of Care (Signed)

## 2024-02-05 NOTE — Transfer of Care (Signed)
 Immediate Anesthesia Transfer of Care Note  Patient: Debbie [name]  Procedure(s) Performed: LAPAROSCOPIC CHOLECYSTECTOMY (Left)  Patient Location: PACU  Anesthesia Type:General  Level of Consciousness: awake, alert , oriented, and patient cooperative  Airway & Oxygen Therapy: Patient Spontanous Breathing and Patient connected to face mask oxygen  Post-op Assessment: Report given to RN and Post -op Vital signs reviewed and stable  Post vital signs: Reviewed and stable  Last Vitals:  Vitals Value Taken Time  BP 141/76 02/05/24 1621  Temp    Pulse 99 02/05/24 1621  Resp 20 02/05/24 1621  SpO2 100 % 02/05/24 1621  Vitals shown include unfiled device data.  Last Pain:  Vitals:   02/05/24 1430  TempSrc:   PainSc: 5       Patients Stated Pain Goal: 5 (02/05/24 1430)  Complications: No notable events documented.

## 2024-02-05 NOTE — Anesthesia Preprocedure Evaluation (Signed)
 Anesthesia Evaluation  Patient identified by MRN, date of birth, ID band Patient awake    Reviewed: Allergy & Precautions, NPO status , Patient's Chart, lab work & pertinent test results, reviewed documented beta blocker date and time   History of Anesthesia Complications Negative for: history of anesthetic complications  Airway Mallampati: II  TM Distance: >3 FB     Dental no notable dental hx.    Pulmonary neg COPD, neg PE   breath sounds clear to auscultation       Cardiovascular (-) hypertension(-) angina (-) CAD and (-) Past MI  Rhythm:Regular Rate:Normal     Neuro/Psych  Headaches, neg Seizures PSYCHIATRIC DISORDERS  Depression       GI/Hepatic ,neg GERD  ,,(+) neg Cirrhosis        Endo/Other  Hypothyroidism    Renal/GU Renal disease     Musculoskeletal   Abdominal   Peds  Hematology  (+) Blood dyscrasia, anemia   Anesthesia Other Findings   Reproductive/Obstetrics                             Anesthesia Physical Anesthesia Plan  ASA: 2  Anesthesia Plan: General   Post-op Pain Management:    Induction: Intravenous and Rapid sequence  PONV Risk Score and Plan: 3 and Ondansetron , Dexamethasone  and Propofol  infusion  Airway Management Planned: Oral ETT  Additional Equipment:   Intra-op Plan:   Post-operative Plan: Extubation in OR  Informed Consent: I have reviewed the patients History and Physical, chart, labs and discussed the procedure including the risks, benefits and alternatives for the proposed anesthesia with the patient or authorized representative who has indicated his/her understanding and acceptance.     Dental advisory given  Plan Discussed with: CRNA  Anesthesia Plan Comments:        Anesthesia Quick Evaluation

## 2024-02-06 ENCOUNTER — Encounter (HOSPITAL_COMMUNITY): Payer: Self-pay | Admitting: General Surgery

## 2024-02-06 DIAGNOSIS — K811 Chronic cholecystitis: Secondary | ICD-10-CM | POA: Diagnosis not present

## 2024-02-06 DIAGNOSIS — E039 Hypothyroidism, unspecified: Secondary | ICD-10-CM | POA: Diagnosis not present

## 2024-02-06 DIAGNOSIS — Z79899 Other long term (current) drug therapy: Secondary | ICD-10-CM | POA: Diagnosis not present

## 2024-02-06 MED ORDER — KETOROLAC TROMETHAMINE 30 MG/ML IJ SOLN
30.0000 mg | Freq: Four times a day (QID) | INTRAMUSCULAR | Status: DC
  Administered 2024-02-06 – 2024-02-07 (×5): 30 mg via INTRAVENOUS
  Filled 2024-02-06 (×5): qty 1

## 2024-02-06 MED ORDER — METHOCARBAMOL 500 MG PO TABS
1000.0000 mg | ORAL_TABLET | Freq: Four times a day (QID) | ORAL | Status: DC
  Administered 2024-02-06 – 2024-02-07 (×4): 1000 mg via ORAL
  Filled 2024-02-06 (×4): qty 2

## 2024-02-06 MED ORDER — TRAMADOL HCL 50 MG PO TABS
50.0000 mg | ORAL_TABLET | Freq: Four times a day (QID) | ORAL | Status: DC | PRN
  Administered 2024-02-06: 100 mg via ORAL
  Filled 2024-02-06: qty 2

## 2024-02-06 MED ORDER — ACETAMINOPHEN 500 MG PO TABS
1000.0000 mg | ORAL_TABLET | Freq: Four times a day (QID) | ORAL | Status: DC
  Administered 2024-02-06 – 2024-02-07 (×4): 1000 mg via ORAL
  Filled 2024-02-06 (×5): qty 2

## 2024-02-06 NOTE — Plan of Care (Signed)

## 2024-02-06 NOTE — Progress Notes (Signed)
 1 Day Post-Op  Subjective: CC: Pt reports pain around her incisions that she does not feel is well controlled w/ PO medications. Required a dose of IV pain medication this am. She also thinks medications may be making her nauseated but she is not sure which ones exactly. She has been able to have ginerale, crackers and soup since surgery without worsening of her nausea or vomiting. Passing flatus. No bm. Voiding. Mobilizing.   She denies baseline nausea before she began having issues with her gallbladder. She denies being on pain medication at baseline/prior to admission. She is not sure if she has had any pain medication that has worked well for her in the past.   Afebrile. Tachycardia resolved. No hypotension.   Objective: Vital signs in last 24 hours: Temp:  [97.4 F (36.3 C)-98.2 F (36.8 C)] 98 F (36.7 C) (05/09 0621) Pulse Rate:  [62-104] 64 (05/09 0621) Resp:  [11-21] 18 (05/09 0621) BP: (102-141)/(67-91) 102/67 (05/09 0621) SpO2:  [97 %-100 %] 100 % (05/09 0621) Weight:  [83.9 kg] 83.9 kg (05/08 1309) Last BM Date : 02/04/24  Intake/Output from previous day: 05/08 0701 - 05/09 0700 In: 2040 [P.O.:240; I.V.:1700; IV Piggyback:100] Out: 410 [Urine:400; Blood:10] Intake/Output this shift: Total I/O In: 240 [P.O.:240] Out: -   PE: Gen:  Alert, NAD, pleasant Card:  RRR Pulm:  CTAB, no W/R/R, effort normal Abd: Soft, no distension, appropriately tender around laparoscopic incisions, no rigidity or guarding and otherwise NT, +BS. Incisions with glue intact appears well and are without drainage, bleeding, or signs of infection.  Psych: A&Ox3   Lab Results:  Recent Labs    02/04/24 2012  WBC 6.0  HGB 11.2*  HCT 34.0*  PLT 262   BMET Recent Labs    02/04/24 2012  NA 139  K 3.9  CL 105  CO2 22  GLUCOSE 110*  BUN 14  CREATININE 0.82  CALCIUM 9.0   PT/INR No results for input(s): "LABPROT", "INR" in the last 72 hours. CMP     Component Value Date/Time    NA 139 02/04/2024 2012   NA 140 06/12/2015 1601   K 3.9 02/04/2024 2012   CL 105 02/04/2024 2012   CO2 22 02/04/2024 2012   GLUCOSE 110 (H) 02/04/2024 2012   BUN 14 02/04/2024 2012   BUN 14 06/12/2015 1601   CREATININE 0.82 02/04/2024 2012   CALCIUM 9.0 02/04/2024 2012   PROT 7.1 02/04/2024 2012   PROT 6.4 06/12/2015 1601   ALBUMIN 4.3 02/04/2024 2012   ALBUMIN 4.3 06/12/2015 1601   AST 21 02/04/2024 2012   ALT 13 02/04/2024 2012   ALKPHOS 52 02/04/2024 2012   BILITOT 0.3 02/04/2024 2012   BILITOT 0.6 06/12/2015 1601   GFRNONAA >60 02/04/2024 2012   GFRAA >60 12/02/2019 1249   Lipase     Component Value Date/Time   LIPASE 53 (H) 02/04/2024 2012    Studies/Results: US  Abdomen Limited RUQ (LIVER/GB) Result Date: 02/04/2024 CLINICAL DATA:  Right upper quadrant pain radiating to the back, worse postprandial EXAM: ULTRASOUND ABDOMEN LIMITED RIGHT UPPER QUADRANT COMPARISON:  CT chest abdomen pelvis 11/22/2023 FINDINGS: Gallbladder: Decompressed gallbladder. The wall is not thickened measuring 2 mm. No pericholecystic fluid. There was a positive sonographic Murphy sign noted by the sonographer. Common bile duct: Diameter: 4 mm.  No intrahepatic biliary dilation. Liver: No focal lesion identified. Increased parenchymal echogenicity. Portal vein is patent on color Doppler imaging with normal direction of blood flow towards the liver.  Other: None. IMPRESSION: 1. Decompressed gallbladder with positive sonographic Murphy sign. No gallbladder wall thickening or pericholecystic fluid. Findings are equivocal for acute cholecystitis. If there is persistent clinical concern, consider further evaluation with HIDA scan. 2. Hepatic steatosis. Electronically Signed   By: Rozell Cornet M.D.   On: 02/04/2024 21:07    Anti-infectives: Anti-infectives (From admission, onward)    Start     Dose/Rate Route Frequency Ordered Stop   02/05/24 1430  ceFAZolin  (ANCEF ) IVPB 2g/100 mL premix        2 g 200  mL/hr over 30 Minutes Intravenous  Once 02/05/24 1425 02/05/24 1519   02/05/24 1423  ceFAZolin  (ANCEF ) 2-4 GM/100ML-% IVPB       Note to Pharmacy: Vickki Grandchild R: cabinet override      02/05/24 1423 02/05/24 1522        Assessment/Plan POD 1 laparoscopic cholecystectomy by Dr. Ramiro Burly on 02/05/24 for chronic cholecystitis  - Adv diet - Adjust pain medication. Wean IV pain medications.  - Mobilize - Pulm toilet  - Possible d/c later today.   FEN - Reg VTE - SCDs, Lovenox  ID - Peri-op abx. None currently.   Lupus Hypothyroidism - home meds   LOS: 0 days    Delton Filbert, [facility] Surgery 02/06/2024, 10:29 AM Please see Amion for pager number during day hours 7:00am-4:30pm

## 2024-02-06 NOTE — TOC Initial Note (Signed)
 Transition of Care [facility]) - Initial/Assessment Note    Patient Details  Name: Debbie [name] MRN: [id number] Date of Birth: 01/05/1981  Transition of [facility]) CM/SW Contact:    Levie Ream, RN Phone Number: 02/06/2024, 11:17 AM  Clinical Narrative:                 Spoke w/ pt in room; pt says she lives at home w/ her children; she plans to return at d/c; pt identified POC Mathilda Solum (niece) [phone]; family will provide transportation; pt verified insurance/PCP; she denied SDOH risks; pt says she does not have DME, HH services, or home oxygen; no TOC needs; TOC is signing off; please place consult if needed.  Expected Discharge Plan: Home/Self Care Barriers to Discharge: Continued Medical Work up   Patient Goals and CMS Choice Patient states their goals for this hospitalization and ongoing recovery are:: home          Expected Discharge Plan and Services   Discharge Planning Services: CM Consult   Living arrangements for the past 2 months: Single Family Home                                      Prior Living Arrangements/Services Living arrangements for the past 2 months: Single Family Home Lives with:: Minor Children Patient language and need for interpreter reviewed:: Yes Do you feel safe going back to the place where you live?: Yes      Need for Family Participation in Patient Care: Yes (Comment)   Current home services:  (n/a) Criminal Activity/Legal Involvement Pertinent to Current Situation/Hospitalization: No - Comment as needed  Activities of Daily Living   ADL Screening (condition at time of admission) Independently performs ADLs?: Yes (appropriate for developmental age) Is the patient deaf or have difficulty hearing?: No Does the patient have difficulty seeing, even when wearing glasses/contacts?: Yes Does the patient have difficulty concentrating, remembering, or making decisions?: No  Permission Sought/Granted Permission sought to  share information with : Case Manager Permission granted to share information with : Yes, Verbal Permission Granted  Share Information with NAME: Case Manager     Permission granted to share info w Relationship: Mathilda Solum (niece) [phone]     Emotional Assessment Appearance:: Appears stated age Attitude/Demeanor/Rapport: Gracious Affect (typically observed): Accepting Orientation: : Oriented to Self, Oriented to Place, Oriented to  Time, Oriented to Situation Alcohol / Substance Use: Not Applicable Psych Involvement: No (comment)  Admission diagnosis:  Cholecystitis [K81.9] Patient Active Problem List   Diagnosis Date Noted   Acute cholecystitis 02/05/2024   Multiple drug allergies 02/05/2024   GERD (gastroesophageal reflux disease) 02/05/2024   History of RhD negative blood typing 02/05/2024   Hyperemesis 02/05/2024   Dyslipidemia 07/09/2022   Environmental allergies 11/20/2021   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   Post-nasal drainage 11/28/2020   Rash and other nonspecific skin eruption 08/01/2020   History of asthma 08/01/2020   Adverse food reaction 08/01/2020   Atopic dermatitis 07/13/2020   Anxiety 06/15/2020   Abdominal pain 07/09/2019   Postoperative pain 07/07/2019   Postoperative state 06/23/2019   SLE (systemic lupus erythematosus) (HCC) 05/19/2019   Obesity (BMI 30.0-34.9) 05/19/2019   Depression, recurrent (HCC) 05/19/2019   Hypothyroidism 05/19/2019   Positive ANA (antinuclear antibody) 02/01/2016   Elevated transaminase level 10/05/2015   Active labor 03/22/2014   Varicose veins of  leg with pain 02/16/2013   Laryngitis, acute 04/28/2012   Vomiting 04/28/2012   Fever 04/28/2012   Anemia 01/27/2012   Migraine without status migrainosus, not intractable 02/18/2011   Other allergic rhinitis 02/18/2011   Acquired hypothyroidism 02/18/2011   PCP:  Dyana Glade, MD Pharmacy:   [facility]  [id number] - [place], [place] - [address] AT [address] [place] [address] Phone: [phone] Fax: [phone]  [facility] - [place], [place] - [address] [place] [address] Phone: [phone] Fax: [phone]  [id number] - [place], [place] - [address] AT [facility] [address]  [place] [address] Phone: [phone] Fax: [phone]  [id number] - [place], [place] - [address] AT [facility] OF [address] [place] [address] Phone: [phone] Fax: [phone]     Social Drivers of Health (SDOH) Social History: SDOH Screenings   Food Insecurity: No Food Insecurity (02/06/2024)  Housing: Low Risk  (02/06/2024)  Transportation Needs: No Transportation Needs (02/06/2024)  Utilities: Not At Risk (02/06/2024)  Depression (PHQ2-9): Low Risk  (05/19/2019)  Financial Resource Strain: Medium Risk (11/24/2023)   Received from [facility]  Physical Activity: Insufficiently Active (08/07/2023)   Received from [facility]  Social Connections: Moderately Integrated (08/07/2023)   Received from [facility]  Stress: Stress Concern Present (08/07/2023)   Received from [facility]  Tobacco Use: Low Risk  (02/05/2024)   Received from [facility]   SDOH Interventions: Food Insecurity Interventions: Intervention Not Indicated, Inpatient TOC Housing Interventions: Intervention Not Indicated, [id number] Referral Transportation Interventions: Intervention Not Indicated, Inpatient TOC Utilities Interventions: Intervention Not Indicated, Inpatient TOC   Readmission Risk Interventions     No data to display

## 2024-02-07 DIAGNOSIS — K811 Chronic cholecystitis: Secondary | ICD-10-CM | POA: Diagnosis not present

## 2024-02-07 DIAGNOSIS — E039 Hypothyroidism, unspecified: Secondary | ICD-10-CM | POA: Diagnosis not present

## 2024-02-07 DIAGNOSIS — Z79899 Other long term (current) drug therapy: Secondary | ICD-10-CM | POA: Diagnosis not present

## 2024-02-07 MED ORDER — TRAMADOL HCL 50 MG PO TABS
50.0000 mg | ORAL_TABLET | Freq: Four times a day (QID) | ORAL | 0 refills | Status: DC | PRN
Start: 2024-02-07 — End: 2024-02-11

## 2024-02-07 NOTE — Progress Notes (Signed)
 2 Days Post-Op   Subjective/Chief Complaint: Feels better   Objective: Vital signs in last 24 hours: Temp:  [97.8 F (36.6 C)-98.4 F (36.9 C)] 97.8 F (36.6 C) (05/10 0550) Pulse Rate:  [73-78] 77 (05/10 0550) Resp:  [16-18] 16 (05/10 0550) BP: (100-133)/(58-82) 100/61 (05/10 0554) SpO2:  [96 %-97 %] 97 % (05/10 0550) Last BM Date : 02/04/24  Intake/Output from previous day: 05/09 0701 - 05/10 0700 In: 960 [P.O.:960] Out: -  Intake/Output this shift: Total I/O In: 240 [P.O.:240] Out: -   General appearance: alert and cooperative Resp: clear to auscultation bilaterally Cardio: regular rate and rhythm GI: soft, mild tenderness. Incisions look good  Lab Results:  Recent Labs    02/04/24 2012  WBC 6.0  HGB 11.2*  HCT 34.0*  PLT 262   BMET Recent Labs    02/04/24 2012  NA 139  K 3.9  CL 105  CO2 22  GLUCOSE 110*  BUN 14  CREATININE 0.82  CALCIUM 9.0   PT/INR No results for input(s): "LABPROT", "INR" in the last 72 hours. ABG No results for input(s): "PHART", "HCO3" in the last 72 hours.  Invalid input(s): "PCO2", "PO2"  Studies/Results: No results found.  Anti-infectives: Anti-infectives (From admission, onward)    Start     Dose/Rate Route Frequency Ordered Stop   02/05/24 1430  ceFAZolin  (ANCEF ) IVPB 2g/100 mL premix        2 g 200 mL/hr over 30 Minutes Intravenous  Once 02/05/24 1425 02/05/24 1519   02/05/24 1423  ceFAZolin  (ANCEF ) 2-4 GM/100ML-% IVPB       Note to Pharmacy: Vickki Grandchild R: cabinet override      02/05/24 1423 02/05/24 1522       Assessment/Plan: s/p Procedure(s) with comments: LAPAROSCOPIC CHOLECYSTECTOMY (Left) - WITH ICG Advance diet Discharge  LOS: 0 days    Lillette Reid III 02/07/2024

## 2024-02-09 LAB — SURGICAL PATHOLOGY

## 2024-02-11 ENCOUNTER — Emergency Department (HOSPITAL_BASED_OUTPATIENT_CLINIC_OR_DEPARTMENT_OTHER)
Admission: EM | Admit: 2024-02-11 | Discharge: 2024-02-11 | Disposition: A | Discharge status: Home / Self Care | Attending: Emergency Medicine | Admitting: Emergency Medicine

## 2024-02-11 ENCOUNTER — Emergency Department (HOSPITAL_BASED_OUTPATIENT_CLINIC_OR_DEPARTMENT_OTHER)

## 2024-02-11 ENCOUNTER — Encounter (HOSPITAL_BASED_OUTPATIENT_CLINIC_OR_DEPARTMENT_OTHER): Payer: Self-pay | Admitting: Emergency Medicine

## 2024-02-11 ENCOUNTER — Other Ambulatory Visit: Payer: Self-pay

## 2024-02-11 DIAGNOSIS — Z79899 Other long term (current) drug therapy: Secondary | ICD-10-CM | POA: Diagnosis not present

## 2024-02-11 DIAGNOSIS — G8918 Other acute postprocedural pain: Secondary | ICD-10-CM | POA: Insufficient documentation

## 2024-02-11 DIAGNOSIS — M79604 Pain in right leg: Secondary | ICD-10-CM | POA: Insufficient documentation

## 2024-02-11 DIAGNOSIS — E039 Hypothyroidism, unspecified: Secondary | ICD-10-CM | POA: Insufficient documentation

## 2024-02-11 DIAGNOSIS — J45909 Unspecified asthma, uncomplicated: Secondary | ICD-10-CM | POA: Diagnosis not present

## 2024-02-11 DIAGNOSIS — R1011 Right upper quadrant pain: Secondary | ICD-10-CM | POA: Insufficient documentation

## 2024-02-11 LAB — URINALYSIS, ROUTINE W REFLEX MICROSCOPIC
Bilirubin Urine: NEGATIVE
Glucose, UA: NEGATIVE mg/dL
Hgb urine dipstick: NEGATIVE
Ketones, ur: NEGATIVE mg/dL
Leukocytes,Ua: NEGATIVE
Nitrite: NEGATIVE
Protein, ur: NEGATIVE mg/dL
Specific Gravity, Urine: 1.025 (ref 1.005–1.030)
pH: 7 (ref 5.0–8.0)

## 2024-02-11 LAB — CBC
HCT: 37.4 % (ref 36.0–46.0)
Hemoglobin: 12.1 g/dL (ref 12.0–15.0)
MCH: 23.2 pg — ABNORMAL LOW (ref 26.0–34.0)
MCHC: 32.4 g/dL (ref 30.0–36.0)
MCV: 71.6 fL — ABNORMAL LOW (ref 80.0–100.0)
Platelets: 436 10*3/uL — ABNORMAL HIGH (ref 150–400)
RBC: 5.22 MIL/uL — ABNORMAL HIGH (ref 3.87–5.11)
RDW: 16.1 % — ABNORMAL HIGH (ref 11.5–15.5)
WBC: 5 10*3/uL (ref 4.0–10.5)
nRBC: 0.4 % — ABNORMAL HIGH (ref 0.0–0.2)

## 2024-02-11 LAB — COMPREHENSIVE METABOLIC PANEL WITH GFR
ALT: 90 U/L — ABNORMAL HIGH (ref 0–44)
AST: 65 U/L — ABNORMAL HIGH (ref 15–41)
Albumin: 4.6 g/dL (ref 3.5–5.0)
Alkaline Phosphatase: 73 U/L (ref 38–126)
Anion gap: 11 (ref 5–15)
BUN: 12 mg/dL (ref 6–20)
CO2: 24 mmol/L (ref 22–32)
Calcium: 9.3 mg/dL (ref 8.9–10.3)
Chloride: 103 mmol/L (ref 98–111)
Creatinine, Ser: 0.79 mg/dL (ref 0.44–1.00)
GFR, Estimated: >60 mL/min (ref >60)
Glucose, Bld: 108 mg/dL — ABNORMAL HIGH (ref 70–99)
Potassium: 4 mmol/L (ref 3.5–5.1)
Sodium: 138 mmol/L (ref 135–145)
Total Bilirubin: 0.3 mg/dL (ref 0.0–1.2)
Total Protein: 7.4 g/dL (ref 6.5–8.1)

## 2024-02-11 LAB — LIPASE, BLOOD: Lipase: 56 U/L — ABNORMAL HIGH (ref 11–51)

## 2024-02-11 MED ORDER — IOHEXOL 300 MG/ML  SOLN
100.0000 mL | Freq: Once | INTRAMUSCULAR | Status: AC | PRN
  Administered 2024-02-11: 100 mL via INTRAVENOUS

## 2024-02-11 MED ORDER — HYDROCODONE-ACETAMINOPHEN 5-325 MG PO TABS: 1.0000 | ORAL_TABLET | Freq: Four times a day (QID) | ORAL | 0 refills | Status: AC | PRN

## 2024-02-11 MED ORDER — ONDANSETRON HCL 4 MG PO TABS: 4.0000 mg | ORAL_TABLET | Freq: Four times a day (QID) | ORAL | 0 refills | Status: AC

## 2024-02-11 MED ORDER — LACTATED RINGERS IV BOLUS
1000.0000 mL | Freq: Once | INTRAVENOUS | Status: AC
  Administered 2024-02-11: 1000 mL via INTRAVENOUS

## 2024-02-11 MED ORDER — ONDANSETRON HCL 4 MG/2ML IJ SOLN
4.0000 mg | Freq: Once | INTRAMUSCULAR | Status: AC
  Administered 2024-02-11: 4 mg via INTRAVENOUS
  Filled 2024-02-11: qty 2

## 2024-02-11 MED ORDER — KETOROLAC TROMETHAMINE 15 MG/ML IJ SOLN
15.0000 mg | Freq: Once | INTRAMUSCULAR | Status: AC
  Administered 2024-02-11: 15 mg via INTRAVENOUS
  Filled 2024-02-11: qty 1

## 2024-02-11 NOTE — ED Provider Notes (Signed)
 [facility] Provider Note  CSN: [id number] Arrival date & time: 02/11/24 1644  Chief Complaint(s) Abdominal Pain and Leg Pain  HPI Debbie [name] is a 43 y.o. female history of lupus presenting to the emergency department with right upper quadrant pain.  Patient recently admitted to the hospital for cholecystectomy.  Had surgery performed around 1 week ago.  She reports that she has having some worsening pain since discharge.  Stop taking oxycodone  because it was causing her headaches.  Was apparently also prescribed tramadol  and reports this does not help much with her symptoms.  Also having nausea and was not given anything for nausea.  No vomiting.  No diarrhea.  Having regular bowel movements.  No urinary symptoms.  She also reports some right leg pain, no obvious swelling or or other abnormality to the leg.   Past Medical History Past Medical History:  Diagnosis Date   Anemia    Chest pain    Hypothyroidism    Lupus    Migraine    Migraines    Subconjunctival hemorrhage sm prob from vomiting 04/28/2012   Systemic lupus (HCC)    Thyroid  disease    UTI (urinary tract infection)    Vaginal delivery 2002, 2009, 2015   Varicose veins    Left > than right   Patient Active Problem List   Diagnosis Date Noted   Acute cholecystitis 02/05/2024   Multiple drug allergies 02/05/2024   GERD (gastroesophageal reflux disease) 02/05/2024   History of RhD negative blood typing 02/05/2024   Hyperemesis 02/05/2024   Dyslipidemia 07/09/2022   Environmental allergies 11/20/2021   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   Post-nasal drainage 11/28/2020   Rash and other nonspecific skin eruption 08/01/2020   History of asthma 08/01/2020   Adverse food reaction 08/01/2020   Atopic dermatitis 07/13/2020   Anxiety 06/15/2020   Abdominal pain 07/09/2019   Postoperative pain 07/07/2019   Postoperative state  06/23/2019   SLE (systemic lupus erythematosus) (HCC) 05/19/2019   Obesity (BMI 30.0-34.9) 05/19/2019   Depression, recurrent (HCC) 05/19/2019   Hypothyroidism 05/19/2019   Positive ANA (antinuclear antibody) 02/01/2016   Elevated transaminase level 10/05/2015   Active labor 03/22/2014   Varicose veins of leg with pain 02/16/2013   Laryngitis, acute 04/28/2012   Vomiting 04/28/2012   Fever 04/28/2012   Anemia 01/27/2012   Migraine without status migrainosus, not intractable 02/18/2011   Other allergic rhinitis 02/18/2011   Acquired hypothyroidism 02/18/2011   Home Medication(s) Prior to Admission medications   Medication Sig Start Date End Date Taking? Authorizing Provider  HYDROcodone -acetaminophen  (NORCO/VICODIN) 5-325 MG tablet Take 1 tablet by mouth every 6 (six) hours as needed. 02/11/24  Yes Mordecai Applebaum, MD  ondansetron  (ZOFRAN ) 4 MG tablet Take 1 tablet (4 mg total) by mouth every 6 (six) hours. 02/11/24  Yes Mordecai Applebaum, MD  acetaminophen  (TYLENOL ) 500 MG tablet Take 2 tablets (1,000 mg total) by mouth every 8 (eight) hours as needed. 02/05/24   Maczis, Michael M, PA-C  Belimumab (BENLYSTA IV) Inject into the vein. Every 4 weeks.    [provider]  cyclobenzaprine  (FLEXERIL ) 10 MG tablet Take 1 tablet (10 mg total) by mouth 3 (three) times daily as needed for muscle spasms. Patient not taking: Reported on 02/05/2024 11/22/23   Sherel Dikes, PA-C  hydrOXYzine  (VISTARIL ) 25 MG capsule Take 1 capsule (25 mg total) by mouth at bedtime as needed for itching. Take  1 hour before bedtime. Patient not taking: Reported on 02/05/2024 08/07/20   Trudy Fusi, DO  senna-docusate (SENOKOT-S) 8.6-50 MG tablet Take 1 tablet by mouth at bedtime as needed for mild constipation. Patient not taking: Reported on 02/05/2024 02/04/24   Roberts Ching, MD  SYNTHROID  150 MCG tablet TAKE 1 TABLET(150 MCG) BY MOUTH DAILY BEFORE BREAKFAST 01/12/20   Zilphia Hilt, Charyl Coppersmith, MD  Topiramate   ER (TROKENDI  XR) 25 MG CP24 Take 1 capsule (25 mg total) by mouth at bedtime. Patient not taking: Reported on 02/05/2024 02/01/20   Lomax, Amy, NP  Vitamin D, Ergocalciferol, (DRISDOL) 1.25 MG (50000 UNIT) CAPS capsule Take 50,000 Units by mouth once a week.    [provider]                                                                                                                                    Past Surgical History Past Surgical History:  Procedure Laterality Date   APPENDECTOMY     CHOLECYSTECTOMY Left 02/05/2024   Procedure: LAPAROSCOPIC CHOLECYSTECTOMY;  Surgeon: Edmon Gosling, MD;  Location: [facility];  Service: General;  Laterality: Left;  WITH ICG   CYSTOSCOPY N/A 06/23/2019   Procedure: CYSTOSCOPY;  Surgeon: Matt Song, MD;  Location: [facility];  Service: Gynecology;  Laterality: N/A;   DILATION AND EVACUATION N/A 04/27/2013   Procedure: DILATATION AND EVACUATION;  Surgeon: Oddis Bench, MD;  Location: [facility];  Service: Gynecology;  Laterality: N/A;   LAPAROSCOPIC TUBAL LIGATION Bilateral 06/08/2014   Procedure: LAPAROSCOPIC TUBAL LIGATION;  Surgeon: Oddis Bench, MD;  Location: [facility];  Service: Gynecology;  Laterality: Bilateral;   ROBOTIC ASSISTED LAPAROSCOPIC HYSTERECTOMY AND SALPINGECTOMY Bilateral 06/23/2019   Procedure: XI ROBOTIC ASSISTED LAPAROSCOPIC HYSTERECTOMY AND SALPINGECTOMY;  Surgeon: Matt Song, MD;  Location: [facility];  Service: Gynecology;  Laterality: Bilateral;  TS RNFA confirmed on 06/08/19 CS   TONSILLECTOMY  2001   TONSILLECTOMY     TONSILLECTOMY AND ADENOIDECTOMY     UMBILICAL HERNIA REPAIR  03/2017   x2   Family History Family History  Problem Relation Age of Onset   Hypertension Mother    Hyperlipidemia Father    Diabetes Father    Hypertension Father    Arthritis Neg Hx        family   Migraines Neg Hx     Social History Social History   Tobacco Use   Smoking status: Never    Smokeless tobacco: Never  Vaping Use   Vaping status: Never Used  Substance Use Topics   Alcohol use: Yes    Comment: SOCIALLY   Drug use: No   Allergies Amoxicillin , Hydroxychloroquine, Trimethoprim , Aspirin, Diflucan [fluconazole], Oxycodone  hcl, Sulfamethoxazole -trimethoprim , Bactrim , and Sulfa  antibiotics  Review of Systems Review of Systems  All other systems reviewed and are negative.   Physical Exam Vital Signs  I have reviewed the triage vital  signs BP 112/66   Pulse 78   Temp 98.2 F (36.8 C) (Oral)   Resp 15   Ht 5\' 2"  (1.575 m)   Wt 83.9 kg   LMP 05/17/2019 (Approximate)   SpO2 100%   BMI 33.84 kg/m  Physical Exam Vitals and nursing note reviewed.  Constitutional:      General: She is not in acute distress.    Appearance: She is well-developed.  HENT:     Head: Normocephalic and atraumatic.     Mouth/Throat:     Mouth: Mucous membranes are moist.  Eyes:     Pupils: Pupils are equal, round, and reactive to light.  Cardiovascular:     Rate and Rhythm: Normal rate and regular rhythm.     Heart sounds: No murmur heard. Pulmonary:     Effort: Pulmonary effort is normal. No respiratory distress.     Breath sounds: Normal breath sounds.  Abdominal:     General: Abdomen is flat.     Palpations: Abdomen is soft.     Tenderness: There is abdominal tenderness in the right upper quadrant.  Musculoskeletal:        General: No tenderness.     Right lower leg: No edema.     Left lower leg: No edema.  Skin:    General: Skin is warm and dry.  Neurological:     General: No focal deficit present.     Mental Status: She is alert. Mental status is at baseline.  Psychiatric:        Mood and Affect: Mood normal.        Behavior: Behavior normal.     ED Results and Treatments Labs (all labs ordered are listed, but only abnormal results are displayed) Labs Reviewed  LIPASE, BLOOD - Abnormal; Notable for the following components:      Result Value   Lipase  56 (*)    All other components within normal limits  COMPREHENSIVE METABOLIC PANEL WITH GFR - Abnormal; Notable for the following components:   Glucose, Bld 108 (*)    AST 65 (*)    ALT 90 (*)    All other components within normal limits  CBC - Abnormal; Notable for the following components:   RBC 5.22 (*)    MCV 71.6 (*)    MCH 23.2 (*)    RDW 16.1 (*)    Platelets 436 (*)    nRBC 0.4 (*)    All other components within normal limits  URINALYSIS, ROUTINE W REFLEX MICROSCOPIC                                                                                                                          Radiology CT ABDOMEN PELVIS W CONTRAST Result Date: 02/11/2024 CLINICAL DATA:  Cholecystectomy last week, postoperative abdominal pain EXAM: CT ABDOMEN AND PELVIS WITH CONTRAST TECHNIQUE: Multidetector CT imaging of the abdomen and pelvis was performed using the standard protocol following bolus administration of intravenous contrast.  RADIATION DOSE REDUCTION: This exam was performed according to the departmental dose-optimization program which includes automated exposure control, adjustment of the mA and/or kV according to patient size and/or use of iterative reconstruction technique. CONTRAST:  OMNIPAQUE  IOHEXOL  300 MG/ML  SOLN COMPARISON:  11/22/2023, 02/04/2024 FINDINGS: Lower chest: No acute pleural or parenchymal lung disease. Hepatobiliary: Postsurgical changes from cholecystectomy, with a 1.5 x 1.7 x 2.3 cm simple fluid collection at the gallbladder fossa likely representing postoperative seroma. There is no rim enhancement to suggest abscess. The liver is unremarkable.  No biliary duct dilation. Pancreas: Unremarkable. No pancreatic ductal dilatation or surrounding inflammatory changes. Spleen: Normal in size without focal abnormality. Adrenals/Urinary Tract: Adrenal glands are unremarkable. Kidneys are normal, without renal calculi, focal lesion, or hydronephrosis. Bladder is decompressed,  limiting its evaluation. Stomach/Bowel: No bowel obstruction or ileus. Prior appendectomy. No bowel wall thickening or inflammatory change. Vascular/Lymphatic: No significant vascular findings are present. No enlarged abdominal or pelvic lymph nodes. Reproductive: Status post hysterectomy. No adnexal masses. Other: Trace pelvic free fluid. No free intraperitoneal gas. Prior umbilical hernia repair. Musculoskeletal: No acute or destructive bony abnormalities. Reconstructed images demonstrate no additional findings. IMPRESSION: 1. Small simple appearing fluid collection at the gallbladder fossa, favor postoperative seroma given recent cholecystectomy. No rim enhancement to suggest abscess. 2. Trace pelvic free fluid, nonspecific but likely secondary to recent cholecystectomy. Electronically Signed   By: Bobbye Burrow M.D.   On: 02/11/2024 21:10   US  Venous Img Lower Right (DVT Study) Result Date: 02/11/2024 CLINICAL DATA:  Left pain, leg swelling EXAM: RIGHT LOWER EXTREMITY VENOUS DOPPLER ULTRASOUND TECHNIQUE: Gray-scale sonography with compression, as well as color and duplex ultrasound, were performed to evaluate the deep venous system(s) from the level of the common femoral vein through the popliteal and proximal calf veins. COMPARISON:  None Available. FINDINGS: VENOUS Normal compressibility of the common femoral, superficial femoral, and popliteal veins, as well as the visualized calf veins. Visualized portions of profunda femoral vein and great saphenous vein unremarkable. No filling defects to suggest DVT on grayscale or color Doppler imaging. Doppler waveforms show normal direction of venous flow, normal respiratory plasticity and response to augmentation. Limited views of the contralateral common femoral vein are unremarkable. OTHER None. Limitations: none IMPRESSION: Negative. Electronically Signed   By: Worthy Heads M.D.   On: 02/11/2024 20:26    Pertinent labs & imaging results that were available  during my care of the patient were reviewed by me and considered in my medical decision making (see MDM for details).  Medications Ordered in ED Medications  lactated ringers  bolus 1,000 mL (0 mLs Intravenous Stopped 02/11/24 2144)  ondansetron  (ZOFRAN ) injection 4 mg (4 mg Intravenous Given 02/11/24 2021)  ketorolac  (TORADOL ) 15 MG/ML injection 15 mg (15 mg Intravenous Given 02/11/24 2021)  iohexol  (OMNIPAQUE ) 300 MG/ML solution 100 mL (100 mLs Intravenous Contrast Given 02/11/24 2040)  Procedures Procedures  (including critical care time)  Medical Decision Making / ED Course   MDM:  43 year old presenting to the emergency department with abdominal pain.  Patient overall well-appearing, does have some right upper quadrant tenderness on exam.  Given recent surgery, considered postoperative process such as abscess, obstruction, perforation, bile leak, or other process such as appendicitis, nephrolithiasis, volvulus.  Obtained CT scan which is reassuring and shows small seroma but no other finding.  Suspect pain is related to recent surgery.  Given patient not tolerating oxycodone  and reporting tramadol  does not help, prescribe small mount of Norco, advised further follow-up with general surgery.  Patient reported she was sent in by general surgery office, discussed with Dr. Lanell Pinta including imaging results, on-call general surgery and she will send message to patient physician Dr. Ramiro Burly.  Labs have some nonspecific LFT abnormalities likely related to recent surgery.  Advise recheck.  She also reported some right leg pain.  On exam, there is no focal abnormality of her right leg.  Given recent surgery, DVT ultrasound obtained and is negative.  Extreme low concern for other process such as cellulitis or infection, acute arterial occlusion, fracture,     Additional  history obtained:  -External records from outside source obtained and reviewed including: Chart review including previous notes, labs, imaging, consultation notes including d/c summary   Lab Tests: -I ordered, reviewed, and interpreted labs.   The pertinent results include:   Labs Reviewed  LIPASE, BLOOD - Abnormal; Notable for the following components:      Result Value   Lipase 56 (*)    All other components within normal limits  COMPREHENSIVE METABOLIC PANEL WITH GFR - Abnormal; Notable for the following components:   Glucose, Bld 108 (*)    AST 65 (*)    ALT 90 (*)    All other components within normal limits  CBC - Abnormal; Notable for the following components:   RBC 5.22 (*)    MCV 71.6 (*)    MCH 23.2 (*)    RDW 16.1 (*)    Platelets 436 (*)    nRBC 0.4 (*)    All other components within normal limits  URINALYSIS, ROUTINE W REFLEX MICROSCOPIC    Imaging Studies ordered: I ordered imaging studies including CT abdomen On my interpretation imaging demonstrates no acute process I independently visualized and interpreted imaging. I agree with the radiologist interpretation   Medicines ordered and prescription drug management: Meds ordered this encounter  Medications   lactated ringers  bolus 1,000 mL   ondansetron  (ZOFRAN ) injection 4 mg   ketorolac  (TORADOL ) 15 MG/ML injection 15 mg   iohexol  (OMNIPAQUE ) 300 MG/ML solution 100 mL   HYDROcodone -acetaminophen  (NORCO/VICODIN) 5-325 MG tablet    Sig: Take 1 tablet by mouth every 6 (six) hours as needed.    Dispense:  10 tablet    Refill:  0   ondansetron  (ZOFRAN ) 4 MG tablet    Sig: Take 1 tablet (4 mg total) by mouth every 6 (six) hours.    Dispense:  12 tablet    Refill:  0    -I have reviewed the patients home medicines and have made adjustments as needed   Social Determinants of Health:  Diagnosis or treatment significantly limited by social determinants of health: obesity   Reevaluation: After the  interventions noted above, I reevaluated the patient and found that their symptoms have improved  Co morbidities that complicate the patient evaluation  Past Medical History:  Diagnosis Date  Anemia    Chest pain    Hypothyroidism    Lupus    Migraine    Migraines    Subconjunctival hemorrhage sm prob from vomiting 04/28/2012   Systemic lupus (HCC)    Thyroid  disease    UTI (urinary tract infection)    Vaginal delivery 2002, 2009, 2015   Varicose veins    Left > than right      Dispostion: Disposition decision including need for hospitalization was considered, and patient discharged from emergency department.    Final Clinical Impression(s) / ED Diagnoses Final diagnoses:  Post-operative pain     This chart was dictated using voice recognition software.  Despite best efforts to proofread,  errors can occur which can change the documentation meaning.    Mordecai Applebaum, MD 02/11/24 2212

## 2024-02-11 NOTE — ED Triage Notes (Signed)
 Pt POV slow gait- reports recent cholecystectomy last Thursday, reports worsening abd pain and nausea since d/c on Sunday.   C/o RLE pain and swelling x2 days. Denies fever.   Took tramadol  appx 1000, no relief.

## 2024-02-11 NOTE — Discharge Instructions (Addendum)
 We evaluated you for your abdominal pain and your leg pain.  Your testing in the emergency department was reassuring.  We did not see signs of any postoperative complication.  We also evaluated you for a blood clot in your leg and did not see any blood clot.  Since the medicine you are taking was causing a headache, we have prescribed you Norco instead.  Please do not take Norco at the same time as oxycodone  or tramadol .  Norco also contains Tylenol  so you are taking Norco, do not take additional Tylenol .  You can still take 600 mg of ibuprofen  every 6 hours as needed for additional pain control.  Please follow-up closely with Dr. Ramiro Burly.  If you develop any new or worsening symptoms such as severe pain, vomiting, fevers, or any other new symptoms, please return to the emergency department.

## 2024-02-12 ENCOUNTER — Telehealth: Payer: Self-pay | Admitting: General Surgery

## 2024-02-12 NOTE — Telephone Encounter (Signed)
 Called patient as she was in [facility] ED yesterday for leg pain and some abdominal pain. CT scan seems consistent with small post op seroma, too small to drain or aspirate at this time. Venous duplex negative for DVT. Some mild LFT elevation which may be post-op in nature.   Patient says she is feeling better today. Since patient feeling better will arrange post-op appointment for patient sooner than originally scheduled, for Tuesday at 10:30. If symptoms recur or worsen before Tuesday she knows to call our clinic and I can see her later today if necessary.

## 2024-02-17 ENCOUNTER — Other Ambulatory Visit: Payer: Self-pay | Admitting: General Surgery

## 2024-02-17 DIAGNOSIS — Z9049 Acquired absence of other specified parts of digestive tract: Secondary | ICD-10-CM

## 2024-02-25 NOTE — Discharge Summary (Signed)
 Patient ID: Debbie [name] [id number] [birth year] 42 y.o.  Admit date: 02/05/2024 Discharge date: 02/07/24  Discharge Diagnosis S/p laparoscopic cholecystectomy by Dr. Ramiro Burly on 02/05/24 for chronic cholecystitis   Consultants None  HPI: Debbie [name] is a 43 year old female with lupus who presents with persistent right upper quadrant abdominal pain.   She has experienced right upper quadrant abdominal pain for a few months, initially intermittent but now constant for two weeks. The pain is sharp, radiates to her back, and worsens postprandially. A CT scan in the emergency room showed no abnormalities. Morphine  and oxycodone  were ineffective for pain relief, with oxycodone  causing a headache. She experiences significant nausea without vomiting. An ultrasound showed no gallstones, but she had pain during the procedure. Laboratory tests, including liver and gallbladder function, were normal.   Her medical history includes lupus, managed with Benlysta infusions every four weeks, and a previous hernia repair. She takes levothyroxine  50 mg for thyroid  issues. She avoids fast food due to lupus but recently consumed it, worsening her symptoms. She is concerned about a lupus flare due to ongoing pain and typically has low blood pressure during flares.  Procedures Dr. Ramiro Burly - 02/05/24 - Laparoscopic Cholecystectomy   Hospital Course:  Patient admitted for concerns of possible cholecystitis.  She was taken to the operating room by Dr. Ramiro Burly on 5/8 and underwent laparoscopic cholecystectomy.  Patient tolerated the procedure well.  Patient was discharged home on POD 2.  Please see MDs progress note for more details.  I was not directly involved in this patient's care and did not see the patient on day of discharge, therefore the information in this discharge summary was taken from the chart.    Allergies as of 02/07/2024       Reactions   Amoxicillin  Rash   Hydroxychloroquine Other (See  Comments)   hairloss   Trimethoprim  Other (See Comments)   Aspirin Hives   Pt states she can take Ibuprofen    Diflucan [fluconazole] Hives   Oxycodone  Hcl Other (See Comments)   Gives her severe headache   Sulfamethoxazole -trimethoprim  Dermatitis, Other (See Comments)   Bactrim  Swelling, Rash   Sulfa  Antibiotics Swelling, Rash        Medication List     STOP taking these medications    ondansetron  4 MG disintegrating tablet Commonly known as: ZOFRAN -ODT   oxyCODONE -acetaminophen  5-325 MG tablet Commonly known as: PERCOCET/ROXICET       TAKE these medications    acetaminophen  500 MG tablet Commonly known as: TYLENOL  Take 2 tablets (1,000 mg total) by mouth every 8 (eight) hours as needed. What changed:  how much to take when to take this reasons to take this   BENLYSTA IV Inject into the vein. Every 4 weeks.   cyclobenzaprine  10 MG tablet Commonly known as: FLEXERIL  Take 1 tablet (10 mg total) by mouth 3 (three) times daily as needed for muscle spasms.   hydrOXYzine  25 MG capsule Commonly known as: VISTARIL  Take 1 capsule (25 mg total) by mouth at bedtime as needed for itching. Take 1 hour before bedtime.   senna-docusate 8.6-50 MG tablet Commonly known as: Senokot-S Take 1 tablet by mouth at bedtime as needed for mild constipation.   Synthroid  150 MCG tablet Generic drug: levothyroxine  TAKE 1 TABLET(150 MCG) BY MOUTH DAILY BEFORE BREAKFAST   Trokendi  XR 25 MG Cp24 Generic drug: Topiramate  ER Take 1 capsule (25 mg total) by mouth at bedtime.   Vitamin D (Ergocalciferol) 1.25 MG (50000 UNIT)  Caps capsule Commonly known as: DRISDOL Take 50,000 Units by mouth once a week.          Follow-up Information     Haylen Bellotti, Puja Gosai, PA-C Follow up on 03/02/2024.   Specialty: General Surgery Why: 11am. Please arrive 30 minutes prior to your appointment for paperwork. Please bring a copy of your photo ID and insurance card. Contact information: [address] [place] [address] [phone]                 Signed: Alferd Igo, [facility] Surgery 02/25/2024, 3:29 PM Please see Amion for pager number during day hours 7:00am-4:30pm

## 2024-03-02 ENCOUNTER — Inpatient Hospital Stay: Admission: RE | Admit: 2024-03-02 | Source: Ambulatory Visit
